# Patient Record
Sex: Female | Born: 1955 | Race: Black or African American | Hispanic: No | Smoking: Never smoker
Health system: Southern US, Community
[De-identification: ages and names within clinical notes are randomized; demographics above are authoritative.]

## PROBLEM LIST (undated history)

## (undated) DIAGNOSIS — G40909 Epilepsy, unspecified, not intractable, without status epilepticus: Secondary | ICD-10-CM

## (undated) DIAGNOSIS — G5600 Carpal tunnel syndrome, unspecified upper limb: Secondary | ICD-10-CM

## (undated) DIAGNOSIS — F329 Major depressive disorder, single episode, unspecified: Secondary | ICD-10-CM

## (undated) DIAGNOSIS — I69359 Hemiplegia and hemiparesis following cerebral infarction affecting unspecified side: Secondary | ICD-10-CM

## (undated) DIAGNOSIS — D259 Leiomyoma of uterus, unspecified: Secondary | ICD-10-CM

## (undated) DIAGNOSIS — R269 Unspecified abnormalities of gait and mobility: Secondary | ICD-10-CM

## (undated) DIAGNOSIS — R413 Other amnesia: Secondary | ICD-10-CM

## (undated) DIAGNOSIS — F32A Depression, unspecified: Secondary | ICD-10-CM

## (undated) DIAGNOSIS — K2970 Gastritis, unspecified, without bleeding: Secondary | ICD-10-CM

## (undated) DIAGNOSIS — R569 Unspecified convulsions: Secondary | ICD-10-CM

## (undated) DIAGNOSIS — D509 Iron deficiency anemia, unspecified: Secondary | ICD-10-CM

## (undated) DIAGNOSIS — D649 Anemia, unspecified: Secondary | ICD-10-CM

## (undated) DIAGNOSIS — E785 Hyperlipidemia, unspecified: Secondary | ICD-10-CM

## (undated) DIAGNOSIS — I10 Essential (primary) hypertension: Secondary | ICD-10-CM

## (undated) DIAGNOSIS — IMO0002 Reserved for concepts with insufficient information to code with codable children: Secondary | ICD-10-CM

## (undated) DIAGNOSIS — K449 Diaphragmatic hernia without obstruction or gangrene: Secondary | ICD-10-CM

## (undated) DIAGNOSIS — I639 Cerebral infarction, unspecified: Secondary | ICD-10-CM

## (undated) HISTORY — PX: TUBAL LIGATION: SHX77

## (undated) HISTORY — PX: TRACHEOSTOMY CLOSURE: SHX458

## (undated) HISTORY — DX: Iron deficiency anemia, unspecified: D50.9

## (undated) HISTORY — DX: Major depressive disorder, single episode, unspecified: F32.9

## (undated) HISTORY — DX: Essential (primary) hypertension: I10

## (undated) HISTORY — DX: Hemiplegia and hemiparesis following cerebral infarction affecting unspecified side: I69.359

## (undated) HISTORY — DX: Carpal tunnel syndrome, unspecified upper limb: G56.00

## (undated) HISTORY — DX: Unspecified abnormalities of gait and mobility: R26.9

## (undated) HISTORY — DX: Gastritis, unspecified, without bleeding: K29.70

## (undated) HISTORY — DX: Other amnesia: R41.3

## (undated) HISTORY — PX: TRACHEOSTOMY: SUR1362

## (undated) HISTORY — DX: Depression, unspecified: F32.A

## (undated) HISTORY — DX: Hyperlipidemia, unspecified: E78.5

## (undated) HISTORY — DX: Leiomyoma of uterus, unspecified: D25.9

## (undated) HISTORY — DX: Epilepsy, unspecified, not intractable, without status epilepticus: G40.909

## (undated) HISTORY — DX: Anemia, unspecified: D64.9

## (undated) HISTORY — DX: Cerebral infarction, unspecified: I63.9

## (undated) HISTORY — DX: Diaphragmatic hernia without obstruction or gangrene: K44.9

## (undated) HISTORY — DX: Reserved for concepts with insufficient information to code with codable children: IMO0002

---
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
## 1982-04-27 HISTORY — PX: TUBAL LIGATION: SHX77

## 1988-03-27 DIAGNOSIS — I639 Cerebral infarction, unspecified: Secondary | ICD-10-CM

## 1988-03-27 HISTORY — DX: Cerebral infarction, unspecified: I63.9

## 2005-02-19 ENCOUNTER — Inpatient Hospital Stay (HOSPITAL_COMMUNITY): Admission: EM | Admit: 2005-02-19 | Discharge: 2005-02-19 | Payer: Self-pay | Admitting: Emergency Medicine

## 2006-04-27 DIAGNOSIS — D509 Iron deficiency anemia, unspecified: Secondary | ICD-10-CM

## 2006-04-27 HISTORY — PX: COLONOSCOPY: SHX174

## 2006-04-27 HISTORY — DX: Iron deficiency anemia, unspecified: D50.9

## 2006-04-27 HISTORY — PX: UPPER GASTROINTESTINAL ENDOSCOPY: SHX188

## 2006-12-01 ENCOUNTER — Ambulatory Visit: Payer: Self-pay | Admitting: Internal Medicine

## 2007-03-11 ENCOUNTER — Ambulatory Visit: Payer: Self-pay | Admitting: Internal Medicine

## 2007-03-22 ENCOUNTER — Encounter: Payer: Self-pay | Admitting: Internal Medicine

## 2007-03-22 ENCOUNTER — Ambulatory Visit (HOSPITAL_COMMUNITY): Admission: RE | Admit: 2007-03-22 | Discharge: 2007-03-22 | Payer: Self-pay | Admitting: Internal Medicine

## 2007-03-22 DIAGNOSIS — K644 Residual hemorrhoidal skin tags: Secondary | ICD-10-CM | POA: Insufficient documentation

## 2007-03-22 DIAGNOSIS — K449 Diaphragmatic hernia without obstruction or gangrene: Secondary | ICD-10-CM | POA: Insufficient documentation

## 2007-03-28 ENCOUNTER — Ambulatory Visit: Payer: Self-pay | Admitting: Internal Medicine

## 2007-03-31 ENCOUNTER — Ambulatory Visit (HOSPITAL_COMMUNITY): Admission: RE | Admit: 2007-03-31 | Discharge: 2007-03-31 | Payer: Self-pay | Admitting: Obstetrics

## 2007-06-09 DIAGNOSIS — E785 Hyperlipidemia, unspecified: Secondary | ICD-10-CM

## 2007-06-09 DIAGNOSIS — K2990 Gastroduodenitis, unspecified, without bleeding: Secondary | ICD-10-CM

## 2007-06-09 DIAGNOSIS — I10 Essential (primary) hypertension: Secondary | ICD-10-CM

## 2007-06-09 DIAGNOSIS — D500 Iron deficiency anemia secondary to blood loss (chronic): Secondary | ICD-10-CM

## 2007-06-09 DIAGNOSIS — Z8673 Personal history of transient ischemic attack (TIA), and cerebral infarction without residual deficits: Secondary | ICD-10-CM

## 2007-06-09 DIAGNOSIS — K2970 Gastritis, unspecified, without bleeding: Secondary | ICD-10-CM | POA: Insufficient documentation

## 2008-04-02 ENCOUNTER — Ambulatory Visit (HOSPITAL_COMMUNITY): Admission: RE | Admit: 2008-04-02 | Discharge: 2008-04-02 | Payer: Self-pay | Admitting: Internal Medicine

## 2009-09-25 ENCOUNTER — Ambulatory Visit (HOSPITAL_COMMUNITY): Admission: RE | Admit: 2009-09-25 | Discharge: 2009-09-25 | Payer: Self-pay | Admitting: Geriatric Medicine

## 2009-10-15 ENCOUNTER — Encounter: Admission: RE | Admit: 2009-10-15 | Discharge: 2009-10-15 | Payer: Self-pay | Admitting: Geriatric Medicine

## 2010-03-22 ENCOUNTER — Encounter: Payer: Self-pay | Admitting: Emergency Medicine

## 2010-03-23 ENCOUNTER — Emergency Department (HOSPITAL_COMMUNITY)
Admission: EM | Admit: 2010-03-23 | Discharge: 2010-03-23 | Payer: Self-pay | Source: Home / Self Care | Admitting: Emergency Medicine

## 2010-03-23 ENCOUNTER — Observation Stay (HOSPITAL_COMMUNITY)
Admission: EM | Admit: 2010-03-23 | Discharge: 2010-03-25 | Payer: Self-pay | Source: Home / Self Care | Admitting: Emergency Medicine

## 2010-05-18 ENCOUNTER — Encounter: Payer: Self-pay | Admitting: Geriatric Medicine

## 2010-05-18 ENCOUNTER — Encounter: Payer: Self-pay | Admitting: Obstetrics

## 2010-07-08 LAB — CBC
HCT: 30.5 % — ABNORMAL LOW (ref 36.0–46.0)
HCT: 33 % — ABNORMAL LOW (ref 36.0–46.0)
HCT: 36.1 % (ref 36.0–46.0)
Hemoglobin: 10.2 g/dL — ABNORMAL LOW (ref 12.0–15.0)
Hemoglobin: 10.6 g/dL — ABNORMAL LOW (ref 12.0–15.0)
Hemoglobin: 11.3 g/dL — ABNORMAL LOW (ref 12.0–15.0)
Hemoglobin: 12.3 g/dL (ref 12.0–15.0)
MCH: 31.5 pg (ref 26.0–34.0)
MCH: 32.1 pg (ref 26.0–34.0)
MCH: 32.1 pg (ref 26.0–34.0)
MCH: 32.4 pg (ref 26.0–34.0)
MCHC: 33.3 g/dL (ref 30.0–36.0)
MCHC: 34 g/dL (ref 30.0–36.0)
MCHC: 34 g/dL (ref 30.0–36.0)
MCHC: 34.1 g/dL (ref 30.0–36.0)
MCV: 94.3 fL (ref 78.0–100.0)
MCV: 94.5 fL (ref 78.0–100.0)
MCV: 94.5 fL (ref 78.0–100.0)
Platelets: ADEQUATE 10*3/uL (ref 150–400)
Platelets: ADEQUATE 10*3/uL (ref 150–400)
RBC: 3.22 MIL/uL — ABNORMAL LOW (ref 3.87–5.11)
RBC: 3.51 MIL/uL — ABNORMAL LOW (ref 3.87–5.11)
RBC: 3.82 MIL/uL — ABNORMAL LOW (ref 3.87–5.11)
RDW: 12.7 % (ref 11.5–15.5)
RDW: 12.8 % (ref 11.5–15.5)
RDW: 12.8 % (ref 11.5–15.5)
RDW: 12.9 % (ref 11.5–15.5)
WBC: 13.6 10*3/uL — ABNORMAL HIGH (ref 4.0–10.5)
WBC: 14.8 10*3/uL — ABNORMAL HIGH (ref 4.0–10.5)
WBC: 19.1 10*3/uL — ABNORMAL HIGH (ref 4.0–10.5)

## 2010-07-08 LAB — CULTURE, BLOOD (ROUTINE X 2)
Culture  Setup Time: 201111271725
Culture  Setup Time: 201111271725
Culture: NO GROWTH
Culture: NO GROWTH

## 2010-07-08 LAB — BASIC METABOLIC PANEL
BUN: 11 mg/dL (ref 6–23)
BUN: 11 mg/dL (ref 6–23)
BUN: 13 mg/dL (ref 6–23)
BUN: 9 mg/dL (ref 6–23)
CO2: 26 mEq/L (ref 19–32)
CO2: 27 mEq/L (ref 19–32)
CO2: 28 mEq/L (ref 19–32)
CO2: 29 mEq/L (ref 19–32)
Calcium: 8.5 mg/dL (ref 8.4–10.5)
Calcium: 8.5 mg/dL (ref 8.4–10.5)
Calcium: 8.7 mg/dL (ref 8.4–10.5)
Calcium: 8.9 mg/dL (ref 8.4–10.5)
Chloride: 103 mEq/L (ref 96–112)
Chloride: 103 mEq/L (ref 96–112)
Chloride: 103 mEq/L (ref 96–112)
Creatinine, Ser: 0.73 mg/dL (ref 0.4–1.2)
Creatinine, Ser: 0.79 mg/dL (ref 0.4–1.2)
Creatinine, Ser: 0.79 mg/dL (ref 0.4–1.2)
Creatinine, Ser: 0.84 mg/dL (ref 0.4–1.2)
GFR calc Af Amer: >60 mL/min (ref >60)
GFR calc Af Amer: >60 mL/min (ref >60)
GFR calc Af Amer: >60 mL/min (ref >60)
GFR calc non Af Amer: >60 mL/min (ref >60)
GFR calc non Af Amer: >60 mL/min (ref >60)
GFR calc non Af Amer: >60 mL/min (ref >60)
Glucose, Bld: 110 mg/dL — ABNORMAL HIGH (ref 70–99)
Glucose, Bld: 124 mg/dL — ABNORMAL HIGH (ref 70–99)
Glucose, Bld: 124 mg/dL — ABNORMAL HIGH (ref 70–99)
Glucose, Bld: 134 mg/dL — ABNORMAL HIGH (ref 70–99)
Potassium: 3 mEq/L — ABNORMAL LOW (ref 3.5–5.1)
Potassium: 3 mEq/L — ABNORMAL LOW (ref 3.5–5.1)
Potassium: 3.4 mEq/L — ABNORMAL LOW (ref 3.5–5.1)
Sodium: 138 mEq/L (ref 135–145)
Sodium: 138 mEq/L (ref 135–145)
Sodium: 140 mEq/L (ref 135–145)

## 2010-07-08 LAB — LIPID PANEL
Cholesterol: 115 mg/dL (ref 0–200)
HDL: 43 mg/dL (ref >39)
LDL Cholesterol: 59 mg/dL (ref 0–99)
Total CHOL/HDL Ratio: 2.7 RATIO
Triglycerides: 65 mg/dL (ref <150)

## 2010-07-08 LAB — TYPE AND SCREEN
ABO/RH(D): A NEG
Antibody Screen: NEGATIVE

## 2010-07-08 LAB — URINALYSIS, ROUTINE W REFLEX MICROSCOPIC
Bilirubin Urine: NEGATIVE
Glucose, UA: NEGATIVE mg/dL
Ketones, ur: NEGATIVE mg/dL
Leukocytes, UA: NEGATIVE
Nitrite: NEGATIVE
Protein, ur: NEGATIVE mg/dL
Specific Gravity, Urine: >1.046 — ABNORMAL HIGH (ref 1.005–1.030)
Urobilinogen, UA: 0.2 mg/dL (ref 0.0–1.0)
pH: 6 (ref 5.0–8.0)

## 2010-07-08 LAB — URINE MICROSCOPIC-ADD ON

## 2010-07-08 LAB — ABO/RH: ABO/RH(D): A NEG

## 2010-07-08 LAB — MRSA PCR SCREENING: MRSA by PCR: NEGATIVE

## 2010-09-09 NOTE — Assessment & Plan Note (Signed)
[facility]                         GASTROENTEROLOGY OFFICE NOTE   NAME:HARRISLiliani, Bobo                          MRN:          [id number]  DATE:12/01/2006                            DOB:          [birth year]    CHIEF COMPLAINT:  Iron deficiency anemia.   ASSESSMENT:  Iron deficiency anemia without clear evidence of GI  bleeding at this time. Her hemoglobin is 10.1 with an MCV of 81.  Iron  is 25 with a percent saturation of 4%, TIBC 347. Etiologies could be GI  blood loss, she has menstrual periods, and that is probably most likely.  Note that her ferritin is 7. This patient reports that she had a  colonoscopy at [facility] two years ago. We have requested  records, but they say that they have evidence that she had a colonoscopy  or an endoscopy.   The reliability of the history is not entirely clear to me. 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 She has had a stroke. She seemed to be mentally intact  otherwise however.   RECOMMENDATIONS AND PLAN:  1. A colonoscopy could be indicated, but if she really had one two      years ago it probably is not.  2. Home hemoccults are ordered. We will await the results of them to      determine. If they are positive then I do think a workup is going      to be necessary.  3. We will ask Dr. Merilynn Finland if there are records about her      colonoscopy in his office files or at the nursing home.  4. Further plans is pending the above.   HISTORY:  As above. She denies any particular symptoms. This lady was  living in a nursing home in [place] and then moved here to be closer to  her family. She had a stroke about 10 years ago. Stools have been  somewhat dark on iron. She has been a little constipated with that. She  uses omeprazole to control heartburn.   PAST MEDICAL HISTORY:  1. Hypertension.  2. Dyslipidemia.  3. Previous stroke.  4. She had  a tracheostomy at one point.  5. Tubal ligation.  6. Some sort of benign breast surgery.  7. Otherwise, as above.   FAMILY HISTORY:  See medical history form.   SOCIAL HISTORY:  See medical history form.   REVIEW OF SYSTEMS:  See medical history form.   Of note, she is divorced. She is a disabled Runner, broadcasting/film/video. She is in a  wheelchair, though she can walk some.   MEDICATIONS:  Listed and reviewed on the chart.   PHYSICAL EXAMINATION:  GENERAL:  Middle aged black woman in no acute  distress.  VITAL SIGNS:  Height 5 foot 7.5 inches, blood pressure 120/70, pulse 84.  EYES:  Anicteric.  NECK:  Supple.  CHEST:  Clear.  HEART:  S1, S2. No murmurs or gallops.  ABDOMEN:  Soft and nontender. No organomegaly or mass.  LOWER EXTREMITIES:  Free of edema. There is a brace on the left lower  extremity.  NEUROLOGIC:  She appears alert and oriented x3.   DATA REVIEWED:  As described above.   I appreciate the opportunity to care for this patient.     Iva Boop, MD,FACG  Electronically Signed    CEG/MedQ  DD: 12/03/2006  DT: 12/04/2006  Job #: [id number]   cc:   Maxwell Caul, M.D.

## 2010-09-09 NOTE — Assessment & Plan Note (Signed)
[name] [name]                         GASTROENTEROLOGY OFFICE NOTE   NAME:[name], [name]                          MRN:          [id number]  DATE:03/11/2007                            DOB:          1955-10-30    CHIEF COMPLAINT:  Reflux.   When she eats spicy foods, despite Prilosec OTC 20 mg a day she is  having persistent problems and is using some Tums as well.  I had seen  her in the summer and we had requested records of a colonoscopy.  She  says she had in [place], but we received records information stating that  there was no record of that at [facility].  She has iron-deficiency  anemia and has been on iron.  Hemoccults were not returned.  There were  no significant bowel symptoms at this time.  And, she is not complaining  of any dysphagia.   PAST MEDICAL HISTORY:  1. Hypertension.  2. Dyslipidemia.  3. Prior stroke.  She is in a wheelchair much of the time, uses a      walker, has leg braces.  4. Prior tracheostomy.  5. Prior tubal ligation.  6. Benign breast surgery.  7. Iron-deficiency anemia.  8. Still menstruating.   MEDICATIONS:  1. Dilantin 400 mg daily.  2. Aspirin 325 mg daily.  3. Maxzide 25 mg daily.  4. Tolterodine daily.  5. Omeprazole 20 mg daily.  6. Norvasc 2.5 mg daily.  7. Colace 100 mg daily.  8. Os-Cal 500 mg b.i.d.  9. Ferrous sulfate b.i.d.  10.Lipitor 40 mg daily.  11.Lotrisone cream 1% b.i.d.  12.Tums 2 b.i.d.   DRUG ALLERGIES:  1. PENICILLIN.  2. AMOXICILLIN.  3. KEFLEX.   PHYSICAL EXAMINATION:  GENERAL:  Wheelchair bound.  VITAL SIGNS:  Estimated weight 212 pounds, pulse 86, blood pressure  158/98, height 5 feet 7.5 inches.   ASSESSMENT:  1. Reflux symptoms not controlled by Prilosec and Tums, could be from      diet.  She is obese which is a risk factor as well.  2. Iron-deficiency anemia.  I do not think I have an adequate      explanation though it could be menstrual in origin without the     records of her prior colonoscopy, I cannot be adequately certain      that there is not a significant lower gastrointestinal tract      lesion.   PLAN:  1. Schedule EGD and colonoscopy and investigate the reflux and the      iron-deficiency anemia.  Risks, benefits, and indications including      perforation of the gastrointestinal tract are described as well as      possibly needing surgery.  She understands the risks and agrees to      proceed.  2. We will increase her Prilosec to 40 mg in the morning.   Further plans pending this.     Iva Boop, MD,FACG  Electronically Signed    CEG/MedQ  DD: 03/11/2007  DT: 03/11/2007  Job #: [id number]   cc:   [name].  Leanord Hawking, M.D.  [place] [facility] [employer]

## 2010-09-12 NOTE — Op Note (Signed)
NAMEZAYANA, [name] [name] NO.:  [id number]   MEDICAL RECORD NO.:  [id number]          PATIENT TYPE:  INP   LOCATION:  2550                         FACILITY:  [facility]   PHYSICIAN:  Nadara Mustard, MD     DATE OF BIRTH:  1955/09/21   DATE OF PROCEDURE:  02/19/2005  DATE OF DISCHARGE:                                 OPERATIVE REPORT   PREOP DIAGNOSIS:  Dislocated left shoulder failed closed reduction in the  ER.   POSTOP DIAGNOSIS:  Dislocated left shoulder failed closed reduction in the  ER.   PROCEDURE:  Closed reduction left shoulder in the OR.   SURGEON:  Nadara Mustard, MD   ANESTHESIA:  General.   ESTIMATED BLOOD LOSS:  Minimal.   ANTIBIOTICS:  None.   DRAINS:  None.   COMPLICATIONS:  None.   DISPOSITION:  To PACU in stable condition.   INDICATIONS FOR PROCEDURE:  The patient is a 55 year old woman who underwent  conscious sedation in the emergency room and underwent closed reduction. The  patient had failure of closed reduction and presents to the OR, at this  time, for closed reduction under general anesthesia. The patient, and her  daughter were discussed with the risks and benefits of surgery including  recurrent dislocation, neurovascular injury. The patient and her daughter  state that they understand and wish to proceed at this time.   DESCRIPTION OF PROCEDURE:  The patient was brought to OR room #1 and  underwent a general anesthetic. After an adequate level of anesthesia  obtained, the patient's left shoulder was reduced with C-arm fluoroscopy  used to verify reduction. Axillary views showed a congruent joint; AP views  showed a congruent joint. The patient was placed in a sling. She was  extubated, taken to PACU in stable condition. The patient states that she  will return to [facility] and will follow up with orthopedics in [place].      Nadara Mustard, MD  Electronically Signed    MVD/MEDQ  D:  02/19/2005  T:  02/20/2005  Job:   [id number]

## 2010-09-12 NOTE — Consult Note (Signed)
NAME:  [name] [name], [name] [name] NO.:  [id number]   MEDICAL RECORD NO.:  [id number]          PATIENT TYPE:  INP   LOCATION:  2550                         FACILITY:  [facility]   PHYSICIAN:  Nadara Mustard, MD     DATE OF BIRTH:  12-Oct-1955   DATE OF CONSULTATION:  02/19/2005  DATE OF DISCHARGE:                                   CONSULTATION   HISTORY OF PRESENT ILLNESS:  The patient is a 55 year old woman with a  history of a stroke with involvement of both upper and both lower  extremities. She currently ambulates with a AFOs and a walker. AFOs are  bilaterally. The patient states she was getting out of the shower, was  falling and grabbed with her left arm twisted and dislocated her left  shoulder. The patient's past medical history is significant for history of  seizures, stroke, which she states has involved both upper and lower  extremities, hypertension, and high cholesterol. The patient states she is  currently on medication for the seizures, high cholesterol and hypertension.   ALLERGIES:  No known drug allergies.   OBJECTIVE EXAMINATION:  The patient has inability to move the left shoulder.  She has pain with attempted internal or external rotation. Both hands are  neurovascular intact. Examination of both lower extremities:  She has AFOs  on bilaterally and has essentially no plantar flexion or dorsiflexion of  either foot and this is unchanged from her normal exam.   Radiograph shows an anterior dislocation of the left shoulder. No evidence  of a Hill-Sacks lesion.   ASSESSMENT:  Anterior shoulder dislocation, left shoulder in a patient with  a history of stroke, seizures, hypertension, and high cholesterol.   PLAN:  After informed consent, the patient underwent conscious sedation. She  received 50 milligrams of fentanyl and 5 milligrams of Versed. She then  underwent a closed reduction without difficulty. The patient had regained  her range of motion. The patient  was then given 0.5 milligrams midazolam.  She responded well. Her O2 saturations were never below 99% and she states  the shoulder felt as good as it ever did. Postreduction radiographs were  obtained. She was given a prescription for Vicodin for pain. The patient  states that she lives in [place]. She was visiting her daughter and states  that she will follow-up with orthopedics in [place]. She was placed in a  sling and nonweightbearing on the left upper extremity and she will follow  up in [place] in two weeks.      Nadara Mustard, MD  Electronically Signed     MVD/MEDQ  D:  02/19/2005  T:  02/20/2005  Job:  [id number]

## 2010-09-12 NOTE — Letter (Signed)
December 03, 2006    Maxwell Caul, M.D.  [address]  [place], [place] [address]   RE:  CLAUDENE, GATLIFF  MRN:  [id number]  /  DOB:  05/21/1953   Dear Casimiro Needle:   I saw Ms. Mckiddy in the office the other day. She has iron deficiency  anemia. She tells me that she had a colonoscopy at [facility] 2 years ago but they had no records of that when we requested  it.   If she indeed had one 2 years ago and is Hemoccult negative, I am not  sure if she needs further GI evaluation, other than an upper endoscopy,  maybe.   I am checking Hemoccult's. If they are positive, we will proceed with an  endoscopic evaluation. However, if you have any records of a  colonoscopy, endoscopy, or can help [place] figure out where she had this  (she does not seem to be a reliable historian), that would be most  useful. Thanks    Sincerely,      Iva Boop, MD,FACG  Electronically Signed    CEG/MedQ  DD: 12/03/2006  DT: 12/04/2006  Job #: [id number]

## 2011-06-22 ENCOUNTER — Encounter: Payer: Self-pay | Admitting: Internal Medicine

## 2011-07-13 ENCOUNTER — Encounter: Payer: Self-pay | Admitting: *Deleted

## 2011-07-14 ENCOUNTER — Ambulatory Visit (INDEPENDENT_AMBULATORY_CARE_PROVIDER_SITE_OTHER): Payer: PRIVATE HEALTH INSURANCE | Admitting: Internal Medicine

## 2011-07-14 ENCOUNTER — Encounter: Payer: Self-pay | Admitting: Internal Medicine

## 2011-07-14 DIAGNOSIS — K5900 Constipation, unspecified: Secondary | ICD-10-CM

## 2011-07-14 DIAGNOSIS — Z1211 Encounter for screening for malignant neoplasm of colon: Secondary | ICD-10-CM

## 2011-07-14 MED ORDER — POLYETHYLENE GLYCOL 3350 17 GM/SCOOP PO POWD: 17.0000 g | Freq: Every day | ORAL | Status: AC

## 2011-07-14 NOTE — Patient Instructions (Signed)
Start taking Miralax 17grams one dose per day.

## 2011-07-14 NOTE — Progress Notes (Signed)
Subjective:    Patient ID: [name] [name], female    DOB: 12/29/1955, 56 y.o.   MRN: [id number]  HPI This is a pleasant 56 year old African American woman, she resides in [facility] in rehabilitation. 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 I had performed a colonoscopy in 2008, the prep was fair, a repeat colonoscopy was recommended for about 5 years because of the fair prep. She moves her bowels about every 3-4 days. She does not walk much at all. She is a wheelchair today. She has no specific GI complaints today denies seeing any bleeding in the stool and we have no known history of Hemoccult-positive stools. She takes iron chronically  She believes the iron causes some constipation. Allergies  Allergen Reactions  . Amoxicillin   . Keflex   . Penicillins    No outpatient prescriptions prior to visit.   Past Medical History  Diagnosis Date  . Uterine fibroid   . Gastritis   . Hiatal hernia   . Hemorrhoids   . Anemia   . Stroke 12 1989  . Dyslipidemia   . Hypertension   . Seizure disorder   . Carpal tunnel syndrome   . Depression   . Osteoporosis   . Urinary incontinence   . Iron deficiency anemia 2008    negative EGD and colonoscopy   Past Surgical History  Procedure Date  . Colonoscopy 2008  . Upper gastrointestinal endoscopy 2008  . Tracheostomy   . Breast surgery 1977  . Tubal ligation   . Cesarean section     x2   History   Social History  . Marital Status: Divorced    Spouse Name: N/A    Number of Children: 2  . Years of Education: N/A   Occupational History  . Disabled    Social History Main Topics  . Smoking status: Never Smoker   . Smokeless tobacco: Never Used  . Alcohol Use: No  . Drug Use: No  . Sexually Active: None   Other Topics Concern  . None   Social History Narrative   Size and skilled nursing facility secondary to multiple previous strokes and lack of family to care for her at home   Family History  Problem Relation Age of  Onset  . Heart disease Paternal Grandfather   . Prostate cancer Paternal Uncle   . Ovarian cancer Mother   . Diabetes Cousin   . Colon cancer Neg Hx         Review of Systems Positive for decreased hearing, muscle and joint pains, pedal edema.    Objective:   Physical Exam General:  NAD - obese, in wheelchair Eyes:   anicteric Lungs:  clear Heart:  S1S2 no rubs, murmurs or gallops Abdomen:  Soft, obese, nontender Ext:   Ankle splints bilaterally Psych:  Appropriate mood and affect    Data Reviewed:   November 2008 EGD showed a 5 cm hiatal hernia and mild gastritis. Colonoscopy then showed external hemorrhoids. Extensive flushing was required to achieve a fair prep.        Assessment & Plan:   1. Constipation   2. Special screening for malignant neoplasms, colon    She has chronic constipation issues, not surprising given her debilitated state and medications. I am going to have her start MiraLax daily in addition to the other agents she takes. If she were to have a repeat colonoscopy she should have a better bowel habit. I will have her come back in  2 months to review options again, we did discuss Hemoccult testing on an annual basis versus colonoscopy. We'll see if we can have her get a colonoscopy. It will have to be performed at the hospital due to her comorbidities and the debilitated state. We did review the risks benefits and indications today and she is willing to consider having one. We also talked about annual Hemoccult testing. I want to make sure she has a better prep if we do at this time. I had originally planned to do it on a routine basis if health status permitted around November 2013.  I will copy Dr. Murray Hodgkins, and [facility]

## 2011-10-14 ENCOUNTER — Emergency Department (HOSPITAL_COMMUNITY)
Admission: EM | Admit: 2011-10-14 | Discharge: 2011-10-14 | Disposition: A | Discharge status: Home / Self Care | Payer: Medicare PPO | Attending: Emergency Medicine | Admitting: Emergency Medicine

## 2011-10-14 ENCOUNTER — Encounter (HOSPITAL_COMMUNITY): Payer: Self-pay | Admitting: *Deleted

## 2011-10-14 DIAGNOSIS — Z8673 Personal history of transient ischemic attack (TIA), and cerebral infarction without residual deficits: Secondary | ICD-10-CM | POA: Insufficient documentation

## 2011-10-14 DIAGNOSIS — E785 Hyperlipidemia, unspecified: Secondary | ICD-10-CM | POA: Insufficient documentation

## 2011-10-14 DIAGNOSIS — G40909 Epilepsy, unspecified, not intractable, without status epilepticus: Secondary | ICD-10-CM | POA: Insufficient documentation

## 2011-10-14 DIAGNOSIS — I10 Essential (primary) hypertension: Secondary | ICD-10-CM | POA: Insufficient documentation

## 2011-10-14 DIAGNOSIS — Z79899 Other long term (current) drug therapy: Secondary | ICD-10-CM | POA: Insufficient documentation

## 2011-10-14 DIAGNOSIS — R569 Unspecified convulsions: Secondary | ICD-10-CM

## 2011-10-14 MED ORDER — LEVETIRACETAM 500 MG/5ML IV SOLN
500.0000 mg | Freq: Once | INTRAVENOUS | Status: DC
  Filled 2011-10-14: qty 5

## 2011-10-14 MED ORDER — ONDANSETRON 8 MG PO TBDP
8.0000 mg | ORAL_TABLET | Freq: Once | ORAL | Status: AC
  Administered 2011-10-14: 8 mg via ORAL

## 2011-10-14 MED ORDER — ONDANSETRON 8 MG PO TBDP
ORAL_TABLET | ORAL | Status: AC
  Administered 2011-10-14: 8 mg via ORAL
  Filled 2011-10-14: qty 1

## 2011-10-14 MED ORDER — LEVETIRACETAM 500 MG PO TABS: 500.0000 mg | ORAL_TABLET | Freq: Two times a day (BID) | ORAL | Status: DC

## 2011-10-14 MED ORDER — LEVETIRACETAM 500 MG PO TABS
500.0000 mg | ORAL_TABLET | Freq: Once | ORAL | Status: AC
  Administered 2011-10-14: 500 mg via ORAL
  Filled 2011-10-14: qty 1

## 2011-10-14 MED ORDER — FENTANYL CITRATE 0.05 MG/ML IJ SOLN
INTRAMUSCULAR | Status: AC
  Filled 2011-10-14: qty 2

## 2011-10-14 NOTE — ED Notes (Signed)
Per EMS:  Pt had a witness seizure lasting 30-45 minutes.  Pt is a pt in a rehab facility and they adjusted her meds, she was on Keppra and they took her off.  This is her 2nd seizure since June 1st, she has been off the Keppra since before June 1st.  Alert and oriented x4.  Seizure took place in bed, pt didn't fall, no other injuries or complaints.

## 2011-10-14 NOTE — ED Provider Notes (Signed)
History     CSN: [id number]  Arrival date & time 10/14/11  0144   First MD Initiated Contact with Patient 10/14/11 [phone]      Chief Complaint  Patient presents with  . Seizure     (Consider location/radiation/quality/duration/timing/severity/associated sxs/prior treatment) HPI This is a 56 year old black female with a history of stroke and seizure disorder. She was taken off her Keppra sometime before this month. Since then she has had 2 seizures including the seizure she had this morning. Specifically she had a generalized tonic-clonic seizure lasting about 30-45 minutes. This occurred just after midnight. She denies biting her tongue. She was not incontinent. She states she feels good but somewhat sleepy. She does not know why she was taken off Keppra. She did not have seizures prior to her stroke. 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  Past Medical History  Diagnosis Date  . Uterine fibroid   . Gastritis   . Hiatal hernia   . Hemorrhoids   . Anemia   . Stroke 12 1989  . Dyslipidemia   . Hypertension   . Seizure disorder   . Carpal tunnel syndrome   . Depression   . Osteoporosis   . Urinary incontinence   . Iron deficiency anemia 2008    negative EGD and colonoscopy    Past Surgical History  Procedure Date  . Colonoscopy 2008  . Upper gastrointestinal endoscopy 2008  . Tracheostomy   . Breast surgery 1977  . Tubal ligation   . Cesarean section     x2    Family History  Problem Relation Age of Onset  . Heart disease Paternal Grandfather   . Prostate cancer Paternal Uncle   . Ovarian cancer Mother   . Diabetes Cousin   . Colon cancer Neg Hx     History  Substance Use Topics  . Smoking status: Never Smoker   . Smokeless tobacco: Never Used  . Alcohol Use: No    OB History    Grav Para Term Preterm Abortions TAB SAB Ect Mult Living                  Review of Systems  All other systems reviewed and are negative.    Allergies  Amoxicillin;  Cephalexin; and Penicillins  Home Medications   Current Outpatient Rx  Name Route Sig Dispense Refill  . ASPIRIN EC 81 MG PO TBEC Oral Take 81 mg by mouth daily.    . ATENOLOL 25 MG PO TABS Oral Take 12.5 mg by mouth daily.    . ATORVASTATIN CALCIUM 40 MG PO TABS Oral Take 40 mg by mouth daily.    Marland Kitchen LISINOPRIL 10 MG PO TABS Oral Take 10 mg by mouth daily.    Marland Kitchen METFORMIN HCL 500 MG PO TABS Oral Take 500 mg by mouth daily.    Marland Kitchen OMEPRAZOLE 40 MG PO CPDR Oral Take 40 mg by mouth daily.    . TRIAMTERENE-HCTZ 37.5-25 MG PO TABS Oral Take 1.5 tablets by mouth daily.      BP 96/45  Pulse 88  Temp 98.3 F (36.8 C) (Oral)  Resp 18  SpO2 87%  Physical Exam General: Well-developed, well-nourished female in no acute distress; appearance consistent with age of record HENT: normocephalic, atraumatic; no bite marks of the Eyes: pupils equal round and reactive to light; extraocular muscles intact Neck: supple Heart: regular rate and rhythm Lungs: clear to auscultation bilaterally Abdomen: soft; nondistended Extremities: No deformity; limited range of motion of  lower extremity Neurologic: Awake, alert and oriented; paraparesis; no facial droop Skin: Warm and dry Psychiatric: Normal mood and affect    ED Course  Procedures (including critical care time)     MDM  We'll restart Keppra.         Carlisle Beers Elchanan Bob, MD 10/14/11 0600

## 2011-10-14 NOTE — ED Notes (Signed)
Lauren (pt's daughter) :  [phone]

## 2011-10-14 NOTE — ED Notes (Signed)
Pt lives with daughter, her primary care giver.  Pt was had residual deficits from a previous stroke about 20 years ago.  Pt was falling and sent to an assisted living/rehab center.  Apparently while there pt was taken off her Keppra, unbeknownst to pt and pt's daughter.  Ever since then pt has had 2 seizures.

## 2011-10-14 NOTE — ED Notes (Signed)
Attempted to obtain IV access, unsuccessful.  Will attempt again shortly.

## 2011-10-14 NOTE — ED Notes (Signed)
Pt's 02 dropped to 87 on RA, placed pt on 2L [place].

## 2011-10-14 NOTE — ED Notes (Signed)
[id number]<AV> Expected date:<BR> Expected time:<BR> Means of arrival:<BR> Comments:<BR> EMS-seizure

## 2011-11-12 ENCOUNTER — Encounter: Payer: Medicare PPO | Attending: Family Medicine | Admitting: *Deleted

## 2011-11-12 ENCOUNTER — Encounter: Payer: Self-pay | Admitting: *Deleted

## 2011-11-12 DIAGNOSIS — E119 Type 2 diabetes mellitus without complications: Secondary | ICD-10-CM | POA: Insufficient documentation

## 2011-11-12 DIAGNOSIS — Z713 Dietary counseling and surveillance: Secondary | ICD-10-CM | POA: Insufficient documentation

## 2011-11-12 NOTE — Patient Instructions (Signed)
Goals:  Follow Diabetes Meal Plan as instructed  Eat 3 meals and 2 snacks, every 3-5 hrs  Limit carbohydrate intake to 30-45 grams carbohydrate/meal  Limit carbohydrate intake to 0-15 grams carbohydrate/snack  Add lean protein foods to meals/snacks  Monitor glucose levels as instructed by your doctor  Aim for 15 mins of physical activity daily as tolerated; armchair exercises and leg stretches while in bed  Bring food record and glucose log to your next nutrition visit

## 2011-11-12 NOTE — Progress Notes (Signed)
  Medical Nutrition Therapy:  Appt start time: 1500 end time:  1600.  Assessment:  Primary concerns today: patient here for diabetes education. She states she was diagnosed about 2 years ago but has not yet received diabetes education. She states she is currently living with her daughter and 54 year old grandson after being discharged from Nursing Home in [place]. She eats the breakfast her daughter prepares and frozen meals for lunch and supper. She enjoys fruit and yogurt for snacks. She is wheel chair bound so no physical exercise is possible, she states she can do weights with her arms and leg lifts when in the bed. She tests her BG when she has strips once a day   MEDICATIONS: see list. Diabetes medication in Metformin   DIETARY INTAKE:  Usual eating pattern includes 3 meals and 0-2 snacks per day.  Everyday foods include frozen meals and simple breakfast foods.  Avoided foods include: none stated.    24-hr recall:  B ( AM): fresh fruit or yogurt  Snk ( AM): eggs, pancakes, coffee  L ( PM): frozen dinner Snk ( PM): fresh fruit or yogurt D ( PM): frozen dinner Snk ( PM): none Beverages: coffee, water, occasional fruit punch or tea with sweetener  Usual physical activity: arm chair exercises with weights  Estimated energy needs: 1400 calories 158 g carbohydrates 105 g protein 39 g fat  Progress Towards Goal(s):  In progress.   Nutritional Diagnosis:  NB-1.1 Food and nutrition-related knowledge deficit As related to diabetes.  As evidenced by no previous diabetes education.  Current A1c = 6.6% on 11/05/2011    Intervention:  Nutrition counseling and diabetes education provided. Discussed basic physiology of diabetes, SMBG and rationale of checking BG at alternate times of day, A1c, Carb Counting and reading food labels, and benefits of increased activity. Also provided information on ReliOn Meter at [facility] that has less expensive strips if she has trouble with getting them  through her insurance company.  Handouts given during visit include: Living Well with Diabetes Carb Counting and Food Label handouts Meal Plan Card ReliOn Meter and Strip handout  Monitoring/Evaluation:  Dietary intake, exercise, reading food labels, and body weight in 4 week(s). Patient to call and make appointment once she checks her daughter's schedule.

## 2011-12-15 ENCOUNTER — Other Ambulatory Visit: Payer: Self-pay | Admitting: Obstetrics

## 2011-12-15 DIAGNOSIS — N920 Excessive and frequent menstruation with regular cycle: Secondary | ICD-10-CM

## 2011-12-23 ENCOUNTER — Ambulatory Visit (HOSPITAL_COMMUNITY)
Admission: RE | Admit: 2011-12-23 | Discharge: 2011-12-23 | Disposition: A | Discharge status: Home / Self Care | Payer: PRIVATE HEALTH INSURANCE | Source: Ambulatory Visit | Attending: Obstetrics | Admitting: Obstetrics

## 2011-12-23 DIAGNOSIS — D259 Leiomyoma of uterus, unspecified: Secondary | ICD-10-CM | POA: Insufficient documentation

## 2011-12-23 DIAGNOSIS — N920 Excessive and frequent menstruation with regular cycle: Secondary | ICD-10-CM | POA: Insufficient documentation

## 2012-02-25 ENCOUNTER — Encounter: Payer: Self-pay | Admitting: Internal Medicine

## 2012-03-23 ENCOUNTER — Other Ambulatory Visit: Payer: Self-pay | Admitting: Family Medicine

## 2012-03-23 DIAGNOSIS — M25511 Pain in right shoulder: Secondary | ICD-10-CM

## 2012-04-01 ENCOUNTER — Other Ambulatory Visit: Payer: PRIVATE HEALTH INSURANCE

## 2012-04-22 ENCOUNTER — Ambulatory Visit
Admission: RE | Admit: 2012-04-22 | Discharge: 2012-04-22 | Disposition: A | Discharge status: Home / Self Care | Payer: Medicare PPO | Source: Ambulatory Visit | Attending: Family Medicine | Admitting: Family Medicine

## 2012-04-22 DIAGNOSIS — M25511 Pain in right shoulder: Secondary | ICD-10-CM

## 2012-09-05 ENCOUNTER — Encounter: Payer: Self-pay | Admitting: Internal Medicine

## 2013-06-10 ENCOUNTER — Emergency Department (HOSPITAL_COMMUNITY): Payer: Medicare HMO

## 2013-06-10 ENCOUNTER — Emergency Department (HOSPITAL_COMMUNITY)
Admission: EM | Admit: 2013-06-10 | Discharge: 2013-06-10 | Disposition: A | Discharge status: Home / Self Care | Payer: Medicare HMO | Attending: Emergency Medicine | Admitting: Emergency Medicine

## 2013-06-10 ENCOUNTER — Encounter (HOSPITAL_COMMUNITY): Payer: Self-pay | Admitting: *Deleted

## 2013-06-10 DIAGNOSIS — E785 Hyperlipidemia, unspecified: Secondary | ICD-10-CM | POA: Insufficient documentation

## 2013-06-10 DIAGNOSIS — Z862 Personal history of diseases of the blood and blood-forming organs and certain disorders involving the immune mechanism: Secondary | ICD-10-CM | POA: Insufficient documentation

## 2013-06-10 DIAGNOSIS — G40909 Epilepsy, unspecified, not intractable, without status epilepticus: Secondary | ICD-10-CM | POA: Insufficient documentation

## 2013-06-10 DIAGNOSIS — Z8739 Personal history of other diseases of the musculoskeletal system and connective tissue: Secondary | ICD-10-CM | POA: Insufficient documentation

## 2013-06-10 DIAGNOSIS — Z8719 Personal history of other diseases of the digestive system: Secondary | ICD-10-CM | POA: Insufficient documentation

## 2013-06-10 DIAGNOSIS — Z880 Allergy status to penicillin: Secondary | ICD-10-CM | POA: Insufficient documentation

## 2013-06-10 DIAGNOSIS — Z8673 Personal history of transient ischemic attack (TIA), and cerebral infarction without residual deficits: Secondary | ICD-10-CM | POA: Insufficient documentation

## 2013-06-10 DIAGNOSIS — I10 Essential (primary) hypertension: Secondary | ICD-10-CM | POA: Insufficient documentation

## 2013-06-10 DIAGNOSIS — Z7982 Long term (current) use of aspirin: Secondary | ICD-10-CM | POA: Insufficient documentation

## 2013-06-10 DIAGNOSIS — Z8742 Personal history of other diseases of the female genital tract: Secondary | ICD-10-CM | POA: Insufficient documentation

## 2013-06-10 DIAGNOSIS — Z79899 Other long term (current) drug therapy: Secondary | ICD-10-CM | POA: Insufficient documentation

## 2013-06-10 DIAGNOSIS — E119 Type 2 diabetes mellitus without complications: Secondary | ICD-10-CM | POA: Insufficient documentation

## 2013-06-10 DIAGNOSIS — R569 Unspecified convulsions: Secondary | ICD-10-CM

## 2013-06-10 DIAGNOSIS — Z8659 Personal history of other mental and behavioral disorders: Secondary | ICD-10-CM | POA: Insufficient documentation

## 2013-06-10 LAB — COMPREHENSIVE METABOLIC PANEL
ALBUMIN: 3.2 g/dL — AB (ref 3.5–5.2)
ALT: 16 U/L (ref 0–35)
AST: 20 U/L (ref 0–37)
Alkaline Phosphatase: 74 U/L (ref 39–117)
BILIRUBIN TOTAL: 0.3 mg/dL (ref 0.3–1.2)
BUN: 12 mg/dL (ref 6–23)
CHLORIDE: 101 meq/L (ref 96–112)
CO2: 24 mEq/L (ref 19–32)
CREATININE: 0.51 mg/dL (ref 0.50–1.10)
Calcium: 8.9 mg/dL (ref 8.4–10.5)
GFR calc Af Amer: >90 mL/min (ref >90)
GFR calc non Af Amer: >90 mL/min (ref >90)
Glucose, Bld: 98 mg/dL (ref 70–99)
Potassium: 3.3 mEq/L — ABNORMAL LOW (ref 3.7–5.3)
Sodium: 138 mEq/L (ref 137–147)
TOTAL PROTEIN: 7.5 g/dL (ref 6.0–8.3)

## 2013-06-10 LAB — CBC
HCT: 30.1 % — ABNORMAL LOW (ref 36.0–46.0)
Hemoglobin: 9.6 g/dL — ABNORMAL LOW (ref 12.0–15.0)
MCH: 24.4 pg — AB (ref 26.0–34.0)
MCHC: 31.9 g/dL (ref 30.0–36.0)
MCV: 76.4 fL — ABNORMAL LOW (ref 78.0–100.0)
Platelets: DECREASED 10*3/uL (ref 150–400)
RBC: 3.94 MIL/uL (ref 3.87–5.11)
RDW: 19.5 % — AB (ref 11.5–15.5)
WBC: 6 10*3/uL (ref 4.0–10.5)

## 2013-06-10 LAB — TROPONIN I

## 2013-06-10 LAB — POCT I-STAT, CHEM 8
BUN: 10 mg/dL (ref 6–23)
CHLORIDE: 105 meq/L (ref 96–112)
CREATININE: 0.6 mg/dL (ref 0.50–1.10)
Calcium, Ion: 1.16 mmol/L (ref 1.12–1.23)
Glucose, Bld: 99 mg/dL (ref 70–99)
HCT: 33 % — ABNORMAL LOW (ref 36.0–46.0)
Hemoglobin: 11.2 g/dL — ABNORMAL LOW (ref 12.0–15.0)
Potassium: 3.2 mEq/L — ABNORMAL LOW (ref 3.7–5.3)
Sodium: 140 mEq/L (ref 137–147)
TCO2: 25 mmol/L (ref 0–100)

## 2013-06-10 LAB — DIFFERENTIAL
BASOS ABS: 0 10*3/uL (ref 0.0–0.1)
BASOS PCT: 0 % (ref 0–1)
EOS ABS: 0.1 10*3/uL (ref 0.0–0.7)
Eosinophils Relative: 2 % (ref 0–5)
Lymphocytes Relative: 31 % (ref 12–46)
Lymphs Abs: 1.9 10*3/uL (ref 0.7–4.0)
Monocytes Absolute: 0.2 10*3/uL (ref 0.1–1.0)
Monocytes Relative: 3 % (ref 3–12)
NEUTROS PCT: 63 % (ref 43–77)
Neutro Abs: 3.8 10*3/uL (ref 1.7–7.7)

## 2013-06-10 LAB — APTT: APTT: 34 s (ref 24–37)

## 2013-06-10 LAB — PROTIME-INR
INR: 1.02 (ref 0.00–1.49)
PROTHROMBIN TIME: 13.2 s (ref 11.6–15.2)

## 2013-06-10 LAB — GLUCOSE, CAPILLARY: GLUCOSE-CAPILLARY: 89 mg/dL (ref 70–99)

## 2013-06-10 LAB — POCT I-STAT TROPONIN I: TROPONIN I, POC: 0 ng/mL (ref 0.00–0.08)

## 2013-06-10 MED ORDER — SODIUM CHLORIDE 0.9 % IV SOLN
1000.0000 mg | Freq: Once | INTRAVENOUS | Status: AC
  Administered 2013-06-10: 1000 mg via INTRAVENOUS
  Filled 2013-06-10: qty 10

## 2013-06-10 MED ORDER — LEVETIRACETAM 500 MG PO TABS: 500.0000 mg | ORAL_TABLET | Freq: Two times a day (BID) | ORAL | Status: DC

## 2013-06-10 NOTE — ED Notes (Signed)
Case Management at bedside.

## 2013-06-10 NOTE — ED Notes (Addendum)
Dr. Doy Mince cancelled code stroke due to seizure, RR RN did not complete NIH stroke scale.

## 2013-06-10 NOTE — ED Notes (Signed)
Code Stroke called by EMS. Pt. Daughter witness a sudden change in pt at 12:30. States pt had expressive aphasia and dysphagia. HX of prev stroke, no deficits, lack of coordination on right side, epilepsy.  Vitals BP 190/120           Hr 88            cbg 100  During the CT at 13:30 pt states that she had a seizure prior to this episode.

## 2013-06-10 NOTE — Discharge Instructions (Signed)
Follow up with your md in 1-2 weeks. °

## 2013-06-10 NOTE — Progress Notes (Signed)
Case Manager met patient /daughter for discharge assistance.Patient reports she resides with her daughter / grandson and that her daughter helps with her activities of daily living. Patient reports she used to have [facility] for a nurse aid and this was helpful.CHOICE list explained .Patient elects to use [facility].This CM - Provided information on [facility].PT and RN home health services will be ordered.Patient reports medication non compliance secondary to not getting to see Her PCP.This CM will provide a list of local Primary care providers.Patients daughter updated re plan of care.CM spoke with Altha [facility] intake re new patient referral This CM will fax home health orders to [facility].

## 2013-06-10 NOTE — ED Notes (Signed)
MD at bedside.Neurologist

## 2013-06-10 NOTE — Progress Notes (Signed)
Patient demographics verified.Home health orders faxed to [facility].This CM called [facility] and spoke with Fritz Pickerel who verifies patient orders have been received.

## 2013-06-10 NOTE — Consult Note (Signed)
Referring Physician: Roderic Palau    Chief Complaint: Seizure  HPI: [name] [name] is an 58 y.o. female with a history of stroke and seizures who reports that today she had the sensation that she was about to have a seizure.  She does have an aura prior to her seizures.  No one was around to warn at the time.  She then had a seizure.  Had resultant weakness afterward.  This sudden change was noted by the daughter and patient was also unable to speak.  EMS was called and the patient was brought in as a code stroke.  The patient reports that she has a seizure about every two months.  Her last seizure was about 2 weeks ago.  She reports that she has been out of medication for months.    At baseline the patient reports that she is nonambulatory due to her strokes in the past.  Date last known well: Date: 06/10/2013 Time last known well: Time: 12:30 tPA Given: No: Not felt to be a stroke  Past Medical History  Diagnosis Date  . Uterine fibroid   . Gastritis   . Hiatal hernia   . Hemorrhoids   . Anemia   . Stroke 12 1989  . Dyslipidemia   . Hypertension   . Seizure disorder   . Carpal tunnel syndrome   . Depression   . Osteoporosis   . Urinary incontinence   . Iron deficiency anemia 2008    negative EGD and colonoscopy  . Diabetes mellitus     Past Surgical History  Procedure Laterality Date  . Colonoscopy  2008  . Upper gastrointestinal endoscopy  2008  . Tracheostomy    . Breast surgery  1977  . Tubal ligation    . Cesarean section      x2    Family History  Problem Relation Age of Onset  . Heart disease Paternal Grandfather   . Prostate cancer Paternal Uncle   . Ovarian cancer Mother   . Diabetes Cousin   . Colon cancer Neg Hx    Social History:  reports that she has never smoked. She has never used smokeless tobacco. She reports that she does not drink alcohol or use illicit drugs.  Allergies:  Allergies  Allergen Reactions  . Amoxicillin   . Cephalexin   . Penicillins      Medications: I have reviewed the patient's current medications. Prior to Admission:   Current outpatient prescriptions:aspirin 325 MG tablet, Take 325 mg by mouth daily., Disp: , Rfl: ;  aspirin EC 81 MG tablet, Take 81 mg by mouth daily., Disp: , Rfl: ;  atenolol (TENORMIN) 25 MG tablet, Take 12.5 mg by mouth daily., Disp: , Rfl: ;  atorvastatin (LIPITOR) 40 MG tablet, Take 40 mg by mouth daily., Disp: , Rfl:  levETIRAcetam (KEPPRA) 500 MG tablet, Take 1 tablet (500 mg total) by mouth every 12 (twelve) hours., Disp: 30 tablet, Rfl: 0;  lisinopril (PRINIVIL,ZESTRIL) 10 MG tablet, Take 10 mg by mouth daily., Disp: , Rfl: ;  metFORMIN (GLUCOPHAGE) 500 MG tablet, Take 500 mg by mouth daily., Disp: , Rfl: ;  omeprazole (PRILOSEC) 40 MG capsule, Take 40 mg by mouth daily., Disp: , Rfl:  triamterene-hydrochlorothiazide (MAXZIDE-25) 37.5-25 MG per tablet, Take 1.5 tablets by mouth daily., Disp: , Rfl:   ROS: History obtained from the patient  General ROS: negative for - chills, fatigue, fever, night sweats, weight gain or weight loss Psychological ROS: negative for - behavioral disorder, hallucinations, memory  difficulties, mood swings or suicidal ideation Ophthalmic ROS: negative for - blurry vision, double vision, eye pain or loss of vision ENT ROS: negative for - epistaxis, nasal discharge, oral lesions, sore throat, tinnitus or vertigo Allergy and Immunology ROS: negative for - hives or itchy/watery eyes Hematological and Lymphatic ROS: negative for - bleeding problems, bruising or swollen lymph nodes Endocrine ROS: negative for - galactorrhea, hair pattern changes, polydipsia/polyuria or temperature intolerance Respiratory ROS: negative for - cough, hemoptysis, shortness of breath or wheezing Cardiovascular ROS: negative for - chest pain, dyspnea on exertion, edema or irregular heartbeat Gastrointestinal ROS: negative for - abdominal pain, diarrhea, hematemesis, nausea/vomiting or stool  incontinence Genito-Urinary ROS: negative for - dysuria, hematuria, incontinence or urinary frequency/urgency Musculoskeletal ROS: negative for - joint swelling or muscular weakness Neurological ROS: as noted in HPI Dermatological ROS: negative for rash and skin lesion changes  Physical Examination: Last menstrual period 06/10/2013.  Neurologic Examination: Mental Status: Alert, oriented, thought content appropriate.  Speech fluent without evidence of aphasia.  Able to follow 3 step commands without difficulty. Cranial Nerves: II: Discs flat bilaterally; Visual fields grossly normal, pupils equal, round, reactive to light and accommodation III,IV, VI: ptosis not present, extra-ocular motions intact bilaterally V,VII: smile symmetric, facial light touch sensation normal bilaterally VIII: hearing normal bilaterally IX,X: gag reflex present XI: bilateral shoulder shrug XII: midline tongue extension Motor: 5/5 in the bilateral upper extremity.  Able to lift the left leg off the bed.  Only able to lift the right leg about an inch off the bed.  Increased tone in both lower extremities.    Sensory: Pinprick and light touch intact throughout, bilaterally Deep Tendon Reflexes: 1+ in the upper extremities and absent in the lower extremities Plantars: Right: upgoing   Left: upgoing Cerebellar: normal finger-to-nose.  Unable to perform heel to shin. Gait: Unable to ambulate CV: pulses palpable throughout     Laboratory Studies:  Basic Metabolic Panel:  Recent Labs Lab 06/10/13 1335  NA 140  K 3.2*  CL 105  GLUCOSE 99  BUN 10  CREATININE 0.60    Liver Function Tests: No results found for this basename: AST, ALT, ALKPHOS, BILITOT, PROT, ALBUMIN,  in the last 168 hours No results found for this basename: LIPASE, AMYLASE,  in the last 168 hours No results found for this basename: AMMONIA,  in the last 168 hours  CBC:  Recent Labs Lab 06/10/13 1329 06/10/13 1335  WBC PENDING   --   NEUTROABS 3.8  --   HGB 9.6* 11.2*  HCT 30.1* 33.0*  MCV 76.4*  --   PLT PENDING  --     Cardiac Enzymes: No results found for this basename: CKTOTAL, CKMB, CKMBINDEX, TROPONINI,  in the last 168 hours  BNP: No components found with this basename: POCBNP,   CBG:  Recent Labs Lab 06/10/13 1340  GLUCAP 89    Microbiology: Results for orders placed during the hospital encounter of 03/23/10  CULTURE, BLOOD (ROUTINE X 2)     Status: None   Collection Time    03/23/10 11:15 AM      Result Value Ref Range Status   Specimen Description BLOOD RIGHT ARM   Final   Special Requests     Final   Value: BOTTLES DRAWN AEROBIC AND ANAEROBIC 3CC IN BOTH BOTTLES   Culture  Setup Time [id number]   Final   Culture NO GROWTH 5 DAYS   Final   Report Status 03/29/2010 FINAL   Final  CULTURE, BLOOD (ROUTINE X 2)     Status: None   Collection Time    03/23/10 11:30 AM      Result Value Ref Range Status   Specimen Description BLOOD LEFT ARM   Final   Special Requests     Final   Value: BOTTLES DRAWN AEROBIC AND ANAEROBIC 5CC IN BOTH BOTTLES   Culture  Setup Time [id number]   Final   Culture NO GROWTH 5 DAYS   Final   Report Status 03/29/2010 FINAL   Final  MRSA PCR SCREENING     Status: None   Collection Time    03/23/10  3:09 PM      Result Value Ref Range Status   MRSA by PCR    NEGATIVE Final   Value: NEGATIVE            The GeneXpert MRSA Assay (FDA     approved for NASAL specimens     only), is one component of a     comprehensive MRSA colonization     surveillance program. It is not     intended to diagnose MRSA     infection nor to guide or     monitor treatment for     MRSA infections.    Coagulation Studies: No results found for this basename: LABPROT, INR,  in the last 72 hours  Urinalysis: No results found for this basename: COLORURINE, APPERANCEUR, LABSPEC, PHURINE, GLUCOSEU, HGBUR, BILIRUBINUR, KETONESUR, PROTEINUR, UROBILINOGEN, NITRITE, LEUKOCYTESUR,  in  the last 168 hours  Lipid Panel:    Component Value Date/Time   CHOL  Value: 115        ATP III CLASSIFICATION:  <200     mg/dL   Desirable  200-239  mg/dL   Borderline High  >=240    mg/dL   High        03/24/2010 0519   TRIG 65 03/24/2010 0519   HDL 43 03/24/2010 0519   CHOLHDL 2.7 03/24/2010 0519   VLDL 13 03/24/2010 0519   LDLCALC  Value: 59        Total Cholesterol/HDL:CHD Risk Coronary Heart Disease Risk Table                     Men   Women  1/2 Average Risk   3.4   3.3  Average Risk       5.0   4.4  2 X Average Risk   9.6   7.1  3 X Average Risk  23.4   11.0        Use the calculated Patient Ratio above and the CHD Risk Table to determine the patient's CHD Risk.        ATP III CLASSIFICATION (LDL):  <100     mg/dL   Optimal  100-129  mg/dL   Near or Above                    Optimal  130-159  mg/dL   Borderline  160-189  mg/dL   High  >190     mg/dL   Very High 03/24/2010 0519    HgbA1C:  No results found for this basename: HGBA1C    Urine Drug Screen:   No results found for this basename: labopia,  cocainscrnur,  labbenz,  amphetmu,  thcu,  labbarb    Alcohol Level: No results found for this basename: ETH,  in the last 168 hours   Imaging: Ct Head (brain) Wo Contrast  06/10/2013   CLINICAL DATA:  Code stroke.  Right-sided weakness.  Seizure.  EXAM: CT HEAD WITHOUT CONTRAST  TECHNIQUE: Contiguous axial images were obtained from the base of the skull through the vertex without contrast.  COMPARISON:  12/18/2008.  FINDINGS: Marked bifrontal encephalomalacia likely related to old trauma or possible vasospasm with bilateral anterior cerebral artery infarcts. No acute stroke or hemorrhage. No mass lesion or extra-axial fluid. 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 Bilateral cerebellar atrophy. Calvarium intact. No sinus or mastoid disease. Similar appearance to priors.  IMPRESSION: Chronic changes as described.  No acute intracranial findings.  Critical Value/emergent  results were called by telephone at the time of interpretation on 06/10/2013 at 1:48 PM to Dr. Roderic Palau , who verbally acknowledged these results.   Electronically Signed   By: Rolla Flatten M.D.   On: 06/10/2013 13:49    Assessment: 58 y.o. female with a history of stroke and seizures.  Reports having a seizure today and is quickly returning to baseline.  Reports noncompliance.  Head CT reviewed and shows no acute changes.    Stroke Risk Factors - diabetes mellitus, hyperlipidemia and hypertension  Plan: 1. Continue ASA 2.  Keppra 1000mg  IV now 3.  To continue maintenance of 500mg  BID.  Patient at baseline.  This may be given po to continue on an outpatient basis.    Case discussed with Dr. Pennelope Bracken, MD [facility] [phone] 06/10/2013, 1:58 PM

## 2013-06-10 NOTE — ED Notes (Addendum)
Pt. States she has not taken meds (Keppra) for months.

## 2013-06-10 NOTE — ED Notes (Signed)
MD at bedside. 
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
## 2013-06-11 NOTE — ED Provider Notes (Signed)
CSN: [id number]     Arrival date & time 06/10/13  1322 History   First MD Initiated Contact with Patient 06/10/13 1326     Chief Complaint  Patient presents with  . Code Stroke  . Seizures     (Consider location/radiation/quality/duration/timing/severity/associated sxs/prior Treatment) Patient is a 58 y.o. female presenting with seizures. The history is provided by a relative (the pt had a sz and then had slurred speech.  she has not been taking her medicine.  pt is back to normal now).  Seizures Seizure activity on arrival: yes   Seizure type:  Myoclonic Preceding symptoms: no sensation of an aura present   Initial focality:  None Episode characteristics: no abnormal movements   Postictal symptoms: no confusion   Return to baseline: yes   Severity:  Moderate   Past Medical History  Diagnosis Date  . Uterine fibroid   . Gastritis   . Hiatal hernia   . Hemorrhoids   . Anemia   . Stroke 12 1989  . Dyslipidemia   . Hypertension   . Seizure disorder   . Carpal tunnel syndrome   . Depression   . Osteoporosis   . Urinary incontinence   . Iron deficiency anemia 2008    negative EGD and colonoscopy  . Diabetes mellitus    Past Surgical History  Procedure Laterality Date  . Colonoscopy  2008  . Upper gastrointestinal endoscopy  2008  . Tracheostomy    . Breast surgery  1977  . Tubal ligation    . Cesarean section      x2   Family History  Problem Relation Age of Onset  . Heart disease Paternal Grandfather   . Prostate cancer Paternal Uncle   . Ovarian cancer Mother   . Diabetes Cousin   . Colon cancer Neg Hx    History  Substance Use Topics  . Smoking status: Never Smoker   . Smokeless tobacco: Never Used  . Alcohol Use: No   OB History   Grav Para Term Preterm Abortions TAB SAB Ect Mult Living                 Review of Systems  Constitutional: Negative for appetite change and fatigue.  HENT: Negative for congestion, ear discharge and sinus pressure.    Eyes: Negative for discharge.  Respiratory: Negative for cough.   Cardiovascular: Negative for chest pain.  Gastrointestinal: Negative for abdominal pain and diarrhea.  Genitourinary: Negative for frequency and hematuria.  Musculoskeletal: Negative for back pain.  Skin: Negative for rash.  Neurological: Positive for seizures. Negative for headaches.  Psychiatric/Behavioral: Negative for hallucinations.      Allergies  Amoxicillin; Cephalexin; and Penicillins  Home Medications   Current Outpatient Rx  Name  Route  Sig  Dispense  Refill  . aspirin 325 MG tablet   Oral   Take 325 mg by mouth daily.         Marland Kitchen atenolol (TENORMIN) 25 MG tablet   Oral   Take 12.5 mg by mouth daily.         Marland Kitchen atorvastatin (LIPITOR) 40 MG tablet   Oral   Take 40 mg by mouth daily.         Marland Kitchen lisinopril (PRINIVIL,ZESTRIL) 10 MG tablet   Oral   Take 10 mg by mouth daily.         . metFORMIN (GLUCOPHAGE) 500 MG tablet   Oral   Take 500 mg by mouth daily.         Marland Kitchen  EXPIRED: levETIRAcetam (KEPPRA) 500 MG tablet   Oral   Take 1 tablet (500 mg total) by mouth every 12 (twelve) hours.   30 tablet   0   . levETIRAcetam (KEPPRA) 500 MG tablet   Oral   Take 1 tablet (500 mg total) by mouth 2 (two) times daily.   60 tablet   0    BP 141/71  Pulse 77  Temp(Src) 97.9 F (36.6 C) (Oral)  Resp 14  SpO2 100%  LMP 06/10/2013 Physical Exam  Constitutional: She is oriented to person, place, and time. She appears well-developed.  HENT:  Head: Normocephalic.  Eyes: Conjunctivae and EOM are normal. No scleral icterus.  Neck: Neck supple. No thyromegaly present.  Cardiovascular: Normal rate and regular rhythm.  Exam reveals no gallop and no friction rub.   No murmur heard. Pulmonary/Chest: No stridor. She has no wheezes. She has no rales. She exhibits no tenderness.  Abdominal: She exhibits no distension. There is no tenderness. There is no rebound.  Musculoskeletal: Normal range of  motion. She exhibits no edema.  Lymphadenopathy:    She has no cervical adenopathy.  Neurological: She is oriented to person, place, and time. She exhibits normal muscle tone. Coordination normal.  Skin: No rash noted. No erythema.  Psychiatric: She has a normal mood and affect. Her behavior is normal.    ED Course  Procedures (including critical care time) Labs Review Labs Reviewed  CBC - Abnormal; Notable for the following:    Hemoglobin 9.6 (*)    HCT 30.1 (*)    MCV 76.4 (*)    MCH 24.4 (*)    RDW 19.5 (*)    All other components within normal limits  COMPREHENSIVE METABOLIC PANEL - Abnormal; Notable for the following:    Potassium 3.3 (*)    Albumin 3.2 (*)    All other components within normal limits  POCT I-STAT, CHEM 8 - Abnormal; Notable for the following:    Potassium 3.2 (*)    Hemoglobin 11.2 (*)    HCT 33.0 (*)    All other components within normal limits  PROTIME-INR  APTT  DIFFERENTIAL  TROPONIN I  GLUCOSE, CAPILLARY  POCT I-STAT TROPONIN I   Imaging Review Ct Head (brain) Wo Contrast  06/10/2013   CLINICAL DATA:  Code stroke.  Right-sided weakness.  Seizure.  EXAM: CT HEAD WITHOUT CONTRAST  TECHNIQUE: Contiguous axial images were obtained from the base of the skull through the vertex without contrast.  COMPARISON:  12/18/2008.  FINDINGS: Marked bifrontal encephalomalacia likely related to old trauma or possible vasospasm with bilateral anterior cerebral artery infarcts. No acute stroke or hemorrhage. No mass lesion or extra-axial fluid. 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 Bilateral cerebellar atrophy. Calvarium intact. No sinus or mastoid disease. Similar appearance to priors.  IMPRESSION: Chronic changes as described.  No acute intracranial findings.  Critical Value/emergent results were called by telephone at the time of interpretation on 06/10/2013 at 1:48 PM to Dr. Roderic Palau , who verbally acknowledged these results.   Electronically Signed    By: Rolla Flatten M.D.   On: 06/10/2013 13:49    EKG Interpretation   None     pt seen by neuro and it was decided the pt had a sz and the pt should be put back on keppra  MDM   Final diagnoses:  Seizure        Maudry Diego, MD 06/11/13 [phone]

## 2013-07-24 ENCOUNTER — Encounter (HOSPITAL_COMMUNITY): Payer: Self-pay | Admitting: Emergency Medicine

## 2013-07-24 ENCOUNTER — Observation Stay (HOSPITAL_COMMUNITY)
Admission: EM | Admit: 2013-07-24 | Discharge: 2013-07-25 | Disposition: A | Discharge status: Home / Self Care | Payer: Medicare HMO | Attending: Internal Medicine | Admitting: Internal Medicine

## 2013-07-24 DIAGNOSIS — Z8742 Personal history of other diseases of the female genital tract: Secondary | ICD-10-CM | POA: Insufficient documentation

## 2013-07-24 DIAGNOSIS — K644 Residual hemorrhoidal skin tags: Secondary | ICD-10-CM

## 2013-07-24 DIAGNOSIS — D649 Anemia, unspecified: Secondary | ICD-10-CM | POA: Diagnosis present

## 2013-07-24 DIAGNOSIS — D509 Iron deficiency anemia, unspecified: Principal | ICD-10-CM | POA: Insufficient documentation

## 2013-07-24 DIAGNOSIS — E119 Type 2 diabetes mellitus without complications: Secondary | ICD-10-CM | POA: Diagnosis present

## 2013-07-24 DIAGNOSIS — Z8669 Personal history of other diseases of the nervous system and sense organs: Secondary | ICD-10-CM | POA: Insufficient documentation

## 2013-07-24 DIAGNOSIS — E785 Hyperlipidemia, unspecified: Secondary | ICD-10-CM | POA: Insufficient documentation

## 2013-07-24 DIAGNOSIS — I6350 Cerebral infarction due to unspecified occlusion or stenosis of unspecified cerebral artery: Secondary | ICD-10-CM

## 2013-07-24 DIAGNOSIS — K2970 Gastritis, unspecified, without bleeding: Secondary | ICD-10-CM

## 2013-07-24 DIAGNOSIS — R5381 Other malaise: Secondary | ICD-10-CM | POA: Insufficient documentation

## 2013-07-24 DIAGNOSIS — R5383 Other fatigue: Secondary | ICD-10-CM

## 2013-07-24 DIAGNOSIS — I10 Essential (primary) hypertension: Secondary | ICD-10-CM | POA: Diagnosis present

## 2013-07-24 DIAGNOSIS — Z79899 Other long term (current) drug therapy: Secondary | ICD-10-CM | POA: Insufficient documentation

## 2013-07-24 DIAGNOSIS — M810 Age-related osteoporosis without current pathological fracture: Secondary | ICD-10-CM | POA: Insufficient documentation

## 2013-07-24 DIAGNOSIS — E876 Hypokalemia: Secondary | ICD-10-CM | POA: Insufficient documentation

## 2013-07-24 DIAGNOSIS — Z1231 Encounter for screening mammogram for malignant neoplasm of breast: Secondary | ICD-10-CM

## 2013-07-24 DIAGNOSIS — Z7982 Long term (current) use of aspirin: Secondary | ICD-10-CM | POA: Insufficient documentation

## 2013-07-24 DIAGNOSIS — K2990 Gastroduodenitis, unspecified, without bleeding: Secondary | ICD-10-CM

## 2013-07-24 DIAGNOSIS — K449 Diaphragmatic hernia without obstruction or gangrene: Secondary | ICD-10-CM

## 2013-07-24 DIAGNOSIS — Z880 Allergy status to penicillin: Secondary | ICD-10-CM | POA: Insufficient documentation

## 2013-07-24 DIAGNOSIS — Z9889 Other specified postprocedural states: Secondary | ICD-10-CM | POA: Insufficient documentation

## 2013-07-24 DIAGNOSIS — R569 Unspecified convulsions: Secondary | ICD-10-CM | POA: Diagnosis present

## 2013-07-24 DIAGNOSIS — F3289 Other specified depressive episodes: Secondary | ICD-10-CM | POA: Insufficient documentation

## 2013-07-24 DIAGNOSIS — F329 Major depressive disorder, single episode, unspecified: Secondary | ICD-10-CM | POA: Insufficient documentation

## 2013-07-24 DIAGNOSIS — D259 Leiomyoma of uterus, unspecified: Secondary | ICD-10-CM | POA: Diagnosis present

## 2013-07-24 DIAGNOSIS — Z881 Allergy status to other antibiotic agents status: Secondary | ICD-10-CM | POA: Insufficient documentation

## 2013-07-24 DIAGNOSIS — G40909 Epilepsy, unspecified, not intractable, without status epilepticus: Secondary | ICD-10-CM | POA: Insufficient documentation

## 2013-07-24 DIAGNOSIS — Z8673 Personal history of transient ischemic attack (TIA), and cerebral infarction without residual deficits: Secondary | ICD-10-CM | POA: Diagnosis present

## 2013-07-24 LAB — CBC WITH DIFFERENTIAL/PLATELET
Basophils Absolute: 0 10*3/uL (ref 0.0–0.1)
Basophils Relative: 0 % (ref 0–1)
EOS ABS: 0.2 10*3/uL (ref 0.0–0.7)
Eosinophils Relative: 3 % (ref 0–5)
HCT: 20.8 % — ABNORMAL LOW (ref 36.0–46.0)
HEMOGLOBIN: 6.7 g/dL — AB (ref 12.0–15.0)
LYMPHS ABS: 1.7 10*3/uL (ref 0.7–4.0)
Lymphocytes Relative: 24 % (ref 12–46)
MCH: 25.4 pg — AB (ref 26.0–34.0)
MCHC: 32.2 g/dL (ref 30.0–36.0)
MCV: 78.8 fL (ref 78.0–100.0)
Monocytes Absolute: 0.4 10*3/uL (ref 0.1–1.0)
Monocytes Relative: 5 % (ref 3–12)
NEUTROS PCT: 68 % (ref 43–77)
Neutro Abs: 4.9 10*3/uL (ref 1.7–7.7)
Platelets: 138 10*3/uL — ABNORMAL LOW (ref 150–400)
RBC: 2.64 MIL/uL — AB (ref 3.87–5.11)
RDW: 19.8 % — ABNORMAL HIGH (ref 11.5–15.5)
WBC: 7.1 10*3/uL (ref 4.0–10.5)

## 2013-07-24 LAB — COMPREHENSIVE METABOLIC PANEL
ALT: 18 U/L (ref 0–35)
AST: 24 U/L (ref 0–37)
Albumin: 3 g/dL — ABNORMAL LOW (ref 3.5–5.2)
Alkaline Phosphatase: 59 U/L (ref 39–117)
BUN: 28 mg/dL — ABNORMAL HIGH (ref 6–23)
CO2: 26 mEq/L (ref 19–32)
Calcium: 9.1 mg/dL (ref 8.4–10.5)
Chloride: 105 mEq/L (ref 96–112)
Creatinine, Ser: 0.61 mg/dL (ref 0.50–1.10)
GFR calc non Af Amer: >90 mL/min (ref >90)
GLUCOSE: 98 mg/dL (ref 70–99)
POTASSIUM: 2.8 meq/L — AB (ref 3.7–5.3)
Sodium: 144 mEq/L (ref 137–147)
TOTAL PROTEIN: 6.7 g/dL (ref 6.0–8.3)
Total Bilirubin: <0.2 mg/dL — ABNORMAL LOW (ref 0.3–1.2)

## 2013-07-24 LAB — POC OCCULT BLOOD, ED: Fecal Occult Bld: NEGATIVE

## 2013-07-24 LAB — PREPARE RBC (CROSSMATCH)

## 2013-07-24 LAB — ABO/RH: ABO/RH(D): A NEG

## 2013-07-24 LAB — I-STAT TROPONIN, ED: TROPONIN I, POC: 0 ng/mL (ref 0.00–0.08)

## 2013-07-24 MED ORDER — METFORMIN HCL 500 MG PO TABS: 500.0000 mg | ORAL_TABLET | Freq: Every day | ORAL | Status: DC

## 2013-07-24 MED ORDER — ATORVASTATIN CALCIUM 40 MG PO TABS
40.0000 mg | ORAL_TABLET | Freq: Every day | ORAL | Status: DC
  Administered 2013-07-25: 40 mg via ORAL
  Filled 2013-07-24: qty 1

## 2013-07-24 MED ORDER — LEVETIRACETAM 500 MG PO TABS
500.0000 mg | ORAL_TABLET | Freq: Two times a day (BID) | ORAL | Status: DC
  Administered 2013-07-25 (×2): 500 mg via ORAL
  Filled 2013-07-24 (×3): qty 1

## 2013-07-24 MED ORDER — METFORMIN HCL 500 MG PO TABS
500.0000 mg | ORAL_TABLET | Freq: Every day | ORAL | Status: DC
  Filled 2013-07-24: qty 1

## 2013-07-24 MED ORDER — ASPIRIN 325 MG PO TABS
325.0000 mg | ORAL_TABLET | Freq: Every day | ORAL | Status: DC
  Administered 2013-07-25: 325 mg via ORAL
  Filled 2013-07-24: qty 1

## 2013-07-24 MED ORDER — HEPARIN SODIUM (PORCINE) 5000 UNIT/ML IJ SOLN
5000.0000 [IU] | Freq: Three times a day (TID) | INTRAMUSCULAR | Status: DC
  Filled 2013-07-24 (×6): qty 1

## 2013-07-24 MED ORDER — POTASSIUM CHLORIDE 10 MEQ/100ML IV SOLN
10.0000 meq | Freq: Once | INTRAVENOUS | Status: AC
  Administered 2013-07-24: 10 meq via INTRAVENOUS
  Filled 2013-07-24: qty 100

## 2013-07-24 MED ORDER — ATENOLOL 12.5 MG HALF TABLET
12.5000 mg | ORAL_TABLET | Freq: Every day | ORAL | Status: DC
  Administered 2013-07-25: 12.5 mg via ORAL
  Filled 2013-07-24: qty 1

## 2013-07-24 MED ORDER — POTASSIUM CHLORIDE CRYS ER 20 MEQ PO TBCR
40.0000 meq | EXTENDED_RELEASE_TABLET | Freq: Once | ORAL | Status: AC
  Administered 2013-07-24: 40 meq via ORAL
  Filled 2013-07-24: qty 2

## 2013-07-24 MED ORDER — LISINOPRIL 5 MG PO TABS
5.0000 mg | ORAL_TABLET | Freq: Every day | ORAL | Status: DC
  Administered 2013-07-25: 5 mg via ORAL
  Filled 2013-07-24: qty 1

## 2013-07-24 NOTE — H&P (Signed)
[facility] History and Physical  [name] [name] FUX:[id number] DOB: 04/09/1956 DOA: 07/24/2013  Referring physician: EDP PCP: Estill Dooms, MD   Chief Complaint: Fatigue   HPI: [name] [name] is a 58 y.o. female with PMH of iron deficiency anemia, who is sent in by her PCP to the ED for low HGB.  Patient has been feeling very fatigued over the recent weeks and so went to her PCP for evaluation this morning.  Labs were drawn which demonstrated HGB of 6.1 (6.7 in ED today) and so patient was sent in to the ED.  She denies h/o GI bleed, no melena, no BRBPR, no N/V, only complaint is fatigue.  No SOB, no chest pain.  Work up in 2008 for Iron deficiency anemia includes negative colonoscopy and negative EGD.  Stool guiac negative today in ED.  Review of Systems: Systems reviewed.  As above, otherwise negative  Past Medical History  Diagnosis Date  . Uterine fibroid   . Gastritis   . Hiatal hernia   . Hemorrhoids   . Anemia   . Stroke 12 1989  . Dyslipidemia   . Hypertension   . Seizure disorder   . Carpal tunnel syndrome   . Depression   . Osteoporosis   . Urinary incontinence   . Iron deficiency anemia 2008    negative EGD and colonoscopy  . Diabetes mellitus    Past Surgical History  Procedure Laterality Date  . Colonoscopy  2008  . Upper gastrointestinal endoscopy  2008  . Tracheostomy    . Breast surgery  1977  . Tubal ligation    . Cesarean section      x2   Social History:  reports that she has never smoked. She has never used smokeless tobacco. She reports that she does not drink alcohol or use illicit drugs.  Allergies  Allergen Reactions  . Amoxicillin Itching  . Cephalexin Itching  . Penicillins Itching    Family History  Problem Relation Age of Onset  . Heart disease Paternal Grandfather   . Prostate cancer Paternal Uncle   . Ovarian cancer Mother   . Diabetes Cousin   . Colon cancer Neg Hx      Prior to Admission medications   Medication Sig  Start Date End Date Taking? Authorizing Provider  aspirin 325 MG tablet Take 325 mg by mouth daily.   Yes Historical Provider, MD  atenolol (TENORMIN) 25 MG tablet Take 12.5 mg by mouth daily.   Yes Historical Provider, MD  atorvastatin (LIPITOR) 40 MG tablet Take 40 mg by mouth daily at 12 noon.    Yes Historical Provider, MD  levETIRAcetam (KEPPRA) 500 MG tablet Take 1 tablet (500 mg total) by mouth 2 (two) times daily. 06/10/13  Yes Maudry Diego, MD  lisinopril (PRINIVIL,ZESTRIL) 10 MG tablet Take 5 mg by mouth daily.    Yes Historical Provider, MD  metFORMIN (GLUCOPHAGE) 500 MG tablet Take 500 mg by mouth daily.   Yes Historical Provider, MD   Physical Exam: Filed Vitals:   07/24/13 2015  BP: 118/62  Pulse: 66  Temp:   Resp: 15    BP 118/62  Pulse 66  Temp(Src) 97.8 F (36.6 C) (Oral)  Resp 15  SpO2 100%  LMP 06/24/2013  General Appearance:    Alert, oriented, no distress, appears stated age  Head:    Normocephalic, atraumatic  Eyes:    PERRL, EOMI, sclera non-icteric        Nose:   Nares  without drainage or epistaxis. Mucosa, turbinates normal  Throat:   Moist mucous membranes. Oropharynx without erythema or exudate.  Neck:   Supple. No carotid bruits.  No thyromegaly.  No lymphadenopathy.   Back:     No CVA tenderness, no spinal tenderness  Lungs:     Clear to auscultation bilaterally, without wheezes, rhonchi or rales  Chest wall:    No tenderness to palpitation  Heart:    Regular rate and rhythm without murmurs, gallops, rubs  Abdomen:     Soft, non-tender, nondistended, normal bowel sounds, no organomegaly  Genitalia:    deferred  Rectal:    deferred  Extremities:   No clubbing, cyanosis or edema.  Pulses:   2+ and symmetric all extremities  Skin:   Skin color, texture, turgor normal, no rashes or lesions  Lymph nodes:   Cervical, supraclavicular, and axillary nodes normal  Neurologic:   CNII-XII intact. Normal strength, sensation and reflexes      throughout     Labs on Admission:  Basic Metabolic Panel:  Recent Labs Lab 07/24/13 1935  NA 144  K 2.8*  CL 105  CO2 26  GLUCOSE 98  BUN 28*  CREATININE 0.61  CALCIUM 9.1   Liver Function Tests:  Recent Labs Lab 07/24/13 1935  AST 24  ALT 18  ALKPHOS 59  BILITOT <0.2*  PROT 6.7  ALBUMIN 3.0*   No results found for this basename: LIPASE, AMYLASE,  in the last 168 hours No results found for this basename: AMMONIA,  in the last 168 hours CBC:  Recent Labs Lab 07/24/13 1935  WBC 7.1  NEUTROABS 4.9  HGB 6.7*  HCT 20.8*  MCV 78.8  PLT 138*   Cardiac Enzymes: No results found for this basename: CKTOTAL, CKMB, CKMBINDEX, TROPONINI,  in the last 168 hours  BNP (last 3 results) No results found for this basename: PROBNP,  in the last 8760 hours CBG: No results found for this basename: GLUCAP,  in the last 168 hours  Radiological Exams on Admission: No results found.  EKG: Independently reviewed.  Assessment/Plan Principal Problem:   Symptomatic anemia Active Problems:   Low hemoglobin   1. Symptomatic anemia - repeat Iron studies pending, transfusion ordered for 2 units PRBC.  Repeat CBC in AM.  No obvious source of blood loss, stool guiac negative today in ED.  Has not been on iron supplementation recently, work up in 2008 for Fe deficiency anemia included a negative EGD and negative colonoscopy.  Code Status: Full Code  Family Communication: No family in room Disposition Plan: Admit to obs   Time spent: 50 min  GARDNER, JARED M. [facility] Pager [phone]  If 7AM-7PM, please contact the day team taking care of the patient [email or web address] Password [email or web address] 07/24/2013, 9:07 PM

## 2013-07-24 NOTE — ED Notes (Signed)
Per EMS pt had labs drawn at home and resulted back with Hemoglobin of 6.1. Pt recommended to ED by PCP. Pt's only complaint is feeling tired; denies pain. A&Ox4.

## 2013-07-24 NOTE — ED Notes (Signed)
Attempt to call report, number left with Network engineer.

## 2013-07-24 NOTE — ED Provider Notes (Signed)
CSN: [id number]     Arrival date & time 07/24/13  1925 History   First MD Initiated Contact with Patient 07/24/13 1927     Chief Complaint  Patient presents with  . Abnormal Lab     (Consider location/radiation/quality/duration/timing/severity/associated sxs/prior Treatment) The history is provided by the patient and medical records.   This is a 58 year old female with past medical history significant for prior bilateral cerebral strokes, iron deficiency anemia, hypertension, dyslipidemia, seizure disorder, presenting to the ED for abnormal labs.  Patient states she was seen by her primary care physician earlier this morning, had labs drawn and was notified this afternoon that her hemoglobin was low and was encouraged him to the ED for further evaluation. Patient denies any recent melanotic stools. No prior history of GI bleed. She is currently on daily aspirin, and other anticoagulants. Patient has had recent colonoscopy in 2013, which was negative.  Her only complaint is that she's been feeling increasingly fatigued for the past several days. She denies any chest pain, shortness of breath, palpitations, dizziness, lightheadedness, or syncopal episodes.  Vital signs stable on arrival.  Past Medical History  Diagnosis Date  . Uterine fibroid   . Gastritis   . Hiatal hernia   . Hemorrhoids   . Anemia   . Stroke 12 1989  . Dyslipidemia   . Hypertension   . Seizure disorder   . Carpal tunnel syndrome   . Depression   . Osteoporosis   . Urinary incontinence   . Iron deficiency anemia 2008    negative EGD and colonoscopy  . Diabetes mellitus    Past Surgical History  Procedure Laterality Date  . Colonoscopy  2008  . Upper gastrointestinal endoscopy  2008  . Tracheostomy    . Breast surgery  1977  . Tubal ligation    . Cesarean section      x2   Family History  Problem Relation Age of Onset  . Heart disease Paternal Grandfather   . Prostate cancer Paternal Uncle   . Ovarian  cancer Mother   . Diabetes Cousin   . Colon cancer Neg Hx    History  Substance Use Topics  . Smoking status: Never Smoker   . Smokeless tobacco: Never Used  . Alcohol Use: No   OB History   Grav Para Term Preterm Abortions TAB SAB Ect Mult Living                 Review of Systems  Constitutional: Positive for fatigue.       Abnormal labs  All other systems reviewed and are negative.      Allergies  Amoxicillin; Cephalexin; and Penicillins  Home Medications   Current Outpatient Rx  Name  Route  Sig  Dispense  Refill  . aspirin 325 MG tablet   Oral   Take 325 mg by mouth daily.         Marland Kitchen atenolol (TENORMIN) 25 MG tablet   Oral   Take 12.5 mg by mouth daily.         Marland Kitchen atorvastatin (LIPITOR) 40 MG tablet   Oral   Take 40 mg by mouth daily.         Marland Kitchen EXPIRED: levETIRAcetam (KEPPRA) 500 MG tablet   Oral   Take 1 tablet (500 mg total) by mouth every 12 (twelve) hours.   30 tablet   0   . levETIRAcetam (KEPPRA) 500 MG tablet   Oral   Take 1 tablet (500 mg  total) by mouth 2 (two) times daily.   60 tablet   0   . lisinopril (PRINIVIL,ZESTRIL) 10 MG tablet   Oral   Take 10 mg by mouth daily.         . metFORMIN (GLUCOPHAGE) 500 MG tablet   Oral   Take 500 mg by mouth daily.          BP 123/56  Pulse 59  Temp(Src) 97.8 F (36.6 C) (Oral)  Resp 18  SpO2 100%  LMP 06/24/2013  Physical Exam  Nursing note and vitals reviewed. Constitutional: She is oriented to person, place, and time. She appears well-developed and well-nourished. No distress.  HENT:  Head: Normocephalic and atraumatic.  Mouth/Throat: Oropharynx is clear and moist.  Eyes: Conjunctivae and EOM are normal. Pupils are equal, round, and reactive to light.  Neck: Normal range of motion.  Cardiovascular: Normal rate, regular rhythm and normal heart sounds.   Pulmonary/Chest: Effort normal and breath sounds normal. No respiratory distress. She has no wheezes.  Abdominal: Soft.  Bowel sounds are normal. There is no tenderness. There is no guarding.  Genitourinary: Rectum normal. Rectal exam shows no external hemorrhoid, no internal hemorrhoid, no fissure, no mass, no tenderness and anal tone normal. Guaiac negative stool.  Rectal exam without external hemorrhoids or fissures; no palpable masses; brown stool on glove, no gross blood; guaiac negative  Musculoskeletal: Normal range of motion.  Neurological: She is alert and oriented to person, place, and time. She displays no tremor. She displays no seizure activity.  Skin: Skin is warm and dry. She is not diaphoretic.  Psychiatric: She has a normal mood and affect.    ED Course  Procedures (including critical care time)  CRITICAL CARE Performed by: Larene Pickett   Total critical care time: 30  Critical care time was exclusive of separately billable procedures and treating other patients.  Critical care was necessary to treat or prevent imminent or life-threatening deterioration.  Critical care was time spent personally by me on the following activities: development of treatment plan with patient and/or surrogate as well as nursing, discussions with consultants, evaluation of patient's response to treatment, examination of patient, obtaining history from patient or surrogate, ordering and performing treatments and interventions, ordering and review of laboratory studies, ordering and review of radiographic studies, pulse oximetry and re-evaluation of patient's condition.  Medications  potassium chloride 10 mEq in 100 mL IVPB (10 mEq Intravenous Transfusing/Transfer 07/24/13 2145)  aspirin tablet 325 mg (not administered)  atenolol (TENORMIN) tablet 12.5 mg (not administered)  atorvastatin (LIPITOR) tablet 40 mg (not administered)  levETIRAcetam (KEPPRA) tablet 500 mg (not administered)  lisinopril (PRINIVIL,ZESTRIL) tablet 5 mg (not administered)  heparin injection 5,000 Units (not administered)  metFORMIN  (GLUCOPHAGE) tablet 500 mg (not administered)  potassium chloride SA (K-DUR,KLOR-CON) CR tablet 40 mEq (40 mEq Oral Given 07/24/13 2108)    Labs Review Labs Reviewed  CBC WITH DIFFERENTIAL - Abnormal; Notable for the following:    RBC 2.64 (*)    Hemoglobin 6.7 (*)    HCT 20.8 (*)    MCH 25.4 (*)    RDW 19.8 (*)    Platelets 138 (*)    All other components within normal limits  COMPREHENSIVE METABOLIC PANEL - Abnormal; Notable for the following:    Potassium 2.8 (*)    BUN 28 (*)    Albumin 3.0 (*)    Total Bilirubin <0.2 (*)    All other components within normal limits  CBC  BASIC METABOLIC PANEL  FERRITIN  IRON AND TIBC  I-STAT TROPOININ, ED  POC OCCULT BLOOD, ED  TYPE AND SCREEN  PREPARE RBC (CROSSMATCH)  ABO/RH   Imaging Review No results found.   EKG Interpretation None      MDM   Final diagnoses:  Low hemoglobin  Iron deficiency anemia  Hypokalemia   H/H low at 6.7 and 20.8 respectively, guaiac negative.  K+ low at 2.8.  Pt will be transfused 2 units, KCl replaced in ED. Discussed with hospitalist, Dr. Alcario Drought, pt will be admitted for observation.  VS stable.   Larene Pickett, PA-C 07/24/13 2158

## 2013-07-24 NOTE — ED Notes (Signed)
MD aware of patient irregular labs. hgb and K

## 2013-07-25 ENCOUNTER — Observation Stay (HOSPITAL_COMMUNITY): Payer: Medicare HMO

## 2013-07-25 DIAGNOSIS — D259 Leiomyoma of uterus, unspecified: Secondary | ICD-10-CM | POA: Diagnosis present

## 2013-07-25 DIAGNOSIS — R569 Unspecified convulsions: Secondary | ICD-10-CM

## 2013-07-25 DIAGNOSIS — E119 Type 2 diabetes mellitus without complications: Secondary | ICD-10-CM | POA: Diagnosis present

## 2013-07-25 LAB — RETICULOCYTES
RBC.: 3.41 MIL/uL — ABNORMAL LOW (ref 3.87–5.11)
RETIC COUNT ABSOLUTE: 23.9 10*3/uL (ref 19.0–186.0)
Retic Ct Pct: 0.7 % (ref 0.4–3.1)

## 2013-07-25 LAB — CBC
HCT: 26.4 % — ABNORMAL LOW (ref 36.0–46.0)
Hemoglobin: 9 g/dL — ABNORMAL LOW (ref 12.0–15.0)
MCH: 26.6 pg (ref 26.0–34.0)
MCHC: 34.1 g/dL (ref 30.0–36.0)
MCV: 78.1 fL (ref 78.0–100.0)
Platelets: UNDETERMINED 10*3/uL (ref 150–400)
RBC: 3.38 MIL/uL — ABNORMAL LOW (ref 3.87–5.11)
RDW: 17.3 % — AB (ref 11.5–15.5)
WBC: 11.4 10*3/uL — AB (ref 4.0–10.5)

## 2013-07-25 LAB — IRON AND TIBC
Iron: 299 ug/dL — ABNORMAL HIGH (ref 42–135)
UIBC: <15 ug/dL — ABNORMAL LOW (ref 125–400)

## 2013-07-25 LAB — BASIC METABOLIC PANEL
BUN: 25 mg/dL — ABNORMAL HIGH (ref 6–23)
CHLORIDE: 106 meq/L (ref 96–112)
CO2: 25 mEq/L (ref 19–32)
CREATININE: 0.52 mg/dL (ref 0.50–1.10)
Calcium: 8.5 mg/dL (ref 8.4–10.5)
GFR calc Af Amer: >90 mL/min (ref >90)
Glucose, Bld: 83 mg/dL (ref 70–99)
Potassium: 3.4 mEq/L — ABNORMAL LOW (ref 3.7–5.3)
Sodium: 142 mEq/L (ref 137–147)

## 2013-07-25 LAB — GLUCOSE, CAPILLARY
GLUCOSE-CAPILLARY: 80 mg/dL (ref 70–99)
Glucose-Capillary: 75 mg/dL (ref 70–99)
Glucose-Capillary: 89 mg/dL (ref 70–99)

## 2013-07-25 LAB — LACTATE DEHYDROGENASE: LDH: 244 U/L (ref 94–250)

## 2013-07-25 LAB — FERRITIN: FERRITIN: 23 ng/mL (ref 10–291)

## 2013-07-25 LAB — HAPTOGLOBIN: HAPTOGLOBIN: 76 mg/dL (ref 45–215)

## 2013-07-25 LAB — DIRECT ANTIGLOBULIN TEST (NOT AT ARMC)
DAT, COMPLEMENT: NEGATIVE
DAT, IGG: NEGATIVE

## 2013-07-25 LAB — TSH: TSH: 3.66 u[IU]/mL (ref 0.350–4.500)

## 2013-07-25 MED ORDER — POLYSACCHARIDE IRON COMPLEX 150 MG PO CAPS
150.0000 mg | ORAL_CAPSULE | Freq: Every day | ORAL | Status: DC
  Administered 2013-07-25: 150 mg via ORAL
  Filled 2013-07-25 (×2): qty 1

## 2013-07-25 MED ORDER — POLYSACCHARIDE IRON COMPLEX 150 MG PO CAPS: 150.0000 mg | ORAL_CAPSULE | Freq: Two times a day (BID) | ORAL | Status: DC

## 2013-07-25 MED ORDER — SENNOSIDES-DOCUSATE SODIUM 8.6-50 MG PO TABS
1.0000 | ORAL_TABLET | Freq: Every evening | ORAL | Status: DC | PRN
  Filled 2013-07-25: qty 1

## 2013-07-25 MED ORDER — SENNOSIDES-DOCUSATE SODIUM 8.6-50 MG PO TABS: 1.0000 | ORAL_TABLET | Freq: Every evening | ORAL | Status: DC | PRN

## 2013-07-25 MED ORDER — POTASSIUM CHLORIDE CRYS ER 20 MEQ PO TBCR
40.0000 meq | EXTENDED_RELEASE_TABLET | Freq: Once | ORAL | Status: AC
  Administered 2013-07-25: 40 meq via ORAL
  Filled 2013-07-25: qty 2

## 2013-07-25 NOTE — Progress Notes (Signed)
CSw set up transportation home for patient. Clinical Social Worker will sign off for now as social work intervention is no longer needed. Please consult [place] again if new need arises.    Rhea Pink, MSW, [place]

## 2013-07-25 NOTE — ED Provider Notes (Signed)
Medical screening examination/treatment/procedure(s) were conducted as a shared visit with non-physician practitioner(s) and myself.  I personally evaluated the patient during the encounter.   EKG Interpretation None       Patient here from home with anemia. PCP drew labs - anemic at 6.1. Repeat here with Hgb <7. Hemoccult negative. Admitted to medicine.  Osvaldo Shipper, MD 07/25/13 Shelah Lewandowsky

## 2013-07-25 NOTE — Discharge Summary (Signed)
Physician Discharge Summary  Patient ID: [name] [name] MRN: [id number] DOB/AGE: [birth year] 58 y.o.  Admit date: 07/24/2013 Discharge date: 07/25/2013  Primary Care Physician:  Helane Rima, MD  Discharge Diagnoses:    . Symptomatic anemia . multiple Uterine fibroids . Seizure . STROKE . HYPERTENSION . Diabetes  Consults: None   Recommendations for Outpatient Follow-up:  Pelvic ultrasound showed multiple uterine fibroids, patient is interested in hysterectomy and will discuss with her primary care physician about it.  Allergies:   Allergies  Allergen Reactions  . Amoxicillin Itching  . Cephalexin Itching  . Penicillins Itching     Discharge Medications:   Medication List         aspirin 325 MG tablet  Take 325 mg by mouth daily.     atenolol 25 MG tablet  Commonly known as:  TENORMIN  Take 12.5 mg by mouth daily.     atorvastatin 40 MG tablet  Commonly known as:  LIPITOR  Take 40 mg by mouth daily at 12 noon.     iron polysaccharides 150 MG capsule  Commonly known as:  NIFEREX  Take 1 capsule (150 mg total) by mouth 2 (two) times daily.     levETIRAcetam 500 MG tablet  Commonly known as:  KEPPRA  Take 1 tablet (500 mg total) by mouth 2 (two) times daily.     lisinopril 10 MG tablet  Commonly known as:  PRINIVIL,ZESTRIL  Take 5 mg by mouth daily.     metFORMIN 500 MG tablet  Commonly known as:  GLUCOPHAGE  Take 500 mg by mouth daily.     senna-docusate 8.6-50 MG per tablet  Commonly known as:  Senokot-S  Take 1 tablet by mouth at bedtime as needed for mild constipation or moderate constipation.         Brief H and P: For complete details please refer to admission H and P, but in brief [name] [name] is a 58 y.o. female with PMH of iron deficiency anemia, who is sent in by her PCP to the ED for low HGB. Patient has been feeling very fatigued over the recent weeks and so went to her PCP for evaluation this morning. Labs were drawn which demonstrated  HGB of 6.1 (6.7 in ED today) and so patient was sent in to the ED.  She denies h/o GI bleed, no melena, no BRBPR, no N/V, only complaint is fatigue. No SOB, no chest pain. Work up in 2008 for Iron deficiency anemia includes negative colonoscopy and negative EGD. Stool guiac negative today in ED   Hospital Course:  Principal Problem:   Symptomatic anemia: Patient was admitted for blood transfusion, hemoglobin was 6.7, after 2 units of packed RBC 9.0. Patient had significant iron deficiency with ferritin of 23. Hemolytic labs were essentially negative. Patient reported that she's been having menorrhagia and she has known history of uterine fibroids. Pelvic and transvaginal ultrasound was done which showed multiple enlarged uterine fibroids. Patient was started on iron replacement. She is interested in having hysterectomy and wants to discuss with her primary care physician about it. Fecal occult blood test was negative. She had no symptoms off any melanotic stools or hematemesis. Patient is back to her baseline and cleared to be discharged.    Day of Discharge BP 133/78  Pulse 60  Temp(Src) 97.8 F (36.6 C) (Oral)  Resp 20  Ht 5\' 7"  (1.702 m)  Wt 55.838 kg (123 lb 1.6 oz)  BMI 19.28 kg/m2  SpO2 100%  LMP 06/24/2013  Physical Exam: General: Alert and awake oriented x3 not in any acute distress. HEENT: anicteric sclera, pupils reactive to light and accommodation CVS: S1-S2 clear no murmur rubs or gallops Chest: clear to auscultation bilaterally, no wheezing rales or rhonchi Abdomen: soft nontender, nondistended, normal bowel sounds Extremities: no cyanosis, clubbing or edema noted bilaterally Neuro: Cranial nerves II-XII intact, no focal neurological deficits   The results of significant diagnostics from this hospitalization (including imaging, microbiology, ancillary and laboratory) are listed below for reference.    LAB RESULTS: Basic Metabolic Panel:  Recent Labs Lab  07/24/13 1935 07/25/13 0830  NA 144 142  K 2.8* 3.4*  CL 105 106  CO2 26 25  GLUCOSE 98 83  BUN 28* 25*  CREATININE 0.61 0.52  CALCIUM 9.1 8.5   Liver Function Tests:  Recent Labs Lab 07/24/13 1935  AST 24  ALT 18  ALKPHOS 59  BILITOT <0.2*  PROT 6.7  ALBUMIN 3.0*   No results found for this basename: LIPASE, AMYLASE,  in the last 168 hours No results found for this basename: AMMONIA,  in the last 168 hours CBC:  Recent Labs Lab 07/24/13 1935 07/25/13 0830  WBC 7.1 11.4*  NEUTROABS 4.9  --   HGB 6.7* 9.0*  HCT 20.8* 26.4*  MCV 78.8 78.1  PLT 138* PLATELET CLUMPS NOTED ON SMEAR, UNABLE TO ESTIMATE   Cardiac Enzymes: No results found for this basename: CKTOTAL, CKMB, CKMBINDEX, TROPONINI,  in the last 168 hours BNP: No components found with this basename: POCBNP,  CBG:  Recent Labs Lab 07/25/13 0809 07/25/13 1209  GLUCAP 75 89    Significant Diagnostic Studies:  No results found.  2D ECHO:   Disposition and Follow-up: Discharge Orders   Future Appointments Provider Department Dept Phone   08/01/2013 4:40 PM [facility]  IMAGING [phone]   Please wear two piece clothing and wear no powder or deodorant. Please arrive 15 minutes early prior to your appointment time.   Future Orders Complete By Expires   Diet Carb Modified  As directed    Increase activity slowly  As directed        DISPOSITION: Home  DIET: Carb modified    DISCHARGE FOLLOW-UP Follow-up Information   Follow up with Helane Rima, MD. Schedule an appointment as soon as possible for a visit in 2 weeks. (for hospital follow-up)    Specialty:  Family Medicine   Contact information:   [address] [place] [address] [phone]       Time spent on Discharge: 40 mins  Signed:   Elyssia Strausser M.D. [facility] 07/25/2013, 4:30 PM Pager: [phone]

## 2013-07-25 NOTE — Progress Notes (Signed)
UR completed 

## 2013-07-26 LAB — TYPE AND SCREEN
ABO/RH(D): A NEG
Antibody Screen: NEGATIVE
Unit division: 0
Unit division: 0

## 2013-07-26 LAB — FOLATE: Folate: 6.8 ng/mL

## 2013-07-26 LAB — VITAMIN B12: VITAMIN B 12: 690 pg/mL (ref 211–911)

## 2013-08-01 ENCOUNTER — Ambulatory Visit: Payer: Medicare PPO

## 2013-08-01 ENCOUNTER — Other Ambulatory Visit: Payer: Self-pay

## 2013-08-01 ENCOUNTER — Ambulatory Visit: Payer: Medicare HMO

## 2013-08-01 DIAGNOSIS — Z1231 Encounter for screening mammogram for malignant neoplasm of breast: Secondary | ICD-10-CM

## 2013-08-08 ENCOUNTER — Encounter: Payer: Self-pay | Admitting: Obstetrics & Gynecology

## 2013-08-17 ENCOUNTER — Ambulatory Visit: Payer: Self-pay | Admitting: Obstetrics & Gynecology

## 2013-10-05 ENCOUNTER — Telehealth: Payer: Self-pay | Admitting: Neurology

## 2013-10-05 ENCOUNTER — Ambulatory Visit: Payer: Medicare HMO | Admitting: Neurology

## 2013-10-05 NOTE — Telephone Encounter (Signed)
This patient did not show for a new patient appointment today. 

## 2013-10-16 ENCOUNTER — Telehealth: Payer: Self-pay | Admitting: Neurology

## 2013-10-16 ENCOUNTER — Ambulatory Visit: Payer: Medicare HMO | Admitting: Neurology

## 2013-10-16 NOTE — Telephone Encounter (Signed)
This is the second new patient no showed for this patient. No further appointments to be scheduled.

## 2013-10-16 NOTE — Telephone Encounter (Signed)
Per Dr. Jannifer Franklin this patient has no-showed the past 2 new patient appointment. He does not want this patient seen by this office. No further appointments!

## 2013-10-18 ENCOUNTER — Encounter: Payer: Self-pay | Admitting: Neurology

## 2013-10-25 ENCOUNTER — Ambulatory Visit
Admission: RE | Admit: 2013-10-25 | Discharge: 2013-10-25 | Disposition: A | Discharge status: Home / Self Care | Payer: Medicare HMO | Source: Ambulatory Visit

## 2013-10-25 DIAGNOSIS — Z1231 Encounter for screening mammogram for malignant neoplasm of breast: Secondary | ICD-10-CM

## 2013-10-31 ENCOUNTER — Other Ambulatory Visit: Payer: Self-pay | Admitting: *Deleted

## 2013-11-02 ENCOUNTER — Encounter: Payer: Self-pay | Admitting: Neurology

## 2013-11-02 ENCOUNTER — Ambulatory Visit (INDEPENDENT_AMBULATORY_CARE_PROVIDER_SITE_OTHER): Payer: Medicare HMO | Admitting: Neurology

## 2013-11-02 VITALS — BP 126/78 | HR 62 | Ht 67.0 in | Wt 125.0 lb

## 2013-11-02 DIAGNOSIS — R569 Unspecified convulsions: Secondary | ICD-10-CM

## 2013-11-02 MED ORDER — BACLOFEN 10 MG PO TABS: ORAL_TABLET | ORAL | Status: DC

## 2013-11-02 NOTE — Progress Notes (Signed)
Reason for visit: Seizures  [name] [name] is a 58 y.o. female  History of present illness:  [name] [name] is a 58 year old right-handed black female with a history of seizures following a stroke event that occurred in 1989. The patient sustained an anterior cerebral artery infarct bilaterally resulting in a significant frontal deficit. The patient has had weakness of both legs, and she lost her ability to ambulate at that time. She has bowel and bladder incontinence as well. The seizures have more recently been treated with Keppra. The last seizure was on 06/11/2013. The patient went to the emergency room for this event. The patient had an event of altered mental status, staring off, lasting about one half hour. The patient does not know that she has a seizure unless someone tells her. The patient reports some weakness in arms, right greater than left. She denies any headaches. She does have a history of diabetes. She indicates that at home, she is able to stand for transfers. She has a lot of stiffness and spasticity in the legs, not able to flex at the knees. She indicates that she needs minimal assistance for bathing and dressing. She is not functionally ambulatory. The patient had the seizure in February 2015, not on seizure medications. Currently, she is on Keppra, taking 1000 mg twice daily, tolerating the medication well. She has had a CT scan of the head showing chronic severe bilateral frontal encephalomalacia.  Past Medical History  Diagnosis Date  . Uterine fibroid   . Gastritis   . Hiatal hernia   . Hemorrhoids   . Anemia   . Stroke 12 1989  . Dyslipidemia   . Hypertension   . Seizure disorder   . Carpal tunnel syndrome   . Depression   . Osteoporosis   . Urinary incontinence   . Iron deficiency anemia 2008    negative EGD and colonoscopy  . Diabetes mellitus     Past Surgical History  Procedure Laterality Date  . Colonoscopy  2008  . Upper gastrointestinal endoscopy  2008   . Tracheostomy    . Breast surgery  1977  . Tubal ligation    . Cesarean section      x2    Family History  Problem Relation Age of Onset  . Heart disease Paternal Grandfather   . Hypertension Paternal Grandfather   . Prostate cancer Paternal Uncle   . Ovarian cancer Mother   . Diabetes Cousin   . Colon cancer Neg Hx   . Hypertension Maternal Grandmother   . Stroke Maternal Grandmother   . Diabetes Brother   . Heart attack Brother     Social history:  reports that she has never smoked. She has never used smokeless tobacco. She reports that she does not drink alcohol or use illicit drugs.  Medications:  Current Outpatient Prescriptions on File Prior to Visit  Medication Sig Dispense Refill  . aspirin 325 MG tablet Take 325 mg by mouth daily.      Marland Kitchen atenolol (TENORMIN) 25 MG tablet Take 25 mg by mouth daily.       Marland Kitchen atorvastatin (LIPITOR) 40 MG tablet Take 40 mg by mouth daily at 12 noon.       . iron polysaccharides (NIFEREX) 150 MG capsule Take 1 capsule (150 mg total) by mouth 2 (two) times daily.  60 capsule  3  . lisinopril (PRINIVIL,ZESTRIL) 10 MG tablet Take 5 mg by mouth daily.       . Misc. Devices (  WHEELCHAIR) MISC 1 Device by Does not apply route daily.      Marland Kitchen senna-docusate (SENOKOT-S) 8.6-50 MG per tablet Take 1 tablet by mouth at bedtime as needed for mild constipation or moderate constipation.  30 tablet  3  . traMADol (ULTRAM) 50 MG tablet Take 50 mg by mouth every 8 (eight) hours as needed.       No current facility-administered medications on file prior to visit.      Allergies  Allergen Reactions  . Amoxicillin Itching  . Cephalexin Itching  . Penicillins Itching    ROS:  Out of a complete 14 system review of symptoms, the patient complains only of the following symptoms, and all other reviewed systems are negative.  Weight loss, fatigue Swelling in the legs Moles Blurred vision Incontinence, and diarrhea Urinary incontinence Anemia, easy  bruising Allergies Memory loss, confusion, seizures Not enough sleep, decreased energy, change in appetite  Blood pressure 126/78, pulse 62, height 5\' 7"  (1.702 m), weight 125 lb (56.7 kg).  Physical Exam  General: The patient is alert and cooperative at the time of the examination.  Eyes: Pupils are equal, round, and reactive to light. Discs are flat bilaterally.  Neck: The neck is supple, no carotid bruits are noted.  Respiratory: The respiratory examination is clear.  Cardiovascular: The cardiovascular examination reveals a regular rate and rhythm, no obvious murmurs or rubs are noted.  Skin: Extremities are without significant edema.  Neurologic Exam  Mental status: The patient is alert and oriented x 3 at the time of the examination. The patient has apparent normal recent and remote memory, with an apparently normal attention span and concentration ability.  Cranial nerves: Facial symmetry is present. There is good sensation of the face to pinprick and soft touch bilaterally. The strength of the facial muscles and the muscles to head turning and shoulder shrug are normal bilaterally. Speech is well enunciated, no aphasia or dysarthria is noted. Extraocular movements are full. Visual fields are full. The tongue is midline, and the patient has symmetric elevation of the soft palate. No obvious hearing deficits are noted.  Motor: The motor testing reveals 5 over 5 strength of the upper extremities. With the lower extremities, the patient has increased tone throughout, inability to flex and extend at the knees or ankles. Bilateral foot drops are noted.  Sensory: Sensory testing is intact to pinprick, soft touch, vibration sensation, and position sense on all 4 extremities, with exception that position sense is decreased on the right foot. No evidence of extinction is noted.  Coordination: Cerebellar testing reveals good finger-nose-finger bilaterally. The patient is unable to perform  the shin with the lower extremities.  Gait and station: The patient is nonambulatory, wheelchair-bound.  Reflexes: Deep tendon reflexes are symmetric bilaterally, ankle jerk reflexes are depressed bilaterally. Toes are upgoing bilaterally.    CT brain 06/10/2013:  FINDINGS:  Marked bifrontal encephalomalacia likely related to old trauma or  possible vasospasm with bilateral anterior cerebral artery infarcts.  No acute stroke or hemorrhage. No mass lesion or extra-axial fluid.  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 Bilateral  cerebellar atrophy. Calvarium intact. No sinus or mastoid disease.  Similar appearance to priors.  IMPRESSION:  Chronic changes as described. No acute intracranial findings.    Assessment/Plan:  1. History of stroke, bifrontal encephalomalacia  2. Seizures secondary to #1  3. Gait disturbance, bilateral lower extremity spasticity  The patient will be placed on low-dose baclofen for the spasticity in the legs.  She will continue the Keppra at 1000 mg twice daily. The patient will followup through this office in about 4 months. The patient will contact our office if she continues to have further issues with the seizures.  Jill Alexanders MD 11/02/2013 7:47 PM  [facility] [address] [place],  [address]  Phone [phone] Fax [phone]

## 2013-11-02 NOTE — Patient Instructions (Signed)

## 2014-02-21 ENCOUNTER — Encounter (HOSPITAL_COMMUNITY): Payer: Self-pay | Admitting: Emergency Medicine

## 2014-02-21 ENCOUNTER — Inpatient Hospital Stay (HOSPITAL_COMMUNITY)
Admission: EM | Admit: 2014-02-21 | Discharge: 2014-02-23 | DRG: 100 | Disposition: A | Discharge status: Home / Self Care | Payer: Medicare HMO | Attending: Internal Medicine | Admitting: Internal Medicine

## 2014-02-21 DIAGNOSIS — R4182 Altered mental status, unspecified: Secondary | ICD-10-CM

## 2014-02-21 DIAGNOSIS — E119 Type 2 diabetes mellitus without complications: Secondary | ICD-10-CM

## 2014-02-21 DIAGNOSIS — Z880 Allergy status to penicillin: Secondary | ICD-10-CM

## 2014-02-21 DIAGNOSIS — Z8673 Personal history of transient ischemic attack (TIA), and cerebral infarction without residual deficits: Secondary | ICD-10-CM

## 2014-02-21 DIAGNOSIS — E43 Unspecified severe protein-calorie malnutrition: Secondary | ICD-10-CM | POA: Insufficient documentation

## 2014-02-21 DIAGNOSIS — Z681 Body mass index (BMI) 19 or less, adult: Secondary | ICD-10-CM

## 2014-02-21 DIAGNOSIS — G9389 Other specified disorders of brain: Secondary | ICD-10-CM | POA: Diagnosis present

## 2014-02-21 DIAGNOSIS — G40309 Generalized idiopathic epilepsy and epileptic syndromes, not intractable, without status epilepticus: Principal | ICD-10-CM | POA: Diagnosis present

## 2014-02-21 DIAGNOSIS — E785 Hyperlipidemia, unspecified: Secondary | ICD-10-CM | POA: Diagnosis present

## 2014-02-21 DIAGNOSIS — R404 Transient alteration of awareness: Secondary | ICD-10-CM

## 2014-02-21 DIAGNOSIS — D649 Anemia, unspecified: Secondary | ICD-10-CM | POA: Diagnosis present

## 2014-02-21 DIAGNOSIS — Z7982 Long term (current) use of aspirin: Secondary | ICD-10-CM

## 2014-02-21 DIAGNOSIS — F329 Major depressive disorder, single episode, unspecified: Secondary | ICD-10-CM | POA: Diagnosis present

## 2014-02-21 DIAGNOSIS — G9340 Encephalopathy, unspecified: Secondary | ICD-10-CM | POA: Diagnosis present

## 2014-02-21 DIAGNOSIS — I10 Essential (primary) hypertension: Secondary | ICD-10-CM | POA: Diagnosis present

## 2014-02-21 DIAGNOSIS — I69354 Hemiplegia and hemiparesis following cerebral infarction affecting left non-dominant side: Secondary | ICD-10-CM

## 2014-02-21 DIAGNOSIS — Z79899 Other long term (current) drug therapy: Secondary | ICD-10-CM

## 2014-02-21 DIAGNOSIS — M810 Age-related osteoporosis without current pathological fracture: Secondary | ICD-10-CM | POA: Diagnosis present

## 2014-02-21 DIAGNOSIS — R569 Unspecified convulsions: Secondary | ICD-10-CM | POA: Diagnosis not present

## 2014-02-21 DIAGNOSIS — Z881 Allergy status to other antibiotic agents status: Secondary | ICD-10-CM

## 2014-02-21 HISTORY — DX: Unspecified convulsions: R56.9

## 2014-02-21 LAB — COMPREHENSIVE METABOLIC PANEL
ALK PHOS: 107 U/L (ref 39–117)
ALT: 31 U/L (ref 0–35)
AST: 33 U/L (ref 0–37)
Albumin: 3.4 g/dL — ABNORMAL LOW (ref 3.5–5.2)
Anion gap: 16 — ABNORMAL HIGH (ref 5–15)
BILIRUBIN TOTAL: 0.2 mg/dL — AB (ref 0.3–1.2)
BUN: 26 mg/dL — ABNORMAL HIGH (ref 6–23)
CHLORIDE: 101 meq/L (ref 96–112)
CO2: 25 mEq/L (ref 19–32)
CREATININE: 0.72 mg/dL (ref 0.50–1.10)
Calcium: 9.3 mg/dL (ref 8.4–10.5)
GFR calc Af Amer: >90 mL/min (ref >90)
Glucose, Bld: 115 mg/dL — ABNORMAL HIGH (ref 70–99)
POTASSIUM: 3.6 meq/L — AB (ref 3.7–5.3)
Sodium: 142 mEq/L (ref 137–147)
Total Protein: 7.5 g/dL (ref 6.0–8.3)

## 2014-02-21 LAB — CBC WITH DIFFERENTIAL/PLATELET
BASOS ABS: 0 10*3/uL (ref 0.0–0.1)
Basophils Relative: 0 % (ref 0–1)
Eosinophils Absolute: 0.1 10*3/uL (ref 0.0–0.7)
Eosinophils Relative: 1 % (ref 0–5)
HEMATOCRIT: 35.3 % — AB (ref 36.0–46.0)
Hemoglobin: 11.8 g/dL — ABNORMAL LOW (ref 12.0–15.0)
LYMPHS PCT: 28 % (ref 12–46)
Lymphs Abs: 3.1 10*3/uL (ref 0.7–4.0)
MCH: 31.6 pg (ref 26.0–34.0)
MCHC: 33.4 g/dL (ref 30.0–36.0)
MCV: 94.4 fL (ref 78.0–100.0)
MONO ABS: 0.5 10*3/uL (ref 0.1–1.0)
Monocytes Relative: 5 % (ref 3–12)
Neutro Abs: 7.4 10*3/uL (ref 1.7–7.7)
Neutrophils Relative %: 66 % (ref 43–77)
Platelets: DECREASED 10*3/uL (ref 150–400)
RBC: 3.74 MIL/uL — ABNORMAL LOW (ref 3.87–5.11)
RDW: 13.6 % (ref 11.5–15.5)
WBC: 11.2 10*3/uL — ABNORMAL HIGH (ref 4.0–10.5)

## 2014-02-21 LAB — CBG MONITORING, ED: Glucose-Capillary: 115 mg/dL — ABNORMAL HIGH (ref 70–99)

## 2014-02-21 LAB — VALPROIC ACID LEVEL

## 2014-02-21 MED ORDER — LORAZEPAM 2 MG/ML IJ SOLN
1.0000 mg | Freq: Once | INTRAMUSCULAR | Status: AC
  Administered 2014-02-21: 1 mg via INTRAVENOUS
  Filled 2014-02-21: qty 1

## 2014-02-21 NOTE — ED Notes (Signed)
2 witnessed seizures by EMS; post ictal in between. No meds given per EMS. 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 Hx epilepsy.

## 2014-02-21 NOTE — ED Notes (Signed)
Family reports after stroke pt has had R sided deficits.

## 2014-02-21 NOTE — ED Provider Notes (Signed)
CSN: [id number]     Arrival date & time 02/21/14  2222 History   First MD Initiated Contact with Patient 02/21/14 2226     Chief Complaint  Patient presents with  . Seizures   Patient is a 58 y.o. female presenting with seizures. The history is provided by the EMS personnel. The history is limited by the condition of the patient.  Seizures Seizure activity on arrival: yes   Seizure type:  Grand mal Preceding symptoms comment:  Unknown Initial focality:  Facial (head turned to left; eyes deviated to left) Episode characteristics: eye deviation, partial responsiveness and stiffening   Postictal symptoms: somnolence   Return to baseline: no   Severity:  Severe Timing:  Clustered Number of seizures this episode:  2 Progression:  Worsening Context comment:  History of seizures, CVA Recent head injury: unknown. PTA treatment:  None History of seizures: yes     Past Medical History  Diagnosis Date  . Uterine fibroid   . Gastritis   . Hiatal hernia   . Hemorrhoids   . Anemia   . Stroke 12 1989  . Dyslipidemia   . Hypertension   . Seizure disorder   . Carpal tunnel syndrome   . Depression   . Osteoporosis   . Urinary incontinence   . Iron deficiency anemia 2008    negative EGD and colonoscopy  . Diabetes mellitus    Past Surgical History  Procedure Laterality Date  . Colonoscopy  2008  . Upper gastrointestinal endoscopy  2008  . Tracheostomy    . Breast surgery  1977  . Tubal ligation    . Cesarean section      x2   Family History  Problem Relation Age of Onset  . Heart disease Paternal Grandfather   . Hypertension Paternal Grandfather   . Prostate cancer Paternal Uncle   . Ovarian cancer Mother   . Diabetes Cousin   . Colon cancer Neg Hx   . Hypertension Maternal Grandmother   . Stroke Maternal Grandmother   . Diabetes Brother   . Heart attack Brother    History  Substance Use Topics  . Smoking status: Never Smoker   . Smokeless tobacco: Never Used  .  Alcohol Use: No   OB History   Grav Para Term Preterm Abortions TAB SAB Ect Mult Living                 Review of Systems  Unable to perform ROS: Patient unresponsive  Neurological: Positive for seizures.    Allergies  Amoxicillin; Cephalexin; and Penicillins  Home Medications   Prior to Admission medications   Medication Sig Start Date End Date Taking? Authorizing Provider  aspirin 325 MG tablet Take 325 mg by mouth daily.    Historical Provider, MD  atenolol (TENORMIN) 25 MG tablet Take 25 mg by mouth daily.     Historical Provider, MD  atorvastatin (LIPITOR) 40 MG tablet Take 40 mg by mouth daily at 12 noon.     Historical Provider, MD  baclofen (LIORESAL) 10 MG tablet One half tablet twice a day for 2 weeks, then take 1/2 tablet three times a day 11/02/13   Kathrynn Ducking, MD  iron polysaccharides (NIFEREX) 150 MG capsule Take 1 capsule (150 mg total) by mouth 2 (two) times daily. 07/25/13   Ripudeep Krystal Eaton, MD  levETIRAcetam (KEPPRA) 1000 MG tablet Take 1,000 mg by mouth 2 (two) times daily.    Historical Provider, MD  lisinopril (PRINIVIL,ZESTRIL) 10  MG tablet Take 5 mg by mouth daily.     Historical Provider, MD  Misc. Devices [facility]) MISC 1 Device by Does not apply route daily. 10/13/13   Historical Provider, MD  senna-docusate (SENOKOT-S) 8.6-50 MG per tablet Take 1 tablet by mouth at bedtime as needed for mild constipation or moderate constipation. 07/25/13   Ripudeep Krystal Eaton, MD  traMADol (ULTRAM) 50 MG tablet Take 50 mg by mouth every 8 (eight) hours as needed. 08/21/13   Historical Provider, MD   BP 150/96  Pulse 110  Resp 16  SpO2 95%  LMP 06/24/2013  Physical Exam  Vitals reviewed. Constitutional: She appears well-developed and well-nourished.  58 y.o. Female, head strained to left, drooling  HENT:  Head: Normocephalic and atraumatic.  Right Ear: External ear normal.  Left Ear: External ear normal.  Mouth/Throat: Oropharynx is clear and moist.  Eyes: Pupils  are equal, round, and reactive to light.  Neck: Normal range of motion.  Cardiovascular: Regular rhythm.  Tachycardia present.   Pulses:      Radial pulses are 2+ on the right side, and 2+ on the left side.  Pulmonary/Chest: Effort normal and breath sounds normal. No respiratory distress. She has no decreased breath sounds. She has no wheezes.  Abdominal: Soft. She exhibits no distension. There is no tenderness. There is no rebound and no guarding.  Neurological: She is unresponsive. GCS eye subscore is 4. GCS verbal subscore is 4. GCS motor subscore is 6.  Seizure activity on arrival; moving right upper extremity spontaneously and to command; not noted to move bilateral lower extremities or left upper extremity  Skin: Skin is warm and dry. No rash noted. She is not diaphoretic.    ED Course  Procedures  Labs Review  Results for orders placed during the hospital encounter of 02/21/14  CBC WITH DIFFERENTIAL      Result Value Ref Range   WBC 11.2 (*) 4.0 - 10.5 K/uL   RBC 3.74 (*) 3.87 - 5.11 MIL/uL   Hemoglobin 11.8 (*) 12.0 - 15.0 g/dL   HCT 35.3 (*) 36.0 - 46.0 %   MCV 94.4  78.0 - 100.0 fL   MCH 31.6  26.0 - 34.0 pg   MCHC 33.4  30.0 - 36.0 g/dL   RDW 13.6  11.5 - 15.5 %   Platelets    150 - 400 K/uL   Value: PLATELET CLUMPS NOTED ON SMEAR, COUNT APPEARS DECREASED   Neutrophils Relative % 66  43 - 77 %   Neutro Abs 7.4  1.7 - 7.7 K/uL   Lymphocytes Relative 28  12 - 46 %   Lymphs Abs 3.1  0.7 - 4.0 K/uL   Monocytes Relative 5  3 - 12 %   Monocytes Absolute 0.5  0.1 - 1.0 K/uL   Eosinophils Relative 1  0 - 5 %   Eosinophils Absolute 0.1  0.0 - 0.7 K/uL   Basophils Relative 0  0 - 1 %   Basophils Absolute 0.0  0.0 - 0.1 K/uL  COMPREHENSIVE METABOLIC PANEL      Result Value Ref Range   Sodium 142  137 - 147 mEq/L   Potassium 3.6 (*) 3.7 - 5.3 mEq/L   Chloride 101  96 - 112 mEq/L   CO2 25  19 - 32 mEq/L   Glucose, Bld 115 (*) 70 - 99 mg/dL   BUN 26 (*) 6 - 23 mg/dL    Creatinine, Ser 0.72  0.50 - 1.10 mg/dL  Calcium 9.3  8.4 - 10.5 mg/dL   Total Protein 7.5  6.0 - 8.3 g/dL   Albumin 3.4 (*) 3.5 - 5.2 g/dL   AST 33  0 - 37 U/L   ALT 31  0 - 35 U/L   Alkaline Phosphatase 107  39 - 117 U/L   Total Bilirubin 0.2 (*) 0.3 - 1.2 mg/dL   GFR calc non Af Amer >90  >90 mL/min   GFR calc Af Amer >90  >90 mL/min   Anion gap 16 (*) 5 - 15  VALPROIC ACID LEVEL      Result Value Ref Range   Valproic Acid Lvl <10.0 (*) 50.0 - 100.0 ug/mL  CBG MONITORING, ED      Result Value Ref Range   Glucose-Capillary 115 (*) 70 - 99 mg/dL  CBG MONITORING, ED      Result Value Ref Range   Glucose-Capillary 118 (*) 70 - 99 mg/dL     Imaging Review Ct Head Wo Contrast  02/22/2014   CLINICAL DATA:  Seizure, baseline left-sided paralysis from prior CVA. History of epilepsy.  EXAM: CT HEAD WITHOUT CONTRAST  TECHNIQUE: Contiguous axial images were obtained from the base of the skull through the vertex without intravenous contrast.  COMPARISON:  06/10/2013  FINDINGS: Similar appearance of the bifrontal and high left parietal encephalomalacia. No CT evidence of an acute infarction. No intraparenchymal hemorrhage, mass, mass effect, or abnormal extra-axial fluid collection. No hydrocephalus. The visualized paranasal sinuses and mastoid air cells are predominantly clear.  IMPRESSION: Bifrontal and left parietal encephalomalacia, similar appearance to prior. No CT evidence of an acute intracranial abnormality.   Electronically Signed   By: [name] M.D.   On: 02/22/2014 01:14     MDM   Final diagnoses:  Seizure   58 y.o. female with a history of CVA (with residual left sided weakness per EMS), seizures, incontinence, DM. Presents this evening due to breakthrough seizure activity.   When daughter to bedside, stated the patient was "out of it" preceding her seizure which the daughter states is very typical of the patient's seizure history. She then had a seizure. Was  post-ictal on EMS arrival. Began seizing again upon arrival to the ED. See exam above. Given 1mg  Ativan for seizure activity. Will reassess.   Labs obtained. CBC showed mile leukocytosis and mild anemia. CMP was essentially unremarkable. Valproic acid level undetectable. CT head showed similar changes to prior with no acute intracranial abnormality.   On re-evaluation the patient is somnolent, not reacting to pain. Maintaining normal oxygen saturation on room air, heart rate 58-60. Concern for status epilepticus, CVA - neurology consulted.   Medicine consulted for admission given that she is not returning to baseline and has received only 1mg  Ativan while here.   2:45 AM Patient had some effort with grip strength of LUE; stated her name and age when asked  Will be admitted to medicine with neurology consulting.   This case managed in conjunction with my attending, Dr. Venora Maples.       Berenice Primas, MD 02/22/14 [phone]

## 2014-02-21 NOTE — ED Notes (Signed)
Pt is talking and communicative.

## 2014-02-22 ENCOUNTER — Encounter (HOSPITAL_COMMUNITY): Payer: Self-pay | Admitting: General Practice

## 2014-02-22 ENCOUNTER — Emergency Department (HOSPITAL_COMMUNITY): Payer: Medicare HMO

## 2014-02-22 ENCOUNTER — Inpatient Hospital Stay (HOSPITAL_COMMUNITY): Payer: Medicare HMO

## 2014-02-22 DIAGNOSIS — E119 Type 2 diabetes mellitus without complications: Secondary | ICD-10-CM

## 2014-02-22 DIAGNOSIS — Z7982 Long term (current) use of aspirin: Secondary | ICD-10-CM | POA: Diagnosis not present

## 2014-02-22 DIAGNOSIS — M810 Age-related osteoporosis without current pathological fracture: Secondary | ICD-10-CM | POA: Diagnosis present

## 2014-02-22 DIAGNOSIS — Z681 Body mass index (BMI) 19 or less, adult: Secondary | ICD-10-CM | POA: Diagnosis not present

## 2014-02-22 DIAGNOSIS — I10 Essential (primary) hypertension: Secondary | ICD-10-CM | POA: Diagnosis present

## 2014-02-22 DIAGNOSIS — E43 Unspecified severe protein-calorie malnutrition: Secondary | ICD-10-CM | POA: Diagnosis present

## 2014-02-22 DIAGNOSIS — G9389 Other specified disorders of brain: Secondary | ICD-10-CM | POA: Diagnosis present

## 2014-02-22 DIAGNOSIS — E785 Hyperlipidemia, unspecified: Secondary | ICD-10-CM | POA: Diagnosis present

## 2014-02-22 DIAGNOSIS — Z880 Allergy status to penicillin: Secondary | ICD-10-CM | POA: Diagnosis not present

## 2014-02-22 DIAGNOSIS — I69354 Hemiplegia and hemiparesis following cerebral infarction affecting left non-dominant side: Secondary | ICD-10-CM | POA: Diagnosis not present

## 2014-02-22 DIAGNOSIS — Z79899 Other long term (current) drug therapy: Secondary | ICD-10-CM | POA: Diagnosis not present

## 2014-02-22 DIAGNOSIS — R569 Unspecified convulsions: Secondary | ICD-10-CM

## 2014-02-22 DIAGNOSIS — Z881 Allergy status to other antibiotic agents status: Secondary | ICD-10-CM | POA: Diagnosis not present

## 2014-02-22 DIAGNOSIS — G9340 Encephalopathy, unspecified: Secondary | ICD-10-CM | POA: Diagnosis present

## 2014-02-22 DIAGNOSIS — Z8673 Personal history of transient ischemic attack (TIA), and cerebral infarction without residual deficits: Secondary | ICD-10-CM

## 2014-02-22 DIAGNOSIS — F329 Major depressive disorder, single episode, unspecified: Secondary | ICD-10-CM | POA: Diagnosis present

## 2014-02-22 DIAGNOSIS — D649 Anemia, unspecified: Secondary | ICD-10-CM | POA: Diagnosis present

## 2014-02-22 DIAGNOSIS — G40309 Generalized idiopathic epilepsy and epileptic syndromes, not intractable, without status epilepticus: Secondary | ICD-10-CM | POA: Diagnosis present

## 2014-02-22 LAB — URINALYSIS, ROUTINE W REFLEX MICROSCOPIC
BILIRUBIN URINE: NEGATIVE
GLUCOSE, UA: NEGATIVE mg/dL
HGB URINE DIPSTICK: NEGATIVE
Ketones, ur: NEGATIVE mg/dL
Nitrite: POSITIVE — AB
PH: 6.5 (ref 5.0–8.0)
Protein, ur: NEGATIVE mg/dL
SPECIFIC GRAVITY, URINE: 1.02 (ref 1.005–1.030)
UROBILINOGEN UA: 0.2 mg/dL (ref 0.0–1.0)

## 2014-02-22 LAB — URINE MICROSCOPIC-ADD ON

## 2014-02-22 LAB — TSH: TSH: 3.86 u[IU]/mL (ref 0.350–4.500)

## 2014-02-22 LAB — AMMONIA: Ammonia: 65 umol/L — ABNORMAL HIGH (ref 11–60)

## 2014-02-22 LAB — APTT: APTT: 40 s — AB (ref 24–37)

## 2014-02-22 LAB — RAPID URINE DRUG SCREEN, HOSP PERFORMED
AMPHETAMINES: NOT DETECTED
BARBITURATES: NOT DETECTED
BENZODIAZEPINES: NOT DETECTED
Cocaine: NOT DETECTED
OPIATES: NOT DETECTED
TETRAHYDROCANNABINOL: NOT DETECTED

## 2014-02-22 LAB — GLUCOSE, CAPILLARY
GLUCOSE-CAPILLARY: 75 mg/dL (ref 70–99)
Glucose-Capillary: 80 mg/dL (ref 70–99)
Glucose-Capillary: 82 mg/dL (ref 70–99)
Glucose-Capillary: 170 mg/dL — ABNORMAL HIGH (ref 70–99)

## 2014-02-22 LAB — CBG MONITORING, ED
GLUCOSE-CAPILLARY: 120 mg/dL — AB (ref 70–99)
Glucose-Capillary: 118 mg/dL — ABNORMAL HIGH (ref 70–99)

## 2014-02-22 MED ORDER — LEVETIRACETAM IN NACL 500 MG/100ML IV SOLN
500.0000 mg | Freq: Once | INTRAVENOUS | Status: AC
  Administered 2014-02-22: 500 mg via INTRAVENOUS
  Filled 2014-02-22: qty 100

## 2014-02-22 MED ORDER — LEVETIRACETAM IN NACL 1000 MG/100ML IV SOLN
1000.0000 mg | Freq: Once | INTRAVENOUS | Status: AC
  Administered 2014-02-22: 1000 mg via INTRAVENOUS
  Filled 2014-02-22: qty 100

## 2014-02-22 MED ORDER — ATENOLOL 25 MG PO TABS
25.0000 mg | ORAL_TABLET | Freq: Every day | ORAL | Status: DC
  Administered 2014-02-23: 25 mg via ORAL
  Filled 2014-02-22 (×2): qty 1

## 2014-02-22 MED ORDER — LISINOPRIL 5 MG PO TABS
5.0000 mg | ORAL_TABLET | Freq: Every day | ORAL | Status: DC
  Administered 2014-02-23: 5 mg via ORAL
  Filled 2014-02-22 (×2): qty 1

## 2014-02-22 MED ORDER — POLYSACCHARIDE IRON COMPLEX 150 MG PO CAPS
150.0000 mg | ORAL_CAPSULE | Freq: Two times a day (BID) | ORAL | Status: DC
  Administered 2014-02-22 – 2014-02-23 (×2): 150 mg via ORAL
  Filled 2014-02-22 (×4): qty 1

## 2014-02-22 MED ORDER — ENSURE COMPLETE PO LIQD
237.0000 mL | Freq: Two times a day (BID) | ORAL | Status: DC
  Administered 2014-02-22 – 2014-02-23 (×3): 237 mL via ORAL

## 2014-02-22 MED ORDER — LEVETIRACETAM 750 MG PO TABS
1500.0000 mg | ORAL_TABLET | Freq: Two times a day (BID) | ORAL | Status: DC
  Administered 2014-02-22 – 2014-02-23 (×2): 1500 mg via ORAL
  Filled 2014-02-22 (×3): qty 2

## 2014-02-22 MED ORDER — ASPIRIN 325 MG PO TABS
325.0000 mg | ORAL_TABLET | Freq: Every day | ORAL | Status: DC
  Administered 2014-02-23: 325 mg via ORAL
  Filled 2014-02-22 (×2): qty 1

## 2014-02-22 MED ORDER — HEPARIN SODIUM (PORCINE) 5000 UNIT/ML IJ SOLN
5000.0000 [IU] | Freq: Three times a day (TID) | INTRAMUSCULAR | Status: DC
  Administered 2014-02-22: 5000 [IU] via SUBCUTANEOUS
  Filled 2014-02-22: qty 1

## 2014-02-22 MED ORDER — ATORVASTATIN CALCIUM 40 MG PO TABS
40.0000 mg | ORAL_TABLET | Freq: Every day | ORAL | Status: DC
  Administered 2014-02-23: 40 mg via ORAL
  Filled 2014-02-22 (×2): qty 1

## 2014-02-22 MED ORDER — GADOBENATE DIMEGLUMINE 529 MG/ML IV SOLN: 10.0000 mL | Freq: Once | INTRAVENOUS | Status: DC

## 2014-02-22 MED ORDER — DEXTROSE 5 % IV SOLN
1.0000 g | INTRAVENOUS | Status: DC
  Administered 2014-02-22: 1 g via INTRAVENOUS
  Filled 2014-02-22 (×2): qty 10

## 2014-02-22 NOTE — Progress Notes (Signed)
Attempted to get CBG this am. Two RNs with 5 sticks with no results. Applied heat pack to hand and will have NT try a little later.

## 2014-02-22 NOTE — Progress Notes (Signed)
INITIAL NUTRITION ASSESSMENT  DOCUMENTATION CODES Per approved criteria  -Underweight   INTERVENTION: Ensure Complete po BID, each supplement provides 350 kcal and 13 grams of protein RD to follow for nutrition care plan  NUTRITION DIAGNOSIS: Inadequate oral intake related to lethargy as evidenced by PO intake 0%  Goal: Pt to meet >/= 90% of their estimated nutrition needs   Monitor:  PO intake, weight, labs, I/O's  Reason for Assessment: Malnutrition Screening Tool Report  58 y.o. female  Admitting Dx: Seizure  ASSESSMENT: 58 y.o. Female h/o strokes and seizures. Patient has not had seizure since Feb of this year. She is on Keppra chronically. Seizures normally consist of starring off into space. Tonight her seizure started out as the usual "being spaced out" but then progressed into jerking of her extremities. After 2 of these episodes at home, EMS was called. In transport patient had another seizure, was post-ictal on presentation here.  RD unable to obtain nutrition hx.  Pt sleeping.  Identified on the Malnutrition Screening Tool Report.  Per office visit notes, pt has hx of weight loss and change in appetite.  PO intake 0% at this time.  Pt meets criteria for underweight status.  Per wt readings, pt has had weight loss off 17% since July 2015 (severe for time frame).  Will add oral nutrition supplements.  RD unable to complete Nutrition Focused Physical Exam at this time.  Height: Ht Readings from Last 1 Encounters:  11/02/13 5\' 7"  (1.702 m)    Weight: Wt Readings from Last 1 Encounters:  02/22/14 104 lb 6.4 oz (47.356 kg)    Ideal Body Weight: 135 lb  % Ideal Body Weight: 77%  Wt Readings from Last 10 Encounters:  02/22/14 104 lb 6.4 oz (47.356 kg)  11/02/13 125 lb (56.7 kg)  07/24/13 123 lb 1.6 oz (55.838 kg)  07/14/11 260 lb (117.935 kg)    Usual Body Weight: 125 lb -- July 2015  % Usual Body Weight: 83%  BMI:  Body mass index is 16.35  kg/(m^2).  Estimated Nutritional Needs: Kcal: 1500-1700 Protein: 70-80 gm Fluid: 1.5-1.7 L  Skin: Intact  Diet Order: Heart Healthy/Carbohydrate Modified  EDUCATION NEEDS: -No education needs identified at this time   Intake/Output Summary (Last 24 hours) at 02/22/14 1201 Last data filed at 02/22/14 0900  Gross per 24 hour  Intake    100 ml  Output      0 ml  Net    100 ml    Labs:   Recent Labs Lab 02/21/14 2224  NA 142  K 3.6*  CL 101  CO2 25  BUN 26*  CREATININE 0.72  CALCIUM 9.3  GLUCOSE 115*    CBG (last 3)   Recent Labs  02/22/14 0153 02/22/14 0230 02/22/14 1016  GLUCAP 118* 120* 80    Scheduled Meds: . aspirin  325 mg Oral Daily  . atenolol  25 mg Oral Daily  . atorvastatin  40 mg Oral Q1200  . gadobenate dimeglumine  10 mL Intravenous Once  . iron polysaccharides  150 mg Oral BID  . levETIRAcetam  1,500 mg Oral BID  . lisinopril  5 mg Oral Daily    Continuous Infusions:   Past Medical History  Diagnosis Date  . Uterine fibroid   . Gastritis   . Hiatal hernia   . Hemorrhoids   . Anemia   . Stroke 12 1989  . Dyslipidemia   . Hypertension   . Seizure disorder   . Carpal  tunnel syndrome   . Depression   . Osteoporosis   . Urinary incontinence   . Iron deficiency anemia 2008    negative EGD and colonoscopy  . Diabetes mellitus     Past Surgical History  Procedure Laterality Date  . Colonoscopy  2008  . Upper gastrointestinal endoscopy  2008  . Tracheostomy    . Breast surgery  1977  . Tubal ligation    . Cesarean section      x2    Arthur Holms, RD, LDN Pager #: [phone] After-Hours Pager #: [phone]

## 2014-02-22 NOTE — H&P (Addendum)
[facility] History and Physical  Rupal Childress ZOX:[id number] DOB: October 15, 1955 DOA: 02/21/2014  Referring physician: EDP PCP: Helane Rima, MD   Chief Complaint: Seizure   HPI: [name] [name] is a 58 y.o. female h/o strokes and seizures.  Patient has not had seizure since feb of this year.  She is on Keppra chronically.  Seizures normally consist of starring off in to space.  Tonight her seizure started out as the usual "being spaced out" but then progressed into jerking of her extremities.  After 2 of these episodes at home, EMS was called.  In transport patient had another seizure, was post-ictal on presentation here.  And was noted by EMS and in ED not to be moving her left side (normally moves left side with chronic weakness from prior stroke).  At baseline the patient is reported to be nonambulatory and incontinent, but is able to take care of other ADL's.  Review of Systems: Systems reviewed.  As above, otherwise negative  Past Medical History  Diagnosis Date  . Uterine fibroid   . Gastritis   . Hiatal hernia   . Hemorrhoids   . Anemia   . Stroke 12 1989  . Dyslipidemia   . Hypertension   . Seizure disorder   . Carpal tunnel syndrome   . Depression   . Osteoporosis   . Urinary incontinence   . Iron deficiency anemia 2008    negative EGD and colonoscopy  . Diabetes mellitus    Past Surgical History  Procedure Laterality Date  . Colonoscopy  2008  . Upper gastrointestinal endoscopy  2008  . Tracheostomy    . Breast surgery  1977  . Tubal ligation    . Cesarean section      x2   Social History:  reports that she has never smoked. She has never used smokeless tobacco. She reports that she does not drink alcohol or use illicit drugs.  Allergies  Allergen Reactions  . Amoxicillin Itching  . Cephalexin Itching  . Penicillins Itching    Family History  Problem Relation Age of Onset  . Heart disease Paternal Grandfather   . Hypertension Paternal Grandfather    . Prostate cancer Paternal Uncle   . Ovarian cancer Mother   . Diabetes Cousin   . Colon cancer Neg Hx   . Hypertension Maternal Grandmother   . Stroke Maternal Grandmother   . Diabetes Brother   . Heart attack Brother      Prior to Admission medications   Medication Sig Start Date End Date Taking? Authorizing Provider  aspirin 325 MG tablet Take 325 mg by mouth daily.   Yes Historical Provider, MD  atenolol (TENORMIN) 25 MG tablet Take 25 mg by mouth daily.    Yes Historical Provider, MD  atorvastatin (LIPITOR) 40 MG tablet Take 40 mg by mouth daily at 12 noon.    Yes Historical Provider, MD  iron polysaccharides (NIFEREX) 150 MG capsule Take 1 capsule (150 mg total) by mouth 2 (two) times daily. 07/25/13  Yes Ripudeep Krystal Eaton, MD  levETIRAcetam (KEPPRA) 1000 MG tablet Take 1,000 mg by mouth 2 (two) times daily.   Yes Historical Provider, MD  lisinopril (PRINIVIL,ZESTRIL) 10 MG tablet Take 5 mg by mouth daily.    Yes Historical Provider, MD   Physical Exam: Filed Vitals:   02/22/14 0145  BP: 151/76  Pulse: 65  Temp:   Resp: 13    BP 151/76  Pulse 65  Temp(Src) 97.4 F (36.3 C) (Oral)  Resp 13  SpO2 100%  LMP 06/24/2013  General Appearance:    Patient's allertness and orientation has improved at least somewhat by the time of my examination, she is now answering questions, is able to state her name and age and attempts to follow commands, no distress, appears stated age  Head:    Normocephalic, atraumatic  Eyes:    PERRL, EOMI, sclera non-icteric        Nose:   Nares without drainage or epistaxis. Mucosa, turbinates normal  Throat:   Moist mucous membranes. Oropharynx without erythema or exudate.  Neck:   Supple. No carotid bruits.  No thyromegaly.  No lymphadenopathy.   Back:     No CVA tenderness, no spinal tenderness  Lungs:     Clear to auscultation bilaterally, without wheezes, rhonchi or rales  Chest wall:    No tenderness to palpitation  Heart:    Regular rate and  rhythm without murmurs, gallops, rubs  Abdomen:     Soft, non-tender, nondistended, normal bowel sounds, no organomegaly  Genitalia:    deferred  Rectal:    deferred  Extremities:   No clubbing, cyanosis or edema.  Pulses:   2+ and symmetric all extremities  Skin:   Skin color, texture, turgor normal, no rashes or lesions  Lymph nodes:   Cervical, supraclavicular, and axillary nodes normal  Neurologic:   Improved since initial neurology exam, she now Follows commands with R side, attempts to do so with left side but is very weak.  Does coherently state her name and her age correctly.  Minimal movements BLE with increased tone.    Labs on Admission:  Basic Metabolic Panel:  Recent Labs Lab 02/21/14 2224  NA 142  K 3.6*  CL 101  CO2 25  GLUCOSE 115*  BUN 26*  CREATININE 0.72  CALCIUM 9.3   Liver Function Tests:  Recent Labs Lab 02/21/14 2224  AST 33  ALT 31  ALKPHOS 107  BILITOT 0.2*  PROT 7.5  ALBUMIN 3.4*   No results found for this basename: LIPASE, AMYLASE,  in the last 168 hours No results found for this basename: AMMONIA,  in the last 168 hours CBC:  Recent Labs Lab 02/21/14 2224  WBC 11.2*  NEUTROABS 7.4  HGB 11.8*  HCT 35.3*  MCV 94.4  PLT PLATELET CLUMPS NOTED ON SMEAR, COUNT APPEARS DECREASED   Cardiac Enzymes: No results found for this basename: CKTOTAL, CKMB, CKMBINDEX, TROPONINI,  in the last 168 hours  BNP (last 3 results) No results found for this basename: PROBNP,  in the last 8760 hours CBG:  Recent Labs Lab 02/21/14 2232 02/22/14 0153 02/22/14 0230  GLUCAP 115* 118* 120*    Radiological Exams on Admission: Ct Head Wo Contrast  02/22/2014   CLINICAL DATA:  Seizure, baseline left-sided paralysis from prior CVA. History of epilepsy.  EXAM: CT HEAD WITHOUT CONTRAST  TECHNIQUE: Contiguous axial images were obtained from the base of the skull through the vertex without intravenous contrast.  COMPARISON:  06/10/2013  FINDINGS: Similar  appearance of the bifrontal and high left parietal encephalomalacia. No CT evidence of an acute infarction. No intraparenchymal hemorrhage, mass, mass effect, or abnormal extra-axial fluid collection. No hydrocephalus. The visualized paranasal sinuses and mastoid air cells are predominantly clear.  IMPRESSION: Bifrontal and left parietal encephalomalacia, similar appearance to prior. No CT evidence of an acute intracranial abnormality.   Electronically Signed   By: [name] M.D.   On: 02/22/2014 01:14   Dg Chest  Portable 1 View  02/22/2014   CLINICAL DATA:  Altered mental status.  EXAM: PORTABLE CHEST - 1 VIEW  COMPARISON:  None currently available  FINDINGS: Normal heart size and mediastinal contours. No acute infiltrate or edema. No effusion or pneumothorax. Indeterminate ossification/mineralization along the proximal left humerus. No acute osseous findings.  IMPRESSION: No active disease.   Electronically Signed   By: Jorje Guild M.D.   On: 02/22/2014 02:50    EKG: Independently reviewed.  Assessment/Plan Principal Problem:   Seizure Active Problems:   HTN (hypertension)   History of stroke   Diabetes   HLD (hyperlipidemia)   1. Seizure - grand mal seizure, almost certainly related to extensive chronic underlying encephalomalacia (CT scan is very impressive for this).  In setting of known seizure disorder with prior seizures.  Patient now post-ictal but improving on my exam, answering questions, etc. 1. EEG in AM 2. MRI brain with and without contrast 3. Increase keppra to 1500mg  BID, got 1gm load in ED and getting 500mg  load now IV.  Will order tonights dose at 8pm PO (change to IV if patient not taking POs by that point but given her improvement thus far, suspect she will be taking POs at that time). 4. Seizure precautions 5. Tele monitor 6. Neuro consult 2. DM - listed in chart but do not see that patient on any meds for this, putting on carb mod diet and cbg checks AC/HS  for now. 3. HTN - continue home meds 4. HLD - continue statin    Code Status: Full Code  Family Communication: No family in room Disposition Plan: Admit to inpatient   Time spent: 70 min  Luceal Hollibaugh M. [facility] Pager [phone]  If 7AM-7PM, please contact the day team taking care of the patient [email or web address] Password [email or web address] 02/22/2014, 3:01 AM

## 2014-02-22 NOTE — Significant Event (Signed)
MR report called, there is what appears to be a very tiny, cant even see it on the CT scan, SDH.  Probably chronic according to radiologist.  Stopping heparin and using SCD's for DVT ppx, will leave her ASA alone at this point after discussion with neurology.

## 2014-02-22 NOTE — Progress Notes (Signed)
58 year old lady with h/o stroke and seizures comes in for seizure activity. Her EEG is abnormal. Her keppra was increased to 1500 BID.  She was post ictal this am. But this afternoon she is awake and conversational. Discussed with daughter at bedside.  Continue to monitor. patietn was seen and examined.   Hosie Poisson MD.  [phone]

## 2014-02-22 NOTE — ED Provider Notes (Signed)
I saw and evaluated the patient, reviewed the resident's note and I agree with the findings and plan.   EKG Interpretation   Date/Time:  Thursday February 22 2014 02:25:40 EDT Ventricular Rate:  55 PR Interval:  166 QRS Duration: 90 QT Interval:  456 QTC Calculation: 436 R Axis:   46 Text Interpretation:  Sinus rhythm Abnormal R-wave progression, early  transition Confirmed by HORTON  MD, COURTNEY ([id number]) on 02/22/2014 2:33:38  AM      CRITICAL CARE Performed by: Hoy Morn Total critical care time: 33 Critical care time was exclusive of separately billable procedures and treating other patients. Critical care was necessary to treat or prevent imminent or life-threatening deterioration. Critical care was time spent personally by me on the following activities: development of treatment plan with patient and/or surrogate as well as nursing, discussions with consultants, evaluation of patient's response to treatment, examination of patient, obtaining history from patient or surrogate, ordering and performing treatments and interventions, ordering and review of laboratory studies, ordering and review of radiographic studies, pulse oximetry and re-evaluation of patient's condition.  Seizure treated with Ativan on arrival.  The patient was monitored throughout her ER stay.  She never returned back to baseline mental status concern for status epilepticus with subclinical seizures.  Keppra loaded.  Neurology consultation.  Consideration for continuous EEG made.  Blood sugar rechecked as patient was still somnolent which was 118.  Head CT is negative.  Patient will be admitted to the hospital for ongoing workup of her altered mental status.  She does appear to be protecting her airway at this time.  Hoy Morn, MD 02/22/14 [phone]

## 2014-02-22 NOTE — Care Management Note (Signed)
    Page 1 of 1   02/23/2014     3:58:19 PM CARE MANAGEMENT NOTE 02/23/2014  Patient:  [name] [name]   Account Number:  [id number]  Date Initiated:  02/22/2014  Documentation initiated by:  Kenda Kloehn  Subjective/Objective Assessment:   PT adm on 10/28 with seizure.  PTA, pt resides at home with daughter.  Per report, pt is nonambulatory and incontinent.     Action/Plan:   Will follow for dc needs as pt progresses.  Would recommend PT/OT consults to [place] home needs.   Anticipated DC Date:  02/24/2014   Anticipated DC Plan:  [place] referral  Clinical Social Worker      DC Planning Services  CM consult      Choice offered to / List presented to:             Status of service:  Completed, signed off Medicare Important Message given?  NA - LOS <3 / Initial given by admissions (If response is "NO", the following Medicare IM given date fields will be blank) Date Medicare IM given:   Medicare IM given by:   Date Additional Medicare IM given:   Additional Medicare IM given by:    Discharge Disposition:  HOME/SELF CARE  Per UR Regulation:  Reviewed for med. necessity/level of care/duration of stay  If discussed at [place] of Stay Meetings, dates discussed:    Comments:  02/23/14 [name] Lambert, RN, BSN [phone] Met with pt and daughter to discuss dc plans.  Daughter and her BF take care of pt at home; states has hx of [facility] care, but none currently.  Pt has hoyer, Hosp bed, BSC and WC at home.  Denies any HH or DME needs for home.  Pt/daughter requesting transport home, as pt is bedbound; daughter has to go to work at noon.  Boyfriend at home to receive pt. CSW consulted to arrange transport with [facility] to home.

## 2014-02-22 NOTE — ED Notes (Signed)
Patient transported to MRI 

## 2014-02-22 NOTE — ED Notes (Signed)
Pt transported to CT ?

## 2014-02-22 NOTE — Progress Notes (Signed)
Patient not waking up for me this morning.  HR 40. BP 111/64. O@ 100% 2L. Paged Dr. Karleen Hampshire to notify. Patient was Alert last night according to night RN. She is snoring and Melanie, RN stated that she has OSA. Did not give patient any morning medications. Will continue to monitor closely. Glade Nurse, RN

## 2014-02-22 NOTE — Progress Notes (Signed)
Bedside EEG completed, results pending. 

## 2014-02-22 NOTE — Consult Note (Signed)
Reason for Consult:Seizure, altered mental status Referring Physician: Venora Maples  CC: Seizure  HPI: [name] [name] is an 58 y.o. female with a history of stroke and seizure.  Patient has not had a seizure since February of this year.  Is on Keppra.  Seizures usually consist of starring into space.  Tonight her seizure started as usual with her being "spaced out".  Instead of just starring off though, the patient had jerking of her extremities.  She had two of these seizures at home and EMS was called.  In transport the pateint had another seizure.  Was post-ictal on presentation here.  Was noted by EMS and here in ED to not be moving her left side.  This is not baseline for the patient.   At baseline the patient is nonambulatory and incontinent but able to take care of her other ADL's    Past Medical History  Diagnosis Date  . Uterine fibroid   . Gastritis   . Hiatal hernia   . Hemorrhoids   . Anemia   . Stroke 12 1989  . Dyslipidemia   . Hypertension   . Seizure disorder   . Carpal tunnel syndrome   . Depression   . Osteoporosis   . Urinary incontinence   . Iron deficiency anemia 2008    negative EGD and colonoscopy  . Diabetes mellitus     Past Surgical History  Procedure Laterality Date  . Colonoscopy  2008  . Upper gastrointestinal endoscopy  2008  . Tracheostomy    . Breast surgery  1977  . Tubal ligation    . Cesarean section      x2    Family History  Problem Relation Age of Onset  . Heart disease Paternal Grandfather   . Hypertension Paternal Grandfather   . Prostate cancer Paternal Uncle   . Ovarian cancer Mother   . Diabetes Cousin   . Colon cancer Neg Hx   . Hypertension Maternal Grandmother   . Stroke Maternal Grandmother   . Diabetes Brother   . Heart attack Brother     Social History:  reports that she has never smoked. She has never used smokeless tobacco. She reports that she does not drink alcohol or use illicit drugs.  Allergies  Allergen Reactions   . Amoxicillin Itching  . Cephalexin Itching  . Penicillins Itching    Medications: I have reviewed the patient's current medications. Prior to Admission:  Current outpatient prescriptions: aspirin 325 MG tablet, Take 325 mg by mouth daily., Disp: , Rfl: ;   atenolol (TENORMIN) 25 MG tablet, Take 25 mg by mouth daily. , Disp: , Rfl: ;   atorvastatin (LIPITOR) 40 MG tablet, Take 40 mg by mouth daily at 12 noon. , Disp: , Rfl: ;   iron polysaccharides (NIFEREX) 150 MG capsule, Take 1 capsule (150 mg total) by mouth 2 (two) times daily., Disp: 60 capsule, Rfl: 3 levETIRAcetam (KEPPRA) 1000 MG tablet, Take 1,000 mg by mouth 2 (two) times daily., Disp: , Rfl: ;   lisinopril (PRINIVIL,ZESTRIL) 10 MG tablet, Take 5 mg by mouth daily. , Disp: , Rfl:   ROS: Patient unable to provide secondary to mental status  Physical Examination: Blood pressure 151/76, pulse 65, temperature 97.4 F (36.3 C), temperature source Oral, resp. rate 13, last menstrual period 06/24/2013, SpO2 100.00%.  Neurologic Examination Mental Status: Patient does not respond to verbal stimuli.  Grimaces with deep sternal rub and attempts to localize to pain with RUE.  Does not follow  commands.  No verbalizations noted.  Cranial Nerves: II: patient does not respond confrontation bilaterally, pupils right 3 mm, left 3 mm,and reactive bilaterally III,IV,VI: doll's response absent bilaterally.  V,VII: corneal reflex present bilaterally  VIII: patient does not respond to verbal stimuli IX,X: gag reflex reduced, XI: trapezius strength unable to test bilaterally XII: tongue strength unable to test Motor: Spontaneous movement noted of RUE.  Only minimal movement noted on LUE.  Minimal movement in BLE's with increased tone.   Sensory: Does not respond to noxious stimuli in any extremity. Deep Tendon Reflexes:  2+ throughout. Plantars: upgoing on the right.  Mute on the left Cerebellar: Unable to perform    Laboratory  Studies:   Basic Metabolic Panel:  Recent Labs Lab 02/21/14 2224  NA 142  K 3.6*  CL 101  CO2 25  GLUCOSE 115*  BUN 26*  CREATININE 0.72  CALCIUM 9.3    Liver Function Tests:  Recent Labs Lab 02/21/14 2224  AST 33  ALT 31  ALKPHOS 107  BILITOT 0.2*  PROT 7.5  ALBUMIN 3.4*   No results found for this basename: LIPASE, AMYLASE,  in the last 168 hours No results found for this basename: AMMONIA,  in the last 168 hours  CBC:  Recent Labs Lab 02/21/14 2224  WBC 11.2*  NEUTROABS 7.4  HGB 11.8*  HCT 35.3*  MCV 94.4  PLT PLATELET CLUMPS NOTED ON SMEAR, COUNT APPEARS DECREASED    Cardiac Enzymes: No results found for this basename: CKTOTAL, CKMB, CKMBINDEX, TROPONINI,  in the last 168 hours  BNP: No components found with this basename: POCBNP,   CBG:  Recent Labs Lab 02/21/14 2232 02/22/14 0153  GLUCAP 115* 118*    Microbiology: Results for orders placed during the hospital encounter of 03/23/10  CULTURE, BLOOD (ROUTINE X 2)     Status: None   Collection Time    03/23/10 11:15 AM      Result Value Ref Range Status   Specimen Description BLOOD RIGHT ARM   Final   Special Requests     Final   Value: BOTTLES DRAWN AEROBIC AND ANAEROBIC 3CC IN BOTH BOTTLES   Culture  Setup Time [id number]   Final   Culture NO GROWTH 5 DAYS   Final   Report Status 03/29/2010 FINAL   Final  CULTURE, BLOOD (ROUTINE X 2)     Status: None   Collection Time    03/23/10 11:30 AM      Result Value Ref Range Status   Specimen Description BLOOD LEFT ARM   Final   Special Requests     Final   Value: BOTTLES DRAWN AEROBIC AND ANAEROBIC 5CC IN BOTH BOTTLES   Culture  Setup Time [id number]   Final   Culture NO GROWTH 5 DAYS   Final   Report Status 03/29/2010 FINAL   Final  MRSA PCR SCREENING     Status: None   Collection Time    03/23/10  3:09 PM      Result Value Ref Range Status   MRSA by PCR    NEGATIVE Final   Value: NEGATIVE            The GeneXpert MRSA Assay (FDA      approved for NASAL specimens     only), is one component of a     comprehensive MRSA colonization     surveillance program. It is not     intended to diagnose MRSA     infection nor  to guide or     monitor treatment for     MRSA infections.    Coagulation Studies: No results found for this basename: LABPROT, INR,  in the last 72 hours  Urinalysis: No results found for this basename: COLORURINE, APPERANCEUR, LABSPEC, PHURINE, GLUCOSEU, HGBUR, BILIRUBINUR, KETONESUR, PROTEINUR, UROBILINOGEN, NITRITE, LEUKOCYTESUR,  in the last 168 hours  Lipid Panel:     Component Value Date/Time   CHOL  Value: 115        ATP III CLASSIFICATION:  <200     mg/dL   Desirable  200-239  mg/dL   Borderline High  >=240    mg/dL   High        03/24/2010 0519   TRIG 65 03/24/2010 0519   HDL 43 03/24/2010 0519   CHOLHDL 2.7 03/24/2010 0519   VLDL 13 03/24/2010 0519   LDLCALC  Value: 59        Total Cholesterol/HDL:CHD Risk Coronary Heart Disease Risk Table                     Men   Women  1/2 Average Risk   3.4   3.3  Average Risk       5.0   4.4  2 X Average Risk   9.6   7.1  3 X Average Risk  23.4   11.0        Use the calculated Patient Ratio above and the CHD Risk Table to determine the patient's CHD Risk.        ATP III CLASSIFICATION (LDL):  <100     mg/dL   Optimal  100-129  mg/dL   Near or Above                    Optimal  130-159  mg/dL   Borderline  160-189  mg/dL   High  >190     mg/dL   Very High 03/24/2010 0519    HgbA1C:  No results found for this basename: HGBA1C    Urine Drug Screen:   No results found for this basename: labopia, cocainscrnur, labbenz, amphetmu, thcu, labbarb    Alcohol Level: No results found for this basename: ETH,  in the last 168 hours  Other results: EKG: sinus rhythm at 55 bpm.  Imaging: Ct Head Wo Contrast  02/22/2014   CLINICAL DATA:  Seizure, baseline left-sided paralysis from prior CVA. History of epilepsy.  EXAM: CT HEAD WITHOUT CONTRAST  TECHNIQUE:  Contiguous axial images were obtained from the base of the skull through the vertex without intravenous contrast.  COMPARISON:  06/10/2013  FINDINGS: Similar appearance of the bifrontal and high left parietal encephalomalacia. No CT evidence of an acute infarction. No intraparenchymal hemorrhage, mass, mass effect, or abnormal extra-axial fluid collection. No hydrocephalus. The visualized paranasal sinuses and mastoid air cells are predominantly clear.  IMPRESSION: Bifrontal and left parietal encephalomalacia, similar appearance to prior. No CT evidence of an acute intracranial abnormality.   Electronically Signed   By: [name] M.D.   On: 02/22/2014 01:14     Assessment/Plan: 58 year old female with a history of stroke and seizure.  Patient presents tonight with breakthrough seizures of atypical presentation and some associated left sided weakness.  Patient received 1mg  of Ativan and despite hours patient remains more unresponsive than on initial presentation.  Unclear if patient may be in subclinical status epilepticus. Head CT reviewed and shows evidence of her chronic infarcts but no acute events  were noted.  Further work up recommended.  Recommendations: 1.  EEG in AM 2.  Continue Keppra and increase dose to 1500mg  BID 3.  MRI of the brain with and without contrast  Alexis Goodell, MD [facility] [phone] 02/22/2014, 2:17 AM

## 2014-02-22 NOTE — Progress Notes (Addendum)
Subjective: No overnight events. Remains lethargic, difficult to arouse. No further seizure activity. MRI shows signs of small SDH, likely chronic, not visualized on head CT.  History: [name] [name] is an 58 y.o. female with a history of stroke and seizure. Patient has not had a seizure since February of this year. Is on Keppra. Seizures usually consist of starring into space. Tonight her seizure started as usual with her being "spaced out". Instead of just starring off though, the patient had jerking of her extremities. She had two of these seizures at home and EMS was called. In transport the pateint had another seizure. Was post-ictal on presentation here. Was noted by EMS and here in ED to not be moving her left side. This is not baseline for the patient.  At baseline the patient is nonambulatory and incontinent but able to take care of her other ADL's   Objective: Current vital signs: BP 136/76  Pulse 47  Temp(Src) 97.4 F (36.3 C) (Oral)  Resp 16  Wt 47.356 kg (104 lb 6.4 oz)  SpO2 100%  LMP 06/24/2013 Vital signs in last 24 hours: Temp:  [97.4 F (36.3 C)] 97.4 F (36.3 C) (10/29 0529) Pulse Rate:  [47-110] 47 (10/29 0529) Resp:  [11-19] 16 (10/29 0529) BP: (109-166)/(64-96) 136/76 mmHg (10/29 0529) SpO2:  [95 %-100 %] 100 % (10/29 0529) Weight:  [47.356 kg (104 lb 6.4 oz)] 47.356 kg (104 lb 6.4 oz) (10/29 0529)  Intake/Output from previous day: 10/28 0701 - 10/29 0700 In: 100 [IV Piggyback:100] Out: -  Intake/Output this shift:   Nutritional status:    Neurologic Exam: Mental status Patient does not respond to verbal stimuli. Grimaces with deep sternal rub and attempts to localize to pain with RUE. Does not follow commands. Moans to noxious stimuli but no other verbal output Cranial Nerves:  II: patient does not respond confrontation bilaterally, pupils right 3 mm, left 3 mm,and reactive bilaterally  V,VII: corneal reflex present bilaterally  VIII: patient does not  respond to verbal stimuli  Motor:  Spontaneous movement noted of RUE. Only minimal movement noted on LUE. Minimal movement in BLE's with increased tone.  Sensory:  Does not respond to noxious stimuli in any extremity.  Deep Tendon Reflexes:  2+ throughout.  Plantars:  upgoing on the right. Mute on the left  Cerebellar:  Unable to perform   Lab Results: Basic Metabolic Panel:  Recent Labs Lab 02/21/14 2224  NA 142  K 3.6*  CL 101  CO2 25  GLUCOSE 115*  BUN 26*  CREATININE 0.72  CALCIUM 9.3    Liver Function Tests:  Recent Labs Lab 02/21/14 2224  AST 33  ALT 31  ALKPHOS 107  BILITOT 0.2*  PROT 7.5  ALBUMIN 3.4*   No results found for this basename: LIPASE, AMYLASE,  in the last 168 hours No results found for this basename: AMMONIA,  in the last 168 hours  CBC:  Recent Labs Lab 02/21/14 2224  WBC 11.2*  NEUTROABS 7.4  HGB 11.8*  HCT 35.3*  MCV 94.4  PLT PLATELET CLUMPS NOTED ON SMEAR, COUNT APPEARS DECREASED    Cardiac Enzymes: No results found for this basename: CKTOTAL, CKMB, CKMBINDEX, TROPONINI,  in the last 168 hours  Lipid Panel: No results found for this basename: CHOL, TRIG, HDL, CHOLHDL, VLDL, LDLCALC,  in the last 168 hours  CBG:  Recent Labs Lab 02/21/14 2232 02/22/14 0153 02/22/14 0230  GLUCAP 115* 118* 120*    Microbiology: Results for orders placed  during the hospital encounter of 03/23/10  CULTURE, BLOOD (ROUTINE X 2)     Status: None   Collection Time    03/23/10 11:15 AM      Result Value Ref Range Status   Specimen Description BLOOD RIGHT ARM   Final   Special Requests     Final   Value: BOTTLES DRAWN AEROBIC AND ANAEROBIC 3CC IN BOTH BOTTLES   Culture  Setup Time [id number]   Final   Culture NO GROWTH 5 DAYS   Final   Report Status 03/29/2010 FINAL   Final  CULTURE, BLOOD (ROUTINE X 2)     Status: None   Collection Time    03/23/10 11:30 AM      Result Value Ref Range Status   Specimen Description BLOOD LEFT  ARM   Final   Special Requests     Final   Value: BOTTLES DRAWN AEROBIC AND ANAEROBIC 5CC IN BOTH BOTTLES   Culture  Setup Time [id number]   Final   Culture NO GROWTH 5 DAYS   Final   Report Status 03/29/2010 FINAL   Final  MRSA PCR SCREENING     Status: None   Collection Time    03/23/10  3:09 PM      Result Value Ref Range Status   MRSA by PCR    NEGATIVE Final   Value: NEGATIVE            The GeneXpert MRSA Assay (FDA     approved for NASAL specimens     only), is one component of a     comprehensive MRSA colonization     surveillance program. It is not     intended to diagnose MRSA     infection nor to guide or     monitor treatment for     MRSA infections.    Coagulation Studies: No results found for this basename: LABPROT, INR,  in the last 72 hours  Imaging: Ct Head Wo Contrast  02/22/2014   CLINICAL DATA:  Seizure, baseline left-sided paralysis from prior CVA. History of epilepsy.  EXAM: CT HEAD WITHOUT CONTRAST  TECHNIQUE: Contiguous axial images were obtained from the base of the skull through the vertex without intravenous contrast.  COMPARISON:  06/10/2013  FINDINGS: Similar appearance of the bifrontal and high left parietal encephalomalacia. No CT evidence of an acute infarction. No intraparenchymal hemorrhage, mass, mass effect, or abnormal extra-axial fluid collection. No hydrocephalus. The visualized paranasal sinuses and mastoid air cells are predominantly clear.  IMPRESSION: Bifrontal and left parietal encephalomalacia, similar appearance to prior. No CT evidence of an acute intracranial abnormality.   Electronically Signed   By: [name] M.D.   On: 02/22/2014 01:14   Mr [name] KK Contrast  02/22/2014   CLINICAL DATA:  Initial evaluation for acute seizure. History of stroke.  EXAM: MRI HEAD WITHOUT AND WITH CONTRAST  TECHNIQUE: Multiplanar, multiecho pulse sequences of the brain and surrounding structures were obtained without and with intravenous  contrast.  CONTRAST:  10 cc of MultiHance.  COMPARISON:  Prior CT from earlier the same day.  FINDINGS: Study is degraded by motion artifact.  Extensive bifrontal encephalomalacia again seen, stable from prior. Finding is likely related to remote infarcts. There is mild gliosis peripherally. Small amount of probable chronic blood products seen within the left parietal lobe on gradient echo sequence.  No abnormal foci of restricted diffusion to suggest acute intracranial infarct identified. Gray-white matter differentiation is otherwise maintained. No intracranial hemorrhage.  Normal intravascular flow voids are seen.  No mass lesion or midline shift. Ventricles are within normal limits without evidence of hydrocephalus. No abnormal enhancement within the brain parenchyma.  There is a small irregular extra-axial fluid collection overlying the right frontal and temporal lobes. This measures up to 2 mm at the level of the right frontal lobe and 5 mm at the level of the low right temporal lobe. This collection demonstrate associated restricted diffusion without significant enhancement. No reactive changes seen within the subjacent brain parenchyma. 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  Craniocervical junction grossly normal. Pituitary gland not well evaluated on this exam. No acute abnormality seen about the orbits.  Paranasal sinuses are clear.  No mastoid effusion.  Signal intensity within the visualized bone marrow is grossly normal. Scalp soft tissues unremarkable.  IMPRESSION: 1. No acute intracranial infarct identified. 2. 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 No significant mass effect. 3. 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 Critical Value/emergent results were called by telephone at the time of interpretation on 02/22/2014 at approximately 5:45 a.m. to Dr. Jennette Kettle , who verbally  acknowledged these results.   Electronically Signed   By: Jeannine Boga M.D.   On: 02/22/2014 05:59   Dg Chest Portable 1 View  02/22/2014   CLINICAL DATA:  Altered mental status.  EXAM: PORTABLE CHEST - 1 VIEW  COMPARISON:  None currently available  FINDINGS: Normal heart size and mediastinal contours. No acute infiltrate or edema. No effusion or pneumothorax. Indeterminate ossification/mineralization along the proximal left humerus. No acute osseous findings.  IMPRESSION: No active disease.   Electronically Signed   By: Jorje Guild M.D.   On: 02/22/2014 02:50    Medications:  Scheduled: . aspirin  325 mg Oral Daily  . atenolol  25 mg Oral Daily  . atorvastatin  40 mg Oral Q1200  . gadobenate dimeglumine  10 mL Intravenous Once  . iron polysaccharides  150 mg Oral BID  . levETIRAcetam  1,500 mg Oral BID  . lisinopril  5 mg Oral Daily    Assessment/Plan:  58 year old female with a history of stroke and seizure. Patient presents tonight with breakthrough seizures of atypical presentation and some associated left sided weakness. Patient received 1mg  of Ativan last night and still remains unresponsive this morning. Have concern for subclinical status epilepticus. MRI shows very small, likely chronic SDH but otherwise unremarkable. Recommendations:   1. EEG in AM  2. Continue Keppra and increase dose to 1500mg  BID  3. Based on small size of SDH can continue daily ASA 4. Unclear etiology of profound encephalopathy. Will check ammonia, TSH, B12. Will consider LP if EEG unremarkable and mental status not improving.    LOS: 1 day   Jim Like, DO [facility] [phone]  If 7pm- 7am, please page neurology on call as listed in [place]. 02/22/2014  8:07 AM

## 2014-02-22 NOTE — Progress Notes (Addendum)
ANTIBIOTIC CONSULT NOTE - INITIAL  Pharmacy Consult for ceftriaxone Indication: UTI  Allergies  Allergen Reactions  . Amoxicillin Itching  . Cephalexin Itching  . Penicillins Itching    Patient Measurements: Height: 5\' 7"  (170.2 cm) Weight: 104 lb 6.4 oz (47.356 kg) IBW/kg (Calculated) : 61.6   Vital Signs: BP: 111/64 mmHg (10/29 1025) Pulse Rate: 42 (10/29 1025) Intake/Output from previous day: 10/28 0701 - 10/29 0700 In: 100 [IV Piggyback:100] Out: -  Intake/Output from this shift:    Labs:  Recent Labs  02/21/14 2224  WBC 11.2*  HGB 11.8*  PLT PLATELET CLUMPS NOTED ON SMEAR, COUNT APPEARS DECREASED  CREATININE 0.72   Estimated Creatinine Clearance: 58.8 ml/min (by C-G formula based on Cr of 0.72). No results found for this basename: VANCOTROUGH, VANCOPEAK, VANCORANDOM, GENTTROUGH, GENTPEAK, GENTRANDOM, TOBRATROUGH, TOBRAPEAK, TOBRARND, AMIKACINPEAK, AMIKACINTROU, AMIKACIN,  in the last 72 hours   Microbiology: No results found for this or any previous visit (from the past 720 hour(s)).  Medical History: Past Medical History  Diagnosis Date  . Uterine fibroid   . Gastritis   . Hiatal hernia   . Hemorrhoids   . Anemia   . Stroke 12 1989  . Dyslipidemia   . Hypertension   . Seizure disorder   . Carpal tunnel syndrome   . Depression   . Osteoporosis   . Urinary incontinence   . Iron deficiency anemia 2008    negative EGD and colonoscopy  . Diabetes mellitus   . Seizures     Assessment: 64 YOF admitted with seizure activity. UA cloudy with positive nitrites. To start ceftriaxone for UTI. WBC 11.2, afebrile SCr 0.72 with est CrCl ~66mL/min  Goal of Therapy:  eradication of infection  Plan:  1. Ceftriaxone 1g IV q24h 2. Recommend switching to PO therapy when able and likely a 7 day course will be adequate unless a more complicated infection is suspected 3. Pharmacy to sign off as no further dose adjustment needed 4. Noted patient has had  itching in the past with other penicillins and cephalosporins- can trial diphenhydramine or switch patient to ciprofloxacin.  Ceylon Arenson D. Emmalyne Giacomo, PharmD, BCPS Clinical Pharmacist Pager: [phone] 02/22/2014 6:13 PM

## 2014-02-22 NOTE — Procedures (Signed)
History: 58 yo F with seizure  Sedation: None, ativan given prior in ED  Technique: This is a 17 channel routine scalp EEG performed at the bedside with bipolar and monopolar montages arranged in accordance to the international 10/20 system of electrode placement. One channel was dedicated to EKG recording.    Background: The majority of this recording is during sleep. There are sleep structures including k-complexes and spindles which are seen better on the left than right. During periods of wakefulness, there is a posterior rhythm of 8 Hz seen at times There is also irregular delta activity that is much more prominant on the right. There are left frontotemporal sharp waves seen at times(F7,Fp1 > T7, F3).   Photic stimulation: Physiologic driving is not performed  EEG Abnormalities: 1) Left Frontotemporal sharp waves  2) asymmetric sleep structures 3) Irregular Right > left delta activity.   Clinical Interpretation: This EEG recorded evidence of a zone of potential epileptogenicity in teh left frontotemporal region. There is also evidence of right hemispheric dysfunction. There was no seizure recorded on this study.   Roland Rack, MD [facility] [phone]  If 7pm- 7am, please page neurology on call as listed in [place].

## 2014-02-23 DIAGNOSIS — I10 Essential (primary) hypertension: Secondary | ICD-10-CM

## 2014-02-23 DIAGNOSIS — R404 Transient alteration of awareness: Secondary | ICD-10-CM

## 2014-02-23 DIAGNOSIS — E43 Unspecified severe protein-calorie malnutrition: Secondary | ICD-10-CM | POA: Insufficient documentation

## 2014-02-23 LAB — GLUCOSE, CAPILLARY
Glucose-Capillary: 97 mg/dL (ref 70–99)
Glucose-Capillary: 125 mg/dL — ABNORMAL HIGH (ref 70–99)

## 2014-02-23 LAB — BASIC METABOLIC PANEL
Anion gap: 9 (ref 5–15)
BUN: 18 mg/dL (ref 6–23)
CALCIUM: 8.8 mg/dL (ref 8.4–10.5)
CO2: 31 meq/L (ref 19–32)
Chloride: 103 mEq/L (ref 96–112)
Creatinine, Ser: 0.54 mg/dL (ref 0.50–1.10)
GFR calc Af Amer: >90 mL/min (ref >90)
GLUCOSE: 98 mg/dL (ref 70–99)
POTASSIUM: 3.2 meq/L — AB (ref 3.7–5.3)
SODIUM: 143 meq/L (ref 137–147)

## 2014-02-23 LAB — CBC
HEMATOCRIT: 31.9 % — AB (ref 36.0–46.0)
Hemoglobin: 10.8 g/dL — ABNORMAL LOW (ref 12.0–15.0)
MCH: 31.8 pg (ref 26.0–34.0)
MCHC: 33.9 g/dL (ref 30.0–36.0)
MCV: 93.8 fL (ref 78.0–100.0)
PLATELETS: DECREASED 10*3/uL (ref 150–400)
RBC: 3.4 MIL/uL — AB (ref 3.87–5.11)
RDW: 13.4 % (ref 11.5–15.5)
WBC: 5.2 10*3/uL (ref 4.0–10.5)

## 2014-02-23 LAB — VITAMIN B12: VITAMIN B 12: 739 pg/mL (ref 211–911)

## 2014-02-23 MED ORDER — LEVOFLOXACIN 500 MG PO TABS: 500.0000 mg | ORAL_TABLET | Freq: Every day | ORAL | Status: DC

## 2014-02-23 MED ORDER — ENSURE COMPLETE PO LIQD: 237.0000 mL | Freq: Two times a day (BID) | ORAL | Status: DC

## 2014-02-23 MED ORDER — POTASSIUM CHLORIDE CRYS ER 20 MEQ PO TBCR
40.0000 meq | EXTENDED_RELEASE_TABLET | Freq: Once | ORAL | Status: AC
  Administered 2014-02-23: 40 meq via ORAL
  Filled 2014-02-23: qty 2

## 2014-02-23 MED ORDER — ADULT MULTIVITAMIN W/MINERALS CH
1.0000 | ORAL_TABLET | Freq: Every day | ORAL | Status: DC
  Administered 2014-02-23: 1 via ORAL
  Filled 2014-02-23: qty 1

## 2014-02-23 MED ORDER — LEVETIRACETAM 750 MG PO TABS: 1500.0000 mg | ORAL_TABLET | Freq: Two times a day (BID) | ORAL | Status: DC

## 2014-02-23 MED ORDER — ADULT MULTIVITAMIN W/MINERALS CH: 1.0000 | ORAL_TABLET | Freq: Every day | ORAL | Status: DC

## 2014-02-23 NOTE — Progress Notes (Signed)
NUTRITION FOLLOW UP  Intervention:   Continue Ensure Complete BID Encourage PO intake Provided and reviewed "Suggestions for Increasing Caloris and Protein" and "High Protein Foods List" from the Academy of Nutrition and Dietetics Provide Multivitamin with minerals daily Recommend providing additional 500 mg of Vitamin C daily  Nutrition Dx:   Inadequate oral intake related to lethargy as evidenced by PO intake 0%; ongoing/improving  Goal:   Pt to meet >/= 90% of their estimated nutrition needs   Monitor:   PO intake, weight, labs, I/O's  Assessment:   58 y.o. Female h/o strokes and seizures. Patient has not had seizure since Feb of this year. She is on Keppra chronically. Seizures normally consist of starring off into space. Tonight her seizure started out as the usual "being spaced out" but then progressed into jerking of her extremities. After 2 of these episodes at home, EMS was called. In transport patient had another seizure, was post-ictal on presentation here.  Pt reports that she used to weigh 160 lbs over one year ago and she has gradually been losing weight. She states she just gave up and has no desire to eat. Daughter at bedside reports that she prepares food for patient and she usually eats it. Pt reports being wheelchair bound for 30 years. She ate about 75% of her breakfast this morning: cereal, bagel, and some grits and juice. RD encouraged intake of protein rich foods at each meal. Discussed tips for increasing protein and calories in diet. Daughter states she has been giving pt Ensure protein shakes 1-2 times daily. Pt reports taking a daily iron supplement PTA. RD recommended daily Multivitamin with minerals and daily Vitamin C supplement.   Labs: low potassium, Glucose ranging 75 to 170 mg/dL, low hemoglobin  Nutrition Focused Physical Exam:  Subcutaneous Fat:  Orbital Region: mild wasting Upper Arm Region: mild wasting Thoracic and Lumbar Region: NA  Muscle:   Temple Region: wnl Clavicle Bone Region: moderate wasting Clavicle and Acromion Bone Region: moderate wasting Scapular Bone Region: NA Dorsal Hand: mild wasting Patellar Region: severe wasting Anterior Thigh Region: severe wasting Posterior Calf Region: severe wasting  Edema: none noted  Pt meets criteria for SEVERE MALNUTRITION in the context of CHRONIC ILLNESS as evidenced by 17% weight loss in less than 4 months, estimated energy intake < 75% of estimated energy needs for > 1 month, and severe muscle wasting per physical exam.   Height: Ht Readings from Last 1 Encounters:  02/22/14 5\' 7"  (1.702 m)     Weight Status:   Wt Readings from Last 1 Encounters:  02/22/14 104 lb 6.4 oz (47.356 kg)  11/02/13 125 lb  Re-estimated needs:  Kcal: 1500-1700  Protein: 70-80 gm  Fluid: 1.5-1.7 L  Skin: non-pitting RLE and LLE edema per nursing notes; unstageable pressure ulcer on buttocks  Diet Order:     Intake/Output Summary (Last 24 hours) at 02/23/14 1000 Last data filed at 02/22/14 2250  Gross per 24 hour  Intake    240 ml  Output      0 ml  Net    240 ml    Last BM: 10/29   Labs:   Recent Labs Lab 02/21/14 2224 02/23/14 0300  NA 142 143  K 3.6* 3.2*  CL 101 103  CO2 25 31  BUN 26* 18  CREATININE 0.72 0.54  CALCIUM 9.3 8.8  GLUCOSE 115* 98    CBG (last 3)   Recent Labs  02/22/14 1622 02/22/14 2220 02/23/14 2376  GLUCAP 170* 75 97    Scheduled Meds: . aspirin  325 mg Oral Daily  . atenolol  25 mg Oral Daily  . atorvastatin  40 mg Oral Q1200  . cefTRIAXone (ROCEPHIN)  IV  1 g Intravenous Q24H  . feeding supplement (ENSURE COMPLETE)  237 mL Oral BID BM  . gadobenate dimeglumine  10 mL Intravenous Once  . iron polysaccharides  150 mg Oral BID  . levETIRAcetam  1,500 mg Oral BID  . lisinopril  5 mg Oral Daily    Continuous Infusions:   Pryor Ochoa RD, LDN Inpatient Clinical Dietitian Pager: [phone] After Hours Pager: [phone]

## 2014-02-23 NOTE — Clinical Social Work Note (Signed)
CSW received referral for ambulance transportation for patient to go back home.  EMS contacted and will transport patient back to her home, med necessity form completed.  CSW to sign off please re-consult if social work needs arise.  Jones Broom. [place], MSW, [place]

## 2014-02-23 NOTE — Progress Notes (Signed)
Subjective: No overnight events. EEG completed shows left frontotemporal sharp waves and R>L delta activity.   History: [name] [name] is an 58 y.o. female with a history of stroke and seizure. Patient has not had a seizure since February of this year. Is on Keppra. Seizures usually consist of starring into space. Tonight her seizure started as usual with her being "spaced out". Instead of just starring off though, the patient had jerking of her extremities. She had two of these seizures at home and EMS was called. In transport the pateint had another seizure. Was post-ictal on presentation here. Was noted by EMS and here in ED to not be moving her left side. This is not baseline for the patient.  At baseline the patient is nonambulatory and incontinent but able to take care of her other ADL's   Objective: Current vital signs: BP 132/78  Pulse 49  Temp(Src) 97.4 F (36.3 C) (Oral)  Resp 16  Ht 5\' 7"  (1.702 m)  Wt 47.356 kg (104 lb 6.4 oz)  BMI 16.35 kg/m2  SpO2 100%  LMP 06/24/2013 Vital signs in last 24 hours: Temp:  [97.4 F (36.3 C)] 97.4 F (36.3 C) (10/30 0420) Pulse Rate:  [42-49] 49 (10/30 0420) Resp:  [16] 16 (10/30 0420) BP: (111-134)/(64-78) 132/78 mmHg (10/30 0420) SpO2:  [100 %] 100 % (10/30 0420)  Intake/Output from previous day: 10/29 0701 - 10/30 0700 In: 240 [P.O.:240] Out: -  Intake/Output this shift:   Nutritional status:    Neurologic Exam: Mental status Alert, sitting up in bed,oriented x 3, follows simple and mult step commands Cranial Nerves:  II: patient does not respond confrontation bilaterally, pupils right 3 mm, left 3 mm,and reactive bilaterally  V,VII: corneal reflex present bilaterally  VIII: patient does not respond to verbal stimuli  Motor:  Spontaneous movement noted of all extremities, appears to move R>L but good movement overall Sensory:  LT intact in all extremities Deep Tendon Reflexes:  2+ throughout.  Plantars:  upgoing on the  right. Mute on the left  Cerebellar:  Unable to perform   Lab Results: Basic Metabolic Panel:  Recent Labs Lab 02/21/14 2224 02/23/14 0300  NA 142 143  K 3.6* 3.2*  CL 101 103  CO2 25 31  GLUCOSE 115* 98  BUN 26* 18  CREATININE 0.72 0.54  CALCIUM 9.3 8.8    Liver Function Tests:  Recent Labs Lab 02/21/14 2224  AST 33  ALT 31  ALKPHOS 107  BILITOT 0.2*  PROT 7.5  ALBUMIN 3.4*   No results found for this basename: LIPASE, AMYLASE,  in the last 168 hours  Recent Labs Lab 02/22/14 1105  AMMONIA 65*    CBC:  Recent Labs Lab 02/21/14 2224 02/23/14 0300  WBC 11.2* 5.2  NEUTROABS 7.4  --   HGB 11.8* 10.8*  HCT 35.3* 31.9*  MCV 94.4 93.8  PLT PLATELET CLUMPS NOTED ON SMEAR, COUNT APPEARS DECREASED PLATELET CLUMPS NOTED ON SMEAR, COUNT APPEARS DECREASED    Cardiac Enzymes: No results found for this basename: CKTOTAL, CKMB, CKMBINDEX, TROPONINI,  in the last 168 hours  Lipid Panel: No results found for this basename: CHOL, TRIG, HDL, CHOLHDL, VLDL, LDLCALC,  in the last 168 hours  CBG:  Recent Labs Lab 02/22/14 1016 02/22/14 1206 02/22/14 1622 02/22/14 2220 02/23/14 0640  GLUCAP 80 82 170* 75 97    Microbiology: Results for orders placed during the hospital encounter of 03/23/10  CULTURE, BLOOD (ROUTINE X 2)     Status:  None   Collection Time    03/23/10 11:15 AM      Result Value Ref Range Status   Specimen Description BLOOD RIGHT ARM   Final   Special Requests     Final   Value: BOTTLES DRAWN AEROBIC AND ANAEROBIC 3CC IN BOTH BOTTLES   Culture  Setup Time [id number]   Final   Culture NO GROWTH 5 DAYS   Final   Report Status 03/29/2010 FINAL   Final  CULTURE, BLOOD (ROUTINE X 2)     Status: None   Collection Time    03/23/10 11:30 AM      Result Value Ref Range Status   Specimen Description BLOOD LEFT ARM   Final   Special Requests     Final   Value: BOTTLES DRAWN AEROBIC AND ANAEROBIC 5CC IN BOTH BOTTLES   Culture  Setup Time  [id number]   Final   Culture NO GROWTH 5 DAYS   Final   Report Status 03/29/2010 FINAL   Final  MRSA PCR SCREENING     Status: None   Collection Time    03/23/10  3:09 PM      Result Value Ref Range Status   MRSA by PCR    NEGATIVE Final   Value: NEGATIVE            The GeneXpert MRSA Assay (FDA     approved for NASAL specimens     only), is one component of a     comprehensive MRSA colonization     surveillance program. It is not     intended to diagnose MRSA     infection nor to guide or     monitor treatment for     MRSA infections.    Coagulation Studies: No results found for this basename: LABPROT, INR,  in the last 72 hours  Imaging: Ct Head Wo Contrast  02/22/2014   CLINICAL DATA:  Seizure, baseline left-sided paralysis from prior CVA. History of epilepsy.  EXAM: CT HEAD WITHOUT CONTRAST  TECHNIQUE: Contiguous axial images were obtained from the base of the skull through the vertex without intravenous contrast.  COMPARISON:  06/10/2013  FINDINGS: Similar appearance of the bifrontal and high left parietal encephalomalacia. No CT evidence of an acute infarction. No intraparenchymal hemorrhage, mass, mass effect, or abnormal extra-axial fluid collection. No hydrocephalus. The visualized paranasal sinuses and mastoid air cells are predominantly clear.  IMPRESSION: Bifrontal and left parietal encephalomalacia, similar appearance to prior. No CT evidence of an acute intracranial abnormality.   Electronically Signed   By: [name] M.D.   On: 02/22/2014 01:14   Mr [name] JO Contrast  02/22/2014   CLINICAL DATA:  Initial evaluation for acute seizure. History of stroke.  EXAM: MRI HEAD WITHOUT AND WITH CONTRAST  TECHNIQUE: Multiplanar, multiecho pulse sequences of the brain and surrounding structures were obtained without and with intravenous contrast.  CONTRAST:  10 cc of MultiHance.  COMPARISON:  Prior CT from earlier the same day.  FINDINGS: Study is degraded by motion  artifact.  Extensive bifrontal encephalomalacia again seen, stable from prior. Finding is likely related to remote infarcts. There is mild gliosis peripherally. Small amount of probable chronic blood products seen within the left parietal lobe on gradient echo sequence.  No abnormal foci of restricted diffusion to suggest acute intracranial infarct identified. Gray-white matter differentiation is otherwise maintained. No intracranial hemorrhage. Normal intravascular flow voids are seen.  No mass lesion or midline shift. Ventricles are within normal  limits without evidence of hydrocephalus. No abnormal enhancement within the brain parenchyma.  There is a small irregular extra-axial fluid collection overlying the right frontal and temporal lobes. This measures up to 2 mm at the level of the right frontal lobe and 5 mm at the level of the low right temporal lobe. This collection demonstrate associated restricted diffusion without significant enhancement. No reactive changes seen within the subjacent brain parenchyma. 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  Craniocervical junction grossly normal. Pituitary gland not well evaluated on this exam. No acute abnormality seen about the orbits.  Paranasal sinuses are clear.  No mastoid effusion.  Signal intensity within the visualized bone marrow is grossly normal. Scalp soft tissues unremarkable.  IMPRESSION: 1. No acute intracranial infarct identified. 2. 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 No significant mass effect. 3. 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 Critical Value/emergent results were called by telephone at the time of interpretation on 02/22/2014 at approximately 5:45 a.m. to Dr. Jennette Kettle , who verbally acknowledged these results.   Electronically Signed   By: Jeannine Boga M.D.   On: 02/22/2014 05:59   Dg Chest Portable 1  View  02/22/2014   CLINICAL DATA:  Altered mental status.  EXAM: PORTABLE CHEST - 1 VIEW  COMPARISON:  None currently available  FINDINGS: Normal heart size and mediastinal contours. No acute infiltrate or edema. No effusion or pneumothorax. Indeterminate ossification/mineralization along the proximal left humerus. No acute osseous findings.  IMPRESSION: No active disease.   Electronically Signed   By: Jorje Guild M.D.   On: 02/22/2014 02:50    Medications:  Scheduled: . aspirin  325 mg Oral Daily  . atenolol  25 mg Oral Daily  . atorvastatin  40 mg Oral Q1200  . cefTRIAXone (ROCEPHIN)  IV  1 g Intravenous Q24H  . feeding supplement (ENSURE COMPLETE)  237 mL Oral BID BM  . gadobenate dimeglumine  10 mL Intravenous Once  . iron polysaccharides  150 mg Oral BID  . levETIRAcetam  1,500 mg Oral BID  . lisinopril  5 mg Oral Daily    Assessment/Plan:  58 year old female with a history of stroke and seizure. Patient presents tonight with breakthrough seizures of atypical presentation and some associated left sided weakness. Mental status markedly improved.   1. Mental status appears back to baseline. No further inpatient neurological workup indicated at this time 2. Continue Keppra and increase dose to 1500mg  BID  3. Based on small size of SDH can continue daily ASA 4. Please follow up with outpatient neurology upon discharge    LOS: 2 days   Jim Like, DO [facility] [phone]  If 7pm- 7am, please page neurology on call as listed in [place]. 02/23/2014  7:48 AM

## 2014-02-23 NOTE — Discharge Summary (Signed)
Physician Discharge Summary  [name] [name] ZOX:[id number] DOB: 02-27-1956 DOA: 02/21/2014  PCP: [name] Rima, MD  Admit date: 02/21/2014 Discharge date: 02/23/2014  Time spent: 30 minutes  Recommendations for Outpatient Follow-up:  Follow up with neurology as recommended.  Follow up with PCP regarding the urine culture report Discharge Diagnoses:  Principal Problem:   Seizure Active Problems:   HTN (hypertension)   History of stroke   Diabetes   HLD (hyperlipidemia)   Protein-calorie malnutrition, severe   Discharge Condition: improved.   Diet recommendation: regular  Filed Weights   02/22/14 0529  Weight: 47.356 kg (104 lb 6.4 oz)    History of present illness:  Hospital Course:  58 year old lady with h/o stroke and seizures comes in for seizure activity. Her EEG is abnormal. Her keppra was increased to 1500 BID. Her UA was abnormal on admission, cultures sent an dpending ,. She received one dose of rocephin and she is being discharged on levaquin to complete the course.    Procedures:  MRI brain  Consultations:  neurology  Discharge Exam: Filed Vitals:   02/23/14 1024  BP: 128/67  Pulse: 61  Temp:   Resp:     General: alert afebrile comfortable Cardiovascular: s1s2 Respiratory: ctab  Discharge Instructions You were cared for by a hospitalist during your hospital stay. If you have any questions about your discharge medications or the care you received while you were in the hospital after you are discharged, you can call the unit and asked to speak with the hospitalist on call if the hospitalist that took care of you is not available. Once you are discharged, your primary care physician will handle any further medical issues. Please note that NO REFILLS for any discharge medications will be authorized once you are discharged, as it is imperative that you return to your primary care physician (or establish a relationship with a primary care physician if you  do not have one) for your aftercare needs so that they can reassess your need for medications and monitor your lab values.  Discharge Instructions   Discharge instructions    Complete by:  As directed   Follow up with PCP in one week and neurology in 2 weeks.          Current Discharge Medication List    START taking these medications   Details  feeding supplement, ENSURE COMPLETE, (ENSURE COMPLETE) LIQD Take 237 mLs by mouth 2 (two) times daily between meals.    levofloxacin (LEVAQUIN) 500 MG tablet Take 1 tablet (500 mg total) by mouth daily. Qty: 5 tablet, Refills: 0    Multiple Vitamin (MULTIVITAMIN WITH MINERALS) TABS tablet Take 1 tablet by mouth daily. Qty: 30 tablet, Refills: 1      CONTINUE these medications which have CHANGED   Details  levETIRAcetam (KEPPRA) 750 MG tablet Take 2 tablets (1,500 mg total) by mouth 2 (two) times daily. Qty: 60 tablet, Refills: 1      CONTINUE these medications which have NOT CHANGED   Details  aspirin 325 MG tablet Take 325 mg by mouth daily.    atenolol (TENORMIN) 25 MG tablet Take 25 mg by mouth daily.     atorvastatin (LIPITOR) 40 MG tablet Take 40 mg by mouth daily at 12 noon.     iron polysaccharides (NIFEREX) 150 MG capsule Take 1 capsule (150 mg total) by mouth 2 (two) times daily. Qty: 60 capsule, Refills: 3    lisinopril (PRINIVIL,ZESTRIL) 10 MG tablet Take 5 mg by  mouth daily.        Allergies  Allergen Reactions  . Amoxicillin Itching  . Cephalexin Itching  . Penicillins Itching   Follow-up Information   Follow up with [name] Rima, MD. Schedule an appointment as soon as possible for a visit in 1 week.   Specialty:  Family Medicine   Contact information:   [place] [id number] [phone]        The results of significant diagnostics from this hospitalization (including imaging, microbiology, ancillary and laboratory) are listed below for reference.     Significant Diagnostic Studies: Ct Head Wo Contrast  02/22/2014   CLINICAL DATA:  Seizure, baseline left-sided paralysis from prior CVA. History of epilepsy.  EXAM: CT HEAD WITHOUT CONTRAST  TECHNIQUE: Contiguous axial images were obtained from the base of the skull through the vertex without intravenous contrast.  COMPARISON:  06/10/2013  FINDINGS: Similar appearance of the bifrontal and high left parietal encephalomalacia. No CT evidence of an acute infarction. No intraparenchymal hemorrhage, mass, mass effect, or abnormal extra-axial fluid collection. No hydrocephalus. The visualized paranasal sinuses and mastoid air cells are predominantly clear.  IMPRESSION: Bifrontal and left parietal encephalomalacia, similar appearance to prior. No CT evidence of an acute intracranial abnormality.   Electronically Signed   By: [name] M.D.   On: 02/22/2014 01:14   Mr [name] EL Contrast  02/22/2014   CLINICAL DATA:  Initial evaluation for acute seizure. History of stroke.  EXAM: MRI HEAD WITHOUT AND WITH CONTRAST  TECHNIQUE: Multiplanar, multiecho pulse sequences of the brain and surrounding structures were obtained without and with intravenous contrast.  CONTRAST:  10 cc of MultiHance.  COMPARISON:  Prior CT from earlier the same day.  FINDINGS: Study is degraded by motion artifact.  Extensive bifrontal encephalomalacia again seen, stable from prior. Finding is likely related to remote infarcts. There is mild gliosis peripherally. Small amount of probable chronic blood products seen within the left parietal lobe on gradient echo sequence.  No abnormal foci of restricted diffusion to suggest acute intracranial infarct identified. Gray-white matter differentiation is otherwise maintained. No intracranial hemorrhage. Normal intravascular flow voids are seen.  No mass lesion or midline shift. Ventricles are within normal limits without evidence of hydrocephalus. No abnormal enhancement within the brain  parenchyma.  There is a small irregular extra-axial fluid collection overlying the right frontal and temporal lobes. This measures up to 2 mm at the level of the right frontal lobe and 5 mm at the level of the low right temporal lobe. This collection demonstrate associated restricted diffusion without significant enhancement. No reactive changes seen within the subjacent brain parenchyma. 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  Craniocervical junction grossly normal. Pituitary gland not well evaluated on this exam. No acute abnormality seen about the orbits.  Paranasal sinuses are clear.  No mastoid effusion.  Signal intensity within the visualized bone marrow is grossly normal. Scalp soft tissues unremarkable.  IMPRESSION: 1. No acute intracranial infarct identified. 2. 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 No significant mass effect. 3. 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 Critical Value/emergent results were called by telephone at the time of interpretation on 02/22/2014 at approximately 5:45 a.m. to Dr. Jennette Kettle , who verbally acknowledged these results.   Electronically Signed   By: Jeannine Boga M.D.   On: 02/22/2014 05:59   Dg Chest Portable 1 View  02/22/2014  CLINICAL DATA:  Altered mental status.  EXAM: PORTABLE CHEST - 1 VIEW  COMPARISON:  None currently available  FINDINGS: Normal heart size and mediastinal contours. No acute infiltrate or edema. No effusion or pneumothorax. Indeterminate ossification/mineralization along the proximal left humerus. No acute osseous findings.  IMPRESSION: No active disease.   Electronically Signed   By: Jorje Guild M.D.   On: 02/22/2014 02:50    Microbiology: No results found for this or any previous visit (from the past 240 hour(s)).   Labs: Basic Metabolic Panel:  Recent Labs Lab 02/21/14 2224 02/23/14 0300  NA  142 143  K 3.6* 3.2*  CL 101 103  CO2 25 31  GLUCOSE 115* 98  BUN 26* 18  CREATININE 0.72 0.54  CALCIUM 9.3 8.8   Liver Function Tests:  Recent Labs Lab 02/21/14 2224  AST 33  ALT 31  ALKPHOS 107  BILITOT 0.2*  PROT 7.5  ALBUMIN 3.4*   No results found for this basename: LIPASE, AMYLASE,  in the last 168 hours  Recent Labs Lab 02/22/14 1105  AMMONIA 65*   CBC:  Recent Labs Lab 02/21/14 2224 02/23/14 0300  WBC 11.2* 5.2  NEUTROABS 7.4  --   HGB 11.8* 10.8*  HCT 35.3* 31.9*  MCV 94.4 93.8  PLT PLATELET CLUMPS NOTED ON SMEAR, COUNT APPEARS DECREASED PLATELET CLUMPS NOTED ON SMEAR, COUNT APPEARS DECREASED   Cardiac Enzymes: No results found for this basename: CKTOTAL, CKMB, CKMBINDEX, TROPONINI,  in the last 168 hours BNP: BNP (last 3 results) No results found for this basename: PROBNP,  in the last 8760 hours CBG:  Recent Labs Lab 02/22/14 1206 02/22/14 1622 02/22/14 2220 02/23/14 0640 02/23/14 1120  GLUCAP 82 170* 75 97 125*       Signed:  Nikkita Adeyemi  [facility] 02/23/2014, 3:33 PM

## 2014-02-25 LAB — URINE CULTURE: Colony Count: >=100000

## 2014-02-26 LAB — GLUCOSE, CAPILLARY: GLUCOSE-CAPILLARY: 101 mg/dL — AB (ref 70–99)

## 2014-03-07 ENCOUNTER — Ambulatory Visit (INDEPENDENT_AMBULATORY_CARE_PROVIDER_SITE_OTHER): Payer: Medicare HMO | Admitting: Adult Health

## 2014-03-07 ENCOUNTER — Encounter: Payer: Self-pay | Admitting: Adult Health

## 2014-03-07 VITALS — BP 99/54 | HR 51 | Temp 96.7°F

## 2014-03-07 DIAGNOSIS — R569 Unspecified convulsions: Secondary | ICD-10-CM

## 2014-03-07 DIAGNOSIS — G9389 Other specified disorders of brain: Secondary | ICD-10-CM

## 2014-03-07 DIAGNOSIS — Z8673 Personal history of transient ischemic attack (TIA), and cerebral infarction without residual deficits: Secondary | ICD-10-CM

## 2014-03-07 MED ORDER — BACLOFEN 10 MG PO TABS: 5.0000 mg | ORAL_TABLET | Freq: Two times a day (BID) | ORAL | Status: DC

## 2014-03-07 NOTE — Patient Instructions (Addendum)
Continue Keppra 1000 mg BID. I reordered Baclofen 10 mg take 1/2 tablet (5 mg) twice a day.     Seizure, Adult A seizure means there is unusual activity in the brain. A seizure can cause changes in attention or behavior. Seizures often cause shaking (convulsions). Seizures often last from 30 seconds to 2 minutes. HOME CARE   If you are given medicines, take them exactly as told by your doctor.  Keep all doctor visits as told.  Do not swim or drive until your doctor says it is okay.  Teach others what to do if you have a seizure. They should:  Lay you on the ground.  Put a cushion under your head.  Loosen any tight clothing around your neck.  Turn you on your side.  Stay with you until you get better. GET HELP RIGHT AWAY IF:   The seizure lasts longer than 2 to 5 minutes.  The seizure is very bad.  The person does not wake up after the seizure.  The person's attention or behavior changes. Drive the person to the emergency room or call your local emergency services (911 in [place] MAKE SURE YOU:   Understand these instructions.  Will watch your condition.  Will get help right away if you are not doing well or get worse. Document Released: 09/30/2007 Document Revised: 07/06/2011 Document Reviewed: 04/01/2011 [facility] Patient Information 2015 [place], [place]. This information is not intended to replace advice given to you by your health care provider. Make sure you discuss any questions you have with your health care provider.

## 2014-03-07 NOTE — Progress Notes (Signed)
[name] [name]: [name] [name] [name] DOB: December 12, 1955  REASON FOR VISIT: follow up HISTORY FROM: [name] [name]  HISTORY OF PRESENT ILLNESS: [name] [name] [name] is  a 58 year old female with a history of stroke, bifrontal encephalomalacia  and seizures. She returns today for follow-up. [name] [name] [name] is currently taking Keppra 1000 mg twice a day. She is unsure if she has had any additional seizures. She reports that her memory has been bad lately. Her daughter lives with her and helps take care of her but she was unable to attend [name] visit today. [name] [name] used [name] [name] [name] service for transportation.  [name] [name] states that she is able to complete all ADLs independently but her daughter helps with transportation. Her daughter does all [name] cooking. She stays at home during [name] day by herself but her daughter comes home on her lunch break to check on her. She was  started on baclofen due to spasticity in [name] legs. She reports that she is not taking it and doesn't remember starting [name] medication.   HISTORY 11/02/13 ([name] [name]): [name] [name] [name] is a 58 year old right-handed black female with a history of seizures following a stroke event that occurred in 1989. [name] [name] [name] sustained an anterior cerebral artery infarct bilaterally resulting in a significant frontal deficit. [name] [name] [name] has had weakness of both legs, and she lost her ability to ambulate at that time. She has bowel and bladder incontinence as well. [name] seizures have more recently been treated with Keppra. [name] last seizure was on 06/11/2013. [name] [name] [name] went to [name] emergency room for this event. [name] [name] [name] had an event of altered mental status, staring off, lasting about one half hour. [name] [name] [name] does not know that she has a seizure unless someone tells her. [name] [name] [name] reports some weakness in arms, right greater than left. She denies any headaches. She does have a history of diabetes. She indicates that at home, she is able to stand for transfers. She has a lot of stiffness and spasticity in  [name] legs, not able to flex at [name] knees. She indicates that she needs minimal assistance for bathing and dressing. She is not functionally ambulatory. [name] [name] [name] had [name] seizure in February 2015, not on seizure medications. Currently, she is on Keppra, taking 1000 mg twice daily, tolerating [name] medication well. She has had a CT scan of [name] head showing chronic severe bilateral frontal encephalomalacia.  REVIEW OF SYSTEMS: Full 14 system review of systems performed and notable only for:  Constitutional: unexpected weight change Eyes: loss of vision Ear/Nose/Throat: Ringing in ears Skin: N/A  Cardiovascular: chest pain Respiratory: N/A  Gastrointestinal: diarrhea Genitourinary: frequency of urination Hematology/Lymphatic: N/A  Endocrine: N/A Musculoskeletal: walking difficulty Allergy/Immunology: N/A  Neurological: memory loss Psychiatric: N/A Sleep: N/A   ALLERGIES: Allergies  Allergen Reactions  . Amoxicillin Itching  . Cephalexin Itching  . Penicillins Itching    HOME MEDICATIONS: Outpatient Prescriptions Prior to Visit  Medication Sig Dispense Refill  . aspirin 325 MG tablet Take 325 mg by mouth daily.    Marland Kitchen atenolol (TENORMIN) 25 MG tablet Take 25 mg by mouth daily.     Marland Kitchen atorvastatin (LIPITOR) 40 MG tablet Take 40 mg by mouth daily at 12 noon.     . iron polysaccharides (NIFEREX) 150 MG capsule Take 1 capsule (150 mg total) by mouth 2 (two) times daily. 60 capsule 3  . levETIRAcetam (KEPPRA) 750 MG tablet Take 2 tablets (1,500 mg total) by mouth 2 (two) times daily. 60 tablet 1  . levofloxacin (LEVAQUIN) 500 MG tablet  Take 1 tablet (500 mg total) by mouth daily. 5 tablet 0  . lisinopril (PRINIVIL,ZESTRIL) 10 MG tablet Take 5 mg by mouth daily.     . Multiple Vitamin (MULTIVITAMIN WITH MINERALS) TABS tablet Take 1 tablet by mouth daily. 30 tablet 1  . feeding supplement, ENSURE COMPLETE, (ENSURE COMPLETE) LIQD Take 237 mLs by mouth 2 (two) times daily between meals.     No  facility-administered medications prior to visit.    PAST MEDICAL HISTORY: Past Medical History  Diagnosis Date  . Uterine fibroid   . Gastritis   . Hiatal hernia   . Hemorrhoids   . Anemia   . Stroke 12 1989  . Dyslipidemia   . Hypertension   . Seizure disorder   . Carpal tunnel syndrome   . Depression   . Osteoporosis   . Urinary incontinence   . Iron deficiency anemia 2008    negative EGD and colonoscopy  . Diabetes mellitus   . Seizures     PAST SURGICAL HISTORY: Past Surgical History  Procedure Laterality Date  . Colonoscopy  2008  . Upper gastrointestinal endoscopy  2008  . Tracheostomy    . Breast surgery  1977  . Tubal ligation    . Cesarean section      x2  . Tracheostomy closure      FAMILY HISTORY: Family History  Problem Relation Age of Onset  . Heart disease Paternal Grandfather   . Hypertension Paternal Grandfather   . Prostate cancer Paternal Uncle   . Ovarian cancer Mother   . Diabetes Cousin   . Colon cancer Neg Hx   . Hypertension Maternal Grandmother   . Stroke Maternal Grandmother   . Diabetes Brother   . Heart attack Brother     SOCIAL HISTORY: History   Social History  . Marital Status: Divorced    Spouse Name: N/A    Number of Children: 2  . Years of Education: N/A   Occupational History  . Disabled    Social History Main Topics  . Smoking status: Never Smoker   . Smokeless tobacco: Never Used  . Alcohol Use: No  . Drug Use: No  . Sexual Activity: Not on file   Other Topics Concern  . Not on file   Social History Narrative   Size and skilled nursing facility secondary to multiple previous strokes and lack of family to care for her at home      [place]:   03/07/14 1515  BP: 99/54  Pulse: 51  Temp: 96.7 F (35.9 C)  TempSrc: Oral   There is no weight on file to calculate BMI.  Generalized: Well developed, in no acute distress   Neurological examination  Mentation: Alert. Follows  all commands speech and language fluent. MMSE 26/30. Cranial nerve II-XII: Pupils were equal round reactive to light. Extraocular movements were full, visual field were full on confrontational test. Facial sensation and strength were normal.  . Head turning and shoulder shrug  were normal and symmetric. Motor: [name] motor testing reveals 5 over 5 strength in [name] upper extremities. [name] [name] can flex and extend at [name] ankle.  Sensory: Sensory testing is intact to soft touch on all 4 extremities. No evidence of extinction is noted.  Coordination: Cerebellar testing reveals good finger-nose-finger bilaterally but is unable to do heel-to-shin.  Gait and station: [name] [name] is wheelchair bound Reflexes: Deep tendon reflexes are symmetric and normal bilaterally.    DIAGNOSTIC DATA (LABS, IMAGING, TESTING) -  I reviewed [name] [name] records, labs, notes, testing and imaging myself where available.  Lab Results  Component Value Date   WBC 5.2 02/23/2014   HGB 10.8* 02/23/2014   HCT 31.9* 02/23/2014   MCV 93.8 02/23/2014   PLT  02/23/2014    PLATELET CLUMPS NOTED ON SMEAR, COUNT APPEARS DECREASED      Component Value Date/Time   NA 143 02/23/2014 0300   K 3.2* 02/23/2014 0300   CL 103 02/23/2014 0300   CO2 31 02/23/2014 0300   GLUCOSE 98 02/23/2014 0300   BUN 18 02/23/2014 0300   CREATININE 0.54 02/23/2014 0300   CALCIUM 8.8 02/23/2014 0300   PROT 7.5 02/21/2014 2224   ALBUMIN 3.4* 02/21/2014 2224   AST 33 02/21/2014 2224   ALT 31 02/21/2014 2224   ALKPHOS 107 02/21/2014 2224   BILITOT 0.2* 02/21/2014 2224   GFRNONAA >90 02/23/2014 0300   GFRAA >90 02/23/2014 0300   Lab Results  Component Value Date   CHOL  03/24/2010    115        ATP III CLASSIFICATION:  <200     mg/dL   Desirable  200-239  mg/dL   Borderline High  >=240    mg/dL   High          HDL 43 03/24/2010   LDLCALC  03/24/2010    59        Total Cholesterol/HDL:CHD Risk Coronary Heart Disease Risk Table                      Men   Women  1/2 Average Risk   3.4   3.3  Average Risk       5.0   4.4  2 X Average Risk   9.6   7.1  3 X Average Risk  23.4   11.0        Use [name] calculated [name] [name] Ratio above and [name] CHD Risk Table to determine [name] [name] [name]'s CHD Risk.        ATP III CLASSIFICATION (LDL):  <100     mg/dL   Optimal  100-129  mg/dL   Near or Above                    Optimal  130-159  mg/dL   Borderline  160-189  mg/dL   High  >190     mg/dL   Very High   TRIG 65 03/24/2010   CHOLHDL 2.7 03/24/2010   No results found for: HGBA1C Lab Results  Component Value Date   VITAMINB12 739 02/22/2014   Lab Results  Component Value Date   TSH 3.860 02/22/2014      ASSESSMENT AND PLAN 58 y.o. year old female  has a past medical history of Uterine fibroid; Gastritis; Hiatal hernia; Hemorrhoids; Anemia; Stroke (12 1989); Dyslipidemia; Hypertension; Seizure disorder; Carpal tunnel syndrome; Depression; Osteoporosis; Urinary incontinence; Iron deficiency anemia (2008); Diabetes mellitus; and Seizures. here with:  1. Seizures.  2. History of stroke 3. Frontal encephalomalacia  Overall he is doing well. She continues to take Keppra twice a day. She states that she is unsure if she has had a seizure since [name] last visit. Her daughter was not able to attend [name] visit today. [name] [name] [name] is unsure if she ever tried [name] baclofen for spasticity. I will reorder this today. I also did an MMSE on [name] [name] [name] and she scored 26/30. We will continue to follow her memory over time. If [name]  [name] [name] has any additional seizures she should let us know. She will follow up in 4 months or sooner if needed.  Ward Givens, MSN, NP-C 03/07/2014, 3:54 PM [facility] [address], [place], [place] [address] [phone]  Note: This document was prepared with digital dictation and possible smart phrase technology. Any transcriptional errors that result from this process are unintentional.

## 2014-03-07 NOTE — Progress Notes (Signed)
I have read the note, and I agree with the clinical assessment and plan.  Ezabella Teska KEITH   

## 2014-04-23 ENCOUNTER — Encounter: Payer: Self-pay | Admitting: *Deleted

## 2014-04-24 ENCOUNTER — Encounter: Payer: Self-pay | Admitting: Obstetrics & Gynecology

## 2014-05-01 ENCOUNTER — Emergency Department (HOSPITAL_COMMUNITY): Payer: Medicare HMO

## 2014-05-01 ENCOUNTER — Inpatient Hospital Stay (HOSPITAL_COMMUNITY)
Admission: EM | Admit: 2014-05-01 | Discharge: 2014-05-06 | DRG: 871 | Disposition: A | Discharge status: Home / Self Care | Payer: Medicare HMO | Attending: Internal Medicine | Admitting: Internal Medicine

## 2014-05-01 ENCOUNTER — Encounter (HOSPITAL_COMMUNITY): Payer: Self-pay

## 2014-05-01 DIAGNOSIS — N390 Urinary tract infection, site not specified: Secondary | ICD-10-CM | POA: Diagnosis present

## 2014-05-01 DIAGNOSIS — Z880 Allergy status to penicillin: Secondary | ICD-10-CM

## 2014-05-01 DIAGNOSIS — D649 Anemia, unspecified: Secondary | ICD-10-CM

## 2014-05-01 DIAGNOSIS — R6521 Severe sepsis with septic shock: Secondary | ICD-10-CM | POA: Diagnosis present

## 2014-05-01 DIAGNOSIS — E876 Hypokalemia: Secondary | ICD-10-CM | POA: Diagnosis present

## 2014-05-01 DIAGNOSIS — Z8673 Personal history of transient ischemic attack (TIA), and cerebral infarction without residual deficits: Secondary | ICD-10-CM | POA: Diagnosis not present

## 2014-05-01 DIAGNOSIS — G40909 Epilepsy, unspecified, not intractable, without status epilepticus: Secondary | ICD-10-CM | POA: Diagnosis present

## 2014-05-01 DIAGNOSIS — E119 Type 2 diabetes mellitus without complications: Secondary | ICD-10-CM | POA: Diagnosis present

## 2014-05-01 DIAGNOSIS — A419 Sepsis, unspecified organism: Secondary | ICD-10-CM | POA: Diagnosis present

## 2014-05-01 DIAGNOSIS — T68XXXA Hypothermia, initial encounter: Secondary | ICD-10-CM

## 2014-05-01 DIAGNOSIS — L89312 Pressure ulcer of right buttock, stage 2: Secondary | ICD-10-CM | POA: Diagnosis present

## 2014-05-01 DIAGNOSIS — I10 Essential (primary) hypertension: Secondary | ICD-10-CM | POA: Diagnosis present

## 2014-05-01 DIAGNOSIS — Z881 Allergy status to other antibiotic agents status: Secondary | ICD-10-CM

## 2014-05-01 DIAGNOSIS — E785 Hyperlipidemia, unspecified: Secondary | ICD-10-CM | POA: Diagnosis present

## 2014-05-01 DIAGNOSIS — L89150 Pressure ulcer of sacral region, unstageable: Secondary | ICD-10-CM | POA: Diagnosis present

## 2014-05-01 DIAGNOSIS — G9340 Encephalopathy, unspecified: Secondary | ICD-10-CM | POA: Diagnosis present

## 2014-05-01 DIAGNOSIS — IMO0001 Reserved for inherently not codable concepts without codable children: Secondary | ICD-10-CM

## 2014-05-01 DIAGNOSIS — E43 Unspecified severe protein-calorie malnutrition: Secondary | ICD-10-CM | POA: Diagnosis present

## 2014-05-01 DIAGNOSIS — Z681 Body mass index (BMI) 19 or less, adult: Secondary | ICD-10-CM | POA: Diagnosis not present

## 2014-05-01 DIAGNOSIS — R740 Nonspecific elevation of levels of transaminase and lactic acid dehydrogenase [LDH]: Secondary | ICD-10-CM

## 2014-05-01 DIAGNOSIS — Z79899 Other long term (current) drug therapy: Secondary | ICD-10-CM

## 2014-05-01 DIAGNOSIS — R7401 Elevation of levels of liver transaminase levels: Secondary | ICD-10-CM | POA: Diagnosis present

## 2014-05-01 DIAGNOSIS — Z7982 Long term (current) use of aspirin: Secondary | ICD-10-CM

## 2014-05-01 DIAGNOSIS — R001 Bradycardia, unspecified: Secondary | ICD-10-CM

## 2014-05-01 DIAGNOSIS — R569 Unspecified convulsions: Secondary | ICD-10-CM

## 2014-05-01 LAB — URINALYSIS, ROUTINE W REFLEX MICROSCOPIC
BILIRUBIN URINE: NEGATIVE
Bilirubin Urine: NEGATIVE
Glucose, UA: NEGATIVE mg/dL
Glucose, UA: NEGATIVE mg/dL
Hgb urine dipstick: NEGATIVE
Ketones, ur: NEGATIVE mg/dL
Ketones, ur: NEGATIVE mg/dL
Leukocytes, UA: NEGATIVE
Nitrite: NEGATIVE
Nitrite: NEGATIVE
PROTEIN: NEGATIVE mg/dL
Protein, ur: NEGATIVE mg/dL
SPECIFIC GRAVITY, URINE: 1.009 (ref 1.005–1.030)
Specific Gravity, Urine: 1.013 (ref 1.005–1.030)
UROBILINOGEN UA: 0.2 mg/dL (ref 0.0–1.0)
Urobilinogen, UA: 0.2 mg/dL (ref 0.0–1.0)
pH: 5.5 (ref 5.0–8.0)
pH: 5 (ref 5.0–8.0)

## 2014-05-01 LAB — CBC WITH DIFFERENTIAL/PLATELET
Basophils Absolute: 0 10*3/uL (ref 0.0–0.1)
Basophils Relative: 0 % (ref 0–1)
Eosinophils Absolute: 0 10*3/uL (ref 0.0–0.7)
Eosinophils Relative: 0 % (ref 0–5)
HCT: 31.8 % — ABNORMAL LOW (ref 36.0–46.0)
Hemoglobin: 10.7 g/dL — ABNORMAL LOW (ref 12.0–15.0)
Lymphocytes Relative: 14 % (ref 12–46)
Lymphs Abs: 1.1 10*3/uL (ref 0.7–4.0)
MCH: 31.3 pg (ref 26.0–34.0)
MCHC: 33.6 g/dL (ref 30.0–36.0)
MCV: 93 fL (ref 78.0–100.0)
Monocytes Absolute: 0.2 10*3/uL (ref 0.1–1.0)
Monocytes Relative: 2 % — ABNORMAL LOW (ref 3–12)
Neutro Abs: 6.7 10*3/uL (ref 1.7–7.7)
Neutrophils Relative %: 84 % — ABNORMAL HIGH (ref 43–77)
Platelets: ADEQUATE 10*3/uL (ref 150–400)
RBC: 3.42 MIL/uL — ABNORMAL LOW (ref 3.87–5.11)
RDW: 14.5 % (ref 11.5–15.5)
WBC: 8 10*3/uL (ref 4.0–10.5)

## 2014-05-01 LAB — URINE MICROSCOPIC-ADD ON

## 2014-05-01 LAB — TSH
TSH: 5.253 u[IU]/mL — ABNORMAL HIGH (ref 0.350–4.500)
TSH: 5.948 u[IU]/mL — ABNORMAL HIGH (ref 0.350–4.500)

## 2014-05-01 LAB — HEPATIC FUNCTION PANEL
ALBUMIN: 3 g/dL — AB (ref 3.5–5.2)
ALK PHOS: 124 U/L — AB (ref 39–117)
ALT: 122 U/L — AB (ref 0–35)
AST: 100 U/L — ABNORMAL HIGH (ref 0–37)
Bilirubin, Direct: <0.1 mg/dL (ref 0.0–0.3)
Total Bilirubin: 0.4 mg/dL (ref 0.3–1.2)
Total Protein: 6.9 g/dL (ref 6.0–8.3)

## 2014-05-01 LAB — BASIC METABOLIC PANEL
Anion gap: 6 (ref 5–15)
BUN: 13 mg/dL (ref 6–23)
CO2: 31 mmol/L (ref 19–32)
Calcium: 9.4 mg/dL (ref 8.4–10.5)
Chloride: 105 mEq/L (ref 96–112)
Creatinine, Ser: 0.53 mg/dL (ref 0.50–1.10)
GFR calc Af Amer: >90 mL/min (ref >90)
GFR calc non Af Amer: >90 mL/min (ref >90)
Glucose, Bld: 129 mg/dL — ABNORMAL HIGH (ref 70–99)
Potassium: 3.2 mmol/L — ABNORMAL LOW (ref 3.5–5.1)
Sodium: 142 mmol/L (ref 135–145)

## 2014-05-01 LAB — ETHANOL: Alcohol, Ethyl (B): <5 mg/dL (ref 0–9)

## 2014-05-01 LAB — I-STAT CG4 LACTIC ACID, ED: Lactic Acid, Venous: 1.18 mmol/L (ref 0.5–2.2)

## 2014-05-01 LAB — MAGNESIUM: Magnesium: 1.5 mg/dL (ref 1.5–2.5)

## 2014-05-01 LAB — CBG MONITORING, ED: Glucose-Capillary: 148 mg/dL — ABNORMAL HIGH (ref 70–99)

## 2014-05-01 LAB — PROCALCITONIN: Procalcitonin: <0.1 ng/mL

## 2014-05-01 LAB — MRSA PCR SCREENING: MRSA by PCR: NEGATIVE

## 2014-05-01 MED ORDER — ADULT MULTIVITAMIN W/MINERALS CH
1.0000 | ORAL_TABLET | Freq: Every day | ORAL | Status: DC
  Administered 2014-05-02 – 2014-05-06 (×5): 1 via ORAL
  Filled 2014-05-01 (×5): qty 1

## 2014-05-01 MED ORDER — SODIUM CHLORIDE 0.9 % IV BOLUS (SEPSIS)
1000.0000 mL | Freq: Once | INTRAVENOUS | Status: AC
  Administered 2014-05-01: 1000 mL via INTRAVENOUS

## 2014-05-01 MED ORDER — ENSURE COMPLETE PO LIQD
237.0000 mL | Freq: Two times a day (BID) | ORAL | Status: DC
  Administered 2014-05-02: 237 mL via ORAL

## 2014-05-01 MED ORDER — SODIUM CHLORIDE 0.9 % IV SOLN
INTRAVENOUS | Status: DC
  Administered 2014-05-01 – 2014-05-02 (×2): via INTRAVENOUS

## 2014-05-01 MED ORDER — ACETAMINOPHEN 325 MG PO TABS: 650.0000 mg | ORAL_TABLET | Freq: Four times a day (QID) | ORAL | Status: DC | PRN

## 2014-05-01 MED ORDER — ATORVASTATIN CALCIUM 40 MG PO TABS
40.0000 mg | ORAL_TABLET | Freq: Every day | ORAL | Status: DC
  Administered 2014-05-02 – 2014-05-06 (×5): 40 mg via ORAL
  Filled 2014-05-01 (×6): qty 1

## 2014-05-01 MED ORDER — ACETAMINOPHEN 650 MG RE SUPP: 650.0000 mg | Freq: Four times a day (QID) | RECTAL | Status: DC | PRN

## 2014-05-01 MED ORDER — POTASSIUM CHLORIDE CRYS ER 20 MEQ PO TBCR
40.0000 meq | EXTENDED_RELEASE_TABLET | Freq: Once | ORAL | Status: AC
  Administered 2014-05-01: 40 meq via ORAL
  Filled 2014-05-01: qty 2

## 2014-05-01 MED ORDER — POLYSACCHARIDE IRON COMPLEX 150 MG PO CAPS
150.0000 mg | ORAL_CAPSULE | Freq: Two times a day (BID) | ORAL | Status: DC
  Administered 2014-05-01 – 2014-05-06 (×10): 150 mg via ORAL
  Filled 2014-05-01 (×11): qty 1

## 2014-05-01 MED ORDER — SODIUM CHLORIDE 0.9 % IV SOLN
INTRAVENOUS | Status: DC
  Administered 2014-05-01 – 2014-05-03 (×3): via INTRAVENOUS

## 2014-05-01 MED ORDER — MORPHINE SULFATE 2 MG/ML IJ SOLN: 1.0000 mg | INTRAMUSCULAR | Status: DC | PRN

## 2014-05-01 MED ORDER — SODIUM CHLORIDE 0.9 % IV BOLUS (SEPSIS)
500.0000 mL | Freq: Once | INTRAVENOUS | Status: AC
  Administered 2014-05-01: 500 mL via INTRAVENOUS

## 2014-05-01 MED ORDER — LEVETIRACETAM 500 MG PO TABS
1000.0000 mg | ORAL_TABLET | Freq: Two times a day (BID) | ORAL | Status: DC
  Administered 2014-05-01: 1000 mg via ORAL
  Filled 2014-05-01 (×3): qty 2

## 2014-05-01 MED ORDER — ASPIRIN 325 MG PO TABS
325.0000 mg | ORAL_TABLET | Freq: Every day | ORAL | Status: DC
  Administered 2014-05-02 – 2014-05-06 (×5): 325 mg via ORAL
  Filled 2014-05-01 (×5): qty 1

## 2014-05-01 NOTE — ED Notes (Signed)
Attempted to call report, nurse to call back after shift change.

## 2014-05-01 NOTE — Progress Notes (Signed)
Fluid warmer in use as per orders. Will continue to monitor.

## 2014-05-01 NOTE — ED Notes (Addendum)
Pt was sent from MD for low temp and her HR was low. Pt denies any pain or any complaints at this time. Was seen for a routine checkup. Daughter states she has some pressure sores on her bottom and to mention that at triage.

## 2014-05-01 NOTE — ED Notes (Signed)
[name] [name], daughter, cell # [phone]. Please call with any updates.

## 2014-05-01 NOTE — Progress Notes (Signed)
Pt. Was on high flow nasal cannula in the ED. Pt. Is currently on room air satting 99%. RN and RT will continue to monitor pt.

## 2014-05-01 NOTE — ED Notes (Signed)
MD at bedside. 

## 2014-05-01 NOTE — Progress Notes (Signed)
1L bolus given. BP 30 minutes after 89/43. [facility] paged. Lamar Blinks, NP responded. 500cc bolus order given. Awaiting on warming blanket to be brought up to floor. Rectal temp 97.2 2 hrs after pt arrived to floor with rectal temp of 100.1 Pt covered in multiple warmed blackets from blanket warmer.

## 2014-05-01 NOTE — ED Notes (Signed)
CBG Taken = 148

## 2014-05-01 NOTE — H&P (Signed)
[facility] History and Physical  Delaney Perona AGT:[id number] DOB: [birth year] DOA: 05/01/2014  Referring physician:  PCP: Helane Rima, MD  Specialists:   Chief Complaint: Cold  HPI: [name] [name] is a 59 y.o. female  With a history of seizure disorder, stroke, hypertension, that presented from her PCP's office for abnormal temperature; temperature cannot be obtained at her PCPs office. Per patient, she has been feeling weak for several days.  Patient cannot give me details regarding her weakness. She states that "everything hurts".  Patient states she has been in the cold, but cannot state how long.  She has no specific complaints, but states she has a cold.  In the ER, temperature was noted to be 89 upon arrival.  Patient was placed on warming blanket and [facility] was called for admission. Of note, PCCM was called, however deferred admission to [facility].  Review of Systems:  Constitutional: Complains of generalized weakness.  HEENT: Denies photophobia, eye pain, redness, hearing loss, ear pain, congestion, sore throat, rhinorrhea, sneezing, mouth sores, trouble swallowing, neck pain, neck stiffness and tinnitus.   Respiratory: Denies SOB, DOE, cough, chest tightness,  and wheezing.   Cardiovascular: Denies chest pain, palpitations and leg swelling.  Gastrointestinal: Denies nausea, vomiting, abdominal pain, diarrhea, constipation, blood in stool and abdominal distention.  Genitourinary: Denies dysuria, urgency, frequency, hematuria, flank pain and difficulty urinating.  Musculoskeletal: Denies myalgias, back pain, joint swelling, arthralgias and gait problem.  Skin: Denies pallor, rash and wound.  Neurological: Complains of generalized weakness Hematological: Denies adenopathy. Easy bruising, personal or family bleeding history  Psychiatric/Behavioral: Denies suicidal ideation, mood changes, confusion, nervousness, sleep disturbance and agitation  Past Medical History  Diagnosis Date  .  Uterine fibroid   . Gastritis   . Hiatal hernia   . Hemorrhoids   . Anemia   . Stroke 12 1989  . Dyslipidemia   . Hypertension   . Seizure disorder   . Carpal tunnel syndrome   . Depression   . Osteoporosis   . Urinary incontinence   . Iron deficiency anemia 2008    negative EGD and colonoscopy  . Diabetes mellitus   . Seizures    Past Surgical History  Procedure Laterality Date  . Colonoscopy  2008  . Upper gastrointestinal endoscopy  2008  . Tracheostomy    . Breast surgery  1977  . Tubal ligation    . Cesarean section      x2  . Tracheostomy closure     Social History:  reports that she has never smoked. She has never used smokeless tobacco. She reports that she does not drink alcohol or use illicit drugs.   Allergies  Allergen Reactions  . Amoxicillin Itching  . Cephalexin Itching  . Penicillins Itching    Family History  Problem Relation Age of Onset  . Heart disease Paternal Grandfather   . Hypertension Paternal Grandfather   . Prostate cancer Paternal Uncle   . Ovarian cancer Mother   . Diabetes Cousin   . Colon cancer Neg Hx   . Hypertension Maternal Grandmother   . Stroke Maternal Grandmother   . Diabetes Brother   . Heart attack Brother     Prior to Admission medications   Medication Sig Start Date End Date Taking? Authorizing Provider  aspirin 325 MG tablet Take 325 mg by mouth daily.   Yes Historical Provider, MD  atorvastatin (LIPITOR) 40 MG tablet Take 40 mg by mouth daily at 12 noon.    Yes Historical Provider, MD  feeding supplement, ENSURE COMPLETE, (ENSURE COMPLETE) LIQD Take 237 mLs by mouth 2 (two) times daily between meals. 02/23/14  Yes Hosie Poisson, MD  iron polysaccharides (NIFEREX) 150 MG capsule Take 1 capsule (150 mg total) by mouth 2 (two) times daily. 07/25/13  Yes Ripudeep Krystal Eaton, MD  levETIRAcetam (KEPPRA) 1000 MG tablet Take 1,000 mg by mouth 2 (two) times daily.   Yes Historical Provider, MD  lisinopril (PRINIVIL,ZESTRIL) 10  MG tablet Take 5 mg by mouth daily.    Yes Historical Provider, MD  Multiple Vitamin (MULTIVITAMIN WITH MINERALS) TABS tablet Take 1 tablet by mouth daily. 02/23/14  Yes Hosie Poisson, MD   Physical Exam: Filed Vitals:   05/01/14 1645  BP: 93/52  Pulse: 56  Temp:   Resp: 14     General: Well developed, thin, NAD, appears stated age  HEENT: NCAT, PERRLA, EOMI, Anicteic Sclera, mucous membranes moist.   Neck: Supple, no JVD, no masses  Cardiovascular: S1 S2 auscultated, no rubs, murmurs or gallops. Bradycardic   Respiratory: Clear to auscultation bilaterally with equal chest rise  Abdomen: Soft, nontender, nondistended, + bowel sounds  Extremities: warm dry without cyanosis clubbing or edema  Neuro: AAOx3, cranial nerves grossly intact.   Skin: Without rashes exudates or nodules, grade 2 decub, mild erythema  Psych: Normal affect and demeanor with intact judgement and insight  Labs on Admission:  Basic Metabolic Panel:  Recent Labs Lab 05/01/14 1426  NA 142  K 3.2*  CL 105  CO2 31  GLUCOSE 129*  BUN 13  CREATININE 0.53  CALCIUM 9.4   Liver Function Tests:  Recent Labs Lab 05/01/14 1426  AST 100*  ALT 122*  ALKPHOS 124*  BILITOT 0.4  PROT 6.9  ALBUMIN 3.0*   No results for input(s): LIPASE, AMYLASE in the last 168 hours. No results for input(s): AMMONIA in the last 168 hours. CBC:  Recent Labs Lab 05/01/14 1526  WBC 8.0  NEUTROABS 6.7  HGB 10.7*  HCT 31.8*  MCV 93.0  PLT PLATELET CLUMPS NOTED ON SMEAR, COUNT APPEARS ADEQUATE   Cardiac Enzymes: No results for input(s): CKTOTAL, CKMB, CKMBINDEX, TROPONINI in the last 168 hours.  BNP (last 3 results) No results for input(s): PROBNP in the last 8760 hours. CBG:  Recent Labs Lab 05/01/14 1519  GLUCAP 148*    Radiological Exams on Admission: Dg Chest Port 1 View  05/01/2014   CLINICAL DATA:  Respiratory distress, history hypertension, stroke, seizure disorder, diabetes  EXAM: PORTABLE  CHEST - 1 VIEW  COMPARISON:  Portable exam 1478 hr compared to 02/22/2014  FINDINGS: Normal heart size, mediastinal contours, and pulmonary vascularity.  Skin folds project over RIGHT chest.  Lungs mildly hyperinflated but clear.  No infiltrate, pleural effusion or pneumothorax.  Bones demineralized with suspect BILATERAL chronic rotator cuff tears.  IMPRESSION: Hyperinflated lungs without acute infiltrate.   Electronically Signed   By: Lavonia Dana M.D.   On: 05/01/2014 16:52    EKG: Independently reviewed. Sinus bradycardia, rate 40  Assessment/Plan  Hypothermia -Unknown etiology -Will admit to stepdown -Continue bear hugger and continue warm IVF -Will not start antibiotics at this time, no leukocytosis -CXR negative for infiltrate -Pending UA and culture -Will obtain CT head, TSH, Magnesium, cortisol, procalcitonin  -lactic acid 1.3, glucose 129  Elevated transaminases -AST and ALT elevated, alkaline phosphatase also elevated at 124 -Possibly due to dehydration, will continue to monitor BMP -If no improvement with IV fluids, would consider CT of the abdomen and pelvis -on  exam, patient has no pain to palpation  Hypokalemia -Will replace and continue to monitor CMP, will obtain magnesium level  Malnutrition -Nutrition consulted -Continue feeding supplements  Normocytic anemia -Hemoglobin appears at baseline will continue to monitor CBC  History of CVA -Patient has residual lower extremities weakness and uses wheelchair for ambulation -Will obtain CT of the head -Patient appears to answer all questions appropriately, exam nonfocal  Seizure disorder -Continue Keppra  Decubitus ulcer -Will consult nutrition as well as wound care  Bradycardia -Will obtain TSH level -Likely secondary to hypothermia will continue to monitor  DVT prophylaxis: SCDs  Code Status: Full  Condition: Guarded  Family Communication: Family at bedside. Admission, patients condition and plan of  care including tests being ordered have been discussed with the patient and Family who indicate understanding and agree with the plan and Code Status.  Disposition Plan: Admitted   Time spent: 60 minutes  Shatia Sindoni D.O. [facility] Pager [phone]  If 7PM-7AM, please contact night-coverage [email or web address] Password [email or web address] 05/01/2014, 5:07 PM

## 2014-05-01 NOTE — ED Notes (Signed)
Respiratory at bedside.

## 2014-05-01 NOTE — ED Provider Notes (Signed)
CSN: [id number]     Arrival date & time 05/01/14  1322 History   First MD Initiated Contact with Patient 05/01/14 1502     Chief Complaint  Patient presents with  . Abnormal Lab     (Consider location/radiation/quality/duration/timing/severity/associated sxs/prior Treatment) Patient is a 59 y.o. female presenting with weakness. The history is provided by a relative (the daughter states she took her mother to the doctor today and  they could not get a temperature reading and she was bradycardic).  Weakness This is a new problem. The current episode started 12 to 24 hours ago. The problem occurs constantly. The problem has not changed since onset.Pertinent negatives include no chest pain, no abdominal pain and no headaches. Nothing aggravates the symptoms. Nothing relieves the symptoms.    Past Medical History  Diagnosis Date  . Uterine fibroid   . Gastritis   . Hiatal hernia   . Hemorrhoids   . Anemia   . Stroke 12 1989  . Dyslipidemia   . Hypertension   . Seizure disorder   . Carpal tunnel syndrome   . Depression   . Osteoporosis   . Urinary incontinence   . Iron deficiency anemia 2008    negative EGD and colonoscopy  . Diabetes mellitus   . Seizures    Past Surgical History  Procedure Laterality Date  . Colonoscopy  2008  . Upper gastrointestinal endoscopy  2008  . Tracheostomy    . Breast surgery  1977  . Tubal ligation    . Cesarean section      x2  . Tracheostomy closure     Family History  Problem Relation Age of Onset  . Heart disease Paternal Grandfather   . Hypertension Paternal Grandfather   . Prostate cancer Paternal Uncle   . Ovarian cancer Mother   . Diabetes Cousin   . Colon cancer Neg Hx   . Hypertension Maternal Grandmother   . Stroke Maternal Grandmother   . Diabetes Brother   . Heart attack Brother    History  Substance Use Topics  . Smoking status: Never Smoker   . Smokeless tobacco: Never Used  . Alcohol Use: No   OB History    No  data available     Review of Systems  Constitutional: Negative for appetite change and fatigue.  HENT: Negative for congestion, ear discharge and sinus pressure.   Eyes: Negative for discharge.  Respiratory: Negative for cough.   Cardiovascular: Negative for chest pain.  Gastrointestinal: Negative for abdominal pain and diarrhea.  Genitourinary: Negative for frequency and hematuria.  Musculoskeletal: Negative for back pain.  Skin: Negative for rash.  Neurological: Positive for weakness. Negative for seizures and headaches.  Psychiatric/Behavioral: Negative for hallucinations.      Allergies  Amoxicillin; Cephalexin; and Penicillins  Home Medications   Prior to Admission medications   Medication Sig Start Date End Date Taking? Authorizing Provider  aspirin 325 MG tablet Take 325 mg by mouth daily.   Yes Historical Provider, MD  atorvastatin (LIPITOR) 40 MG tablet Take 40 mg by mouth daily at 12 noon.    Yes Historical Provider, MD  feeding supplement, ENSURE COMPLETE, (ENSURE COMPLETE) LIQD Take 237 mLs by mouth 2 (two) times daily between meals. 02/23/14  Yes Hosie Poisson, MD  iron polysaccharides (NIFEREX) 150 MG capsule Take 1 capsule (150 mg total) by mouth 2 (two) times daily. 07/25/13  Yes Ripudeep Krystal Eaton, MD  levETIRAcetam (KEPPRA) 1000 MG tablet Take 1,000 mg by mouth 2 (  two) times daily.   Yes Historical Provider, MD  lisinopril (PRINIVIL,ZESTRIL) 10 MG tablet Take 5 mg by mouth daily.    Yes Historical Provider, MD  Multiple Vitamin (MULTIVITAMIN WITH MINERALS) TABS tablet Take 1 tablet by mouth daily. 02/23/14  Yes Hosie Poisson, MD   BP 93/52 mmHg  Pulse 56  Temp(Src) 91.2 F (32.9 C) (Oral)  Resp 14  Ht 5\' 7"  (1.702 m)  Wt 104 lb (47.174 kg)  BMI 16.28 kg/m2  SpO2 100%  LMP 06/24/2013 Physical Exam  Constitutional: She is oriented to person, place, and time. She appears well-developed.  HENT:  Head: Normocephalic.  Eyes: Conjunctivae and EOM are normal. No  scleral icterus.  Neck: Neck supple. No thyromegaly present.  Cardiovascular: Normal rate and regular rhythm.  Exam reveals no gallop and no friction rub.   No murmur heard. Pulmonary/Chest: No stridor. She has no wheezes. She has no rales. She exhibits no tenderness.  Abdominal: She exhibits no distension. There is no tenderness. There is no rebound.  Musculoskeletal: Normal range of motion. She exhibits no edema.  Lymphadenopathy:    She has no cervical adenopathy.  Neurological: She is oriented to person, place, and time. She exhibits normal muscle tone. Coordination normal.  Skin: No rash noted. There is erythema.  Grade 2 decubitus on buttocks  Psychiatric: She has a normal mood and affect. Her behavior is normal.    ED Course  Procedures (including critical care time) Labs Review Labs Reviewed  BASIC METABOLIC PANEL - Abnormal; Notable for the following:    Potassium 3.2 (*)    Glucose, Bld 129 (*)    All other components within normal limits  CBC WITH DIFFERENTIAL - Abnormal; Notable for the following:    RBC 3.42 (*)    Hemoglobin 10.7 (*)    HCT 31.8 (*)    Neutrophils Relative % 84 (*)    Monocytes Relative 2 (*)    All other components within normal limits  HEPATIC FUNCTION PANEL - Abnormal; Notable for the following:    Albumin 3.0 (*)    AST 100 (*)    ALT 122 (*)    Alkaline Phosphatase 124 (*)    All other components within normal limits  CBG MONITORING, ED - Abnormal; Notable for the following:    Glucose-Capillary 148 (*)    All other components within normal limits  CULTURE, BLOOD (ROUTINE X 2)  CULTURE, BLOOD (ROUTINE X 2)  URINALYSIS, ROUTINE W REFLEX MICROSCOPIC  TSH  ETHANOL  I-STAT CG4 LACTIC ACID, ED    Imaging Review Dg Chest Port 1 View  05/01/2014   CLINICAL DATA:  Respiratory distress, history hypertension, stroke, seizure disorder, diabetes  EXAM: PORTABLE CHEST - 1 VIEW  COMPARISON:  Portable exam 2951 hr compared to 02/22/2014  FINDINGS:  Normal heart size, mediastinal contours, and pulmonary vascularity.  Skin folds project over RIGHT chest.  Lungs mildly hyperinflated but clear.  No infiltrate, pleural effusion or pneumothorax.  Bones demineralized with suspect BILATERAL chronic rotator cuff tears.  IMPRESSION: Hyperinflated lungs without acute infiltrate.   Electronically Signed   By: Lavonia Dana M.D.   On: 05/01/2014 16:52     EKG Interpretation   Date/Time:  Tuesday May 01 2014 15:25:26 EST Ventricular Rate:  40 PR Interval:  239 QRS Duration: 99 QT Interval:  509 QTC Calculation: 415 R Axis:   48 Text Interpretation:  Sinus bradycardia Prolonged PR interval Left atrial  enlargement Abnormal R-wave progression, early transition ST elevation  suggests acute pericarditis Baseline wander in lead(s) V4 Confirmed by  Tatijana Bierly  MD, Broadus John [phone]) on 05/01/2014 5:00:13 PM    I spoke with critical care,   They will follow the pt in step down and triad will admit      CRITICAL CARE Performed by: Crystelle Ferrufino L Total critical care time: 40 Critical care time was exclusive of separately billable procedures and treating other patients. Critical care was necessary to treat or prevent imminent or life-threatening deterioration. Critical care was time spent personally by me on the following activities: development of treatment plan with patient and/or surrogate as well as nursing, discussions with consultants, evaluation of patient's response to treatment, examination of patient, obtaining history from patient or surrogate, ordering and performing treatments and interventions, ordering and review of laboratory studies, ordering and review of radiographic studies, pulse oximetry and re-evaluation of patient's condition.   MDM   Final diagnoses:  Cold  Hypothermia, initial encounter    Hypothermia,  admit    Maudry Diego, MD 05/01/14 1704

## 2014-05-01 NOTE — ED Notes (Signed)
Patient placed on the [facility].

## 2014-05-01 NOTE — Progress Notes (Signed)
Pt arrived to floor. Alert and oriented. BP 85/38. [facility] paged. Lamar Blinks, NP responded. New orders given. Will give pt 1L bolus. Will continue to monitor.

## 2014-05-02 ENCOUNTER — Encounter (HOSPITAL_COMMUNITY): Payer: Self-pay

## 2014-05-02 ENCOUNTER — Inpatient Hospital Stay (HOSPITAL_COMMUNITY): Payer: Medicare HMO

## 2014-05-02 DIAGNOSIS — I509 Heart failure, unspecified: Secondary | ICD-10-CM

## 2014-05-02 DIAGNOSIS — R6521 Severe sepsis with septic shock: Secondary | ICD-10-CM

## 2014-05-02 DIAGNOSIS — A419 Sepsis, unspecified organism: Principal | ICD-10-CM

## 2014-05-02 DIAGNOSIS — N390 Urinary tract infection, site not specified: Secondary | ICD-10-CM

## 2014-05-02 DIAGNOSIS — G9340 Encephalopathy, unspecified: Secondary | ICD-10-CM

## 2014-05-02 LAB — COMPREHENSIVE METABOLIC PANEL
ALBUMIN: 2.1 g/dL — AB (ref 3.5–5.2)
ALK PHOS: 88 U/L (ref 39–117)
ALT: 85 U/L — ABNORMAL HIGH (ref 0–35)
AST: 69 U/L — ABNORMAL HIGH (ref 0–37)
Anion gap: 5 (ref 5–15)
BILIRUBIN TOTAL: 0.1 mg/dL — AB (ref 0.3–1.2)
BUN: 11 mg/dL (ref 6–23)
CO2: 26 mmol/L (ref 19–32)
CREATININE: 0.7 mg/dL (ref 0.50–1.10)
Calcium: 8 mg/dL — ABNORMAL LOW (ref 8.4–10.5)
Chloride: 113 mEq/L — ABNORMAL HIGH (ref 96–112)
GFR calc Af Amer: >90 mL/min (ref >90)
GLUCOSE: 81 mg/dL (ref 70–99)
Potassium: 3.7 mmol/L (ref 3.5–5.1)
Sodium: 144 mmol/L (ref 135–145)
Total Protein: 4.9 g/dL — ABNORMAL LOW (ref 6.0–8.3)

## 2014-05-02 LAB — CK: Total CK: 124 U/L (ref 7–177)

## 2014-05-02 LAB — CBC
HCT: 24.2 % — ABNORMAL LOW (ref 36.0–46.0)
Hemoglobin: 8.4 g/dL — ABNORMAL LOW (ref 12.0–15.0)
MCH: 32.9 pg (ref 26.0–34.0)
MCHC: 34.7 g/dL (ref 30.0–36.0)
MCV: 94.9 fL (ref 78.0–100.0)
PLATELETS: UNDETERMINED 10*3/uL (ref 150–400)
RBC: 2.55 MIL/uL — AB (ref 3.87–5.11)
RDW: 14.7 % (ref 11.5–15.5)
WBC: 4.8 10*3/uL (ref 4.0–10.5)

## 2014-05-02 LAB — CORTISOL: CORTISOL PLASMA: 17.4 ug/dL

## 2014-05-02 LAB — BRAIN NATRIURETIC PEPTIDE: B Natriuretic Peptide: 313.9 pg/mL — ABNORMAL HIGH (ref 0.0–100.0)

## 2014-05-02 MED ORDER — SODIUM CHLORIDE 0.9 % IV BOLUS (SEPSIS)
500.0000 mL | Freq: Once | INTRAVENOUS | Status: AC
  Administered 2014-05-02: 500 mL via INTRAVENOUS

## 2014-05-02 MED ORDER — LEVOFLOXACIN IN D5W 750 MG/150ML IV SOLN
750.0000 mg | INTRAVENOUS | Status: DC
  Administered 2014-05-02: 750 mg via INTRAVENOUS
  Filled 2014-05-02: qty 150

## 2014-05-02 MED ORDER — ENSURE COMPLETE PO LIQD
237.0000 mL | Freq: Three times a day (TID) | ORAL | Status: DC
Start: 2014-05-02 — End: 2014-05-06
  Administered 2014-05-02 – 2014-05-06 (×13): 237 mL via ORAL

## 2014-05-02 MED ORDER — COLLAGENASE 250 UNIT/GM EX OINT
TOPICAL_OINTMENT | Freq: Every day | CUTANEOUS | Status: DC
  Administered 2014-05-02 – 2014-05-05 (×4): via TOPICAL
  Administered 2014-05-06: 1 via TOPICAL
  Filled 2014-05-02 (×2): qty 30

## 2014-05-02 MED ORDER — CIPROFLOXACIN IN D5W 400 MG/200ML IV SOLN
400.0000 mg | Freq: Two times a day (BID) | INTRAVENOUS | Status: DC
  Administered 2014-05-02 (×2): 400 mg via INTRAVENOUS
  Filled 2014-05-02 (×3): qty 200

## 2014-05-02 MED ORDER — LEVETIRACETAM IN NACL 1000 MG/100ML IV SOLN
1000.0000 mg | Freq: Two times a day (BID) | INTRAVENOUS | Status: DC
  Filled 2014-05-02 (×2): qty 100

## 2014-05-02 MED ORDER — WHITE PETROLATUM GEL
Status: AC
  Administered 2014-05-02: 0.2
  Filled 2014-05-02: qty 1

## 2014-05-02 MED ORDER — INFLUENZA VAC SPLIT QUAD 0.5 ML IM SUSY
0.5000 mL | PREFILLED_SYRINGE | INTRAMUSCULAR | Status: AC
  Administered 2014-05-03: 0.5 mL via INTRAMUSCULAR
  Filled 2014-05-02: qty 0.5

## 2014-05-02 MED ORDER — LEVETIRACETAM IN NACL 1000 MG/100ML IV SOLN
1000.0000 mg | Freq: Two times a day (BID) | INTRAVENOUS | Status: DC
  Administered 2014-05-02 (×2): 1000 mg via INTRAVENOUS
  Filled 2014-05-02 (×3): qty 100

## 2014-05-02 NOTE — Progress Notes (Signed)
INITIAL NUTRITION ASSESSMENT  DOCUMENTATION CODES Per approved criteria  -Severe malnutrition in the context of chronic illness -Underweight   Pt meets criteria for severe MALNUTRITION in the context of chronic illness as evidenced by 11% weight loss in 6 months and severe depletion of muscle mass.  INTERVENTION:  Ensure Complete PO TID, each supplement provides 350 kcal and 13 grams of protein  NUTRITION DIAGNOSIS: Increased nutrient needs related to multiple wounds as evidenced by estimated protein and calorie needs.   Goal: Intake to meet >90% of estimated nutrition needs.  Monitor:  PO intake, labs, weight trend, wound healing.  Reason for Assessment: MD Consult   59 y.o. female  Admitting Dx: Hypothermia  ASSESSMENT: Patient admitted on 1/5 from her PCP's office for abnormal temperature with weakness for several days. Temperature was 89 upon arrival to the ED.  Patient reports that she has been eating poorly PTA and has lost weight. Likes Ensure supplements.  Nutrition Focused Physical Exam:  Subcutaneous Fat:  Orbital Region: WNL Upper Arm Region: WNL Thoracic and Lumbar Region: NA  Muscle:  Temple Region: mild depletion Clavicle Bone Region: mild depletion Clavicle and Acromion Bone Region: mild depletion Scapular Bone Region: NA Dorsal Hand: WNL Patellar Region: moderate depletion Anterior Thigh Region: severe depletion Posterior Calf Region: NA  Edema: none  Height: Ht Readings from Last 1 Encounters:  05/01/14 5\' 7"  (1.702 m)    Weight: Wt Readings from Last 1 Encounters:  05/01/14 111 lb 15.9 oz (50.8 kg)    Ideal Body Weight: 61.4 kg  % Ideal Body Weight: 83%  Wt Readings from Last 10 Encounters:  05/01/14 111 lb 15.9 oz (50.8 kg)  02/22/14 104 lb 6.4 oz (47.356 kg)  11/02/13 125 lb (56.7 kg)  07/24/13 123 lb 1.6 oz (55.838 kg)  07/14/11 260 lb (117.935 kg)    Usual Body Weight: 125 lb (6 months ago)  % Usual Body Weight:  89%  BMI:  Body mass index is 17.54 kg/(m^2). Underweight  Estimated Nutritional Needs: Kcal: 1550-1750 Protein: 80-90 gm Fluid: 1.8 L  Skin: full thickness wound to thigh, partial thickness abrasions to upper back, unstageable wounds to right buttock and sacrum/coccyx  Diet Order: Diet Heart  EDUCATION NEEDS: -Education not appropriate at this time   Intake/Output Summary (Last 24 hours) at 05/02/14 1014 Last data filed at 05/02/14 0800  Gross per 24 hour  Intake 3468.34 ml  Output   2450 ml  Net 1018.34 ml    Last BM: PTA   Labs:   Recent Labs Lab 05/01/14 1426 05/01/14 2101 05/02/14 0317  NA 142  --  144  K 3.2*  --  3.7  CL 105  --  113*  CO2 31  --  26  BUN 13  --  11  CREATININE 0.53  --  0.70  CALCIUM 9.4  --  8.0*  MG  --  1.5  --   GLUCOSE 129*  --  81    CBG (last 3)   Recent Labs  05/01/14 1519  GLUCAP 148*    Scheduled Meds: . aspirin  325 mg Oral Daily  . atorvastatin  40 mg Oral Q1200  . collagenase   Topical Daily  . feeding supplement (ENSURE COMPLETE)  237 mL Oral BID BM  . [START ON 05/03/2014] Influenza vac split quadrivalent PF  0.5 mL Intramuscular Tomorrow-1000  . iron polysaccharides  150 mg Oral BID  . levETIRAcetam  1,000 mg Oral BID  . levofloxacin (LEVAQUIN) IV  750 mg Intravenous Q24H  . multivitamin with minerals  1 tablet Oral Daily    Continuous Infusions: . sodium chloride Stopped (05/01/14 1644)  . sodium chloride 100 mL/hr at 05/02/14 0551    Past Medical History  Diagnosis Date  . Uterine fibroid   . Gastritis   . Hiatal hernia   . Hemorrhoids   . Anemia   . Stroke 12 1989  . Dyslipidemia   . Hypertension   . Seizure disorder   . Carpal tunnel syndrome   . Depression   . Osteoporosis   . Urinary incontinence   . Iron deficiency anemia 2008    negative EGD and colonoscopy  . Diabetes mellitus   . Seizures     Past Surgical History  Procedure Laterality Date  . Colonoscopy  2008  . Upper  gastrointestinal endoscopy  2008  . Tracheostomy    . Breast surgery  1977  . Tubal ligation    . Cesarean section      x2  . Tracheostomy closure      Molli Barrows, [place], LDN, [place] Pager [phone] After Hours Pager [phone]

## 2014-05-02 NOTE — Progress Notes (Signed)
Utilization review completed.  

## 2014-05-02 NOTE — Progress Notes (Signed)
ANTIBIOTIC CONSULT NOTE - INITIAL  Pharmacy Consult for Levaquin  Indication: UTI  Allergies  Allergen Reactions  . Amoxicillin Itching  . Cephalexin Itching  . Penicillins Itching    Patient Measurements: Height: 5\' 7"  (170.2 cm) Weight: 111 lb 15.9 oz (50.8 kg) IBW/kg (Calculated) : 61.6  Vital Signs: Temp: 98.9 F (37.2 C) (01/06 0115) Temp Source: Rectal (01/06 0115) BP: 82/47 mmHg (01/06 0115) Pulse Rate: 73 (01/06 0115)  Labs:  Recent Labs  05/01/14 1426 05/01/14 1526  WBC  --  8.0  HGB  --  10.7*  PLT  --  PLATELET CLUMPS NOTED ON SMEAR, COUNT APPEARS ADEQUATE  CREATININE 0.53  --      Medical History: Past Medical History  Diagnosis Date  . Uterine fibroid   . Gastritis   . Hiatal hernia   . Hemorrhoids   . Anemia   . Stroke 12 1989  . Dyslipidemia   . Hypertension   . Seizure disorder   . Carpal tunnel syndrome   . Depression   . Osteoporosis   . Urinary incontinence   . Iron deficiency anemia 2008    negative EGD and colonoscopy  . Diabetes mellitus   . Seizures     Assessment: Levaquin for UTI, WBC WNL, renal function good, other labs as above.   Plan:  -Levaquin 750 mg IV q24h -Trend WBC, temp, renal function  -F/U cultures   Narda Bonds 05/02/2014,1:41 AM

## 2014-05-02 NOTE — Progress Notes (Signed)
PROGRESS NOTE  [name] [name] OAC:[id number] DOB: [birth year] DOA: 05/01/2014 PCP: Helane Rima, MD  HPI/Recap of past 4 hours: 59 year old female with reported history of seizure disorder, stroke and hypertension who lives at home and is cared for by her daughter who was sent over to the emergency room from her primary care physician's office after she was noted to be hypothermic. Reportedly patient had been feeling weak for days. It was unclear how the patient and her daughter got to the emergency room, although the reports that they perhaps a taken a bus. Reportedly, patient's temperature in the emergency room was 89 on admission patient. Lab work was surprisingly otherwise unremarkable. Blood cultures were done and patient was sent to stepdown unit on warming blanket.  Overnight, patient blood pressures were quite soft requiring multiple liters of warming fluid for hydration. She also spiked fevers and a UA was positive. Patient started on IV Levaquin. By following day, patient has remained afebrile, however she is somewhat withdrawn. She'll answer occasional questions  Assessment/Plan: Principal Problem:   Septic shock from UTI: Patient was criteria with hypotension, fevers and UTI source not relieved even after fluid resuscitation: Patient continued on warm IV fluids. Hypothermia and bradycardia likely side affect from shock plus cold exposure. Active Problems:   HTN (hypertension): Holding antihypertensives to pressures are better.   History of stroke   Seizure: On Keppra   Protein-calorie malnutrition, severe: We'll ask nutrition to see    Normocytic anemia: Hemoglobin dropped from admission, likely initial lab values were heme concentration. She may have had a bleed earlier. Her hemoglobin 2 months prior was normal. We'll continue to follow, Hemoccult stools   Elevated transaminase level: Likely from shock and hypotension, improving   Encephalopathy: Unclear baseline. Patient is  withdrawn, which will answer some questions such as she is not hurting. We'll need to discuss with her daughter Decubitus ulcers: Patient is to have several large acute as ulcers: Seen by wound care.  Code Status: Full code  Family Communication: Left message with daughter  Disposition Plan: Continue step down to blood pressure stabilized  Consultants:  None  Procedures:  Possible volume overload, checking echocardiogram  Antibiotics:  IV Levaquin 1 dose 1/6  Change to IV Cipro 1/6-present   Objective: BP 93/47 mmHg  Pulse 83  Temp(Src) 98.7 F (37.1 C) (Oral)  Resp 11  Ht 5\' 7"  (1.702 m)  Wt 50.8 kg (111 lb 15.9 oz)  BMI 17.54 kg/m2  SpO2 99%  LMP 06/24/2013  Intake/Output Summary (Last 24 hours) at 05/02/14 1419 Last data filed at 05/02/14 1300  Gross per 24 hour  Intake 4543.34 ml  Output   2450 ml  Net 2093.34 ml   Filed Weights   05/01/14 1405 05/01/14 2030  Weight: 47.174 kg (104 lb) 50.8 kg (111 lb 15.9 oz)    Exam:   General:  Awake, minimally interactive  Cardiovascular: Regular rate and rhythm, S1-S2  Respiratory: Clear to auscultation bilaterally  Abdomen: Soft, obese, nontender, positive bowel sounds  Musculoskeletal: No clubbing or cyanosis, trace edema   Data Reviewed: Basic Metabolic Panel:  Recent Labs Lab 05/01/14 1426 05/01/14 2101 05/02/14 0317  NA 142  --  144  K 3.2*  --  3.7  CL 105  --  113*  CO2 31  --  26  GLUCOSE 129*  --  81  BUN 13  --  11  CREATININE 0.53  --  0.70  CALCIUM 9.4  --  8.0*  MG  --  1.5  --    Liver Function Tests:  Recent Labs Lab 05/01/14 1426 05/02/14 0317  AST 100* 69*  ALT 122* 85*  ALKPHOS 124* 88  BILITOT 0.4 0.1*  PROT 6.9 4.9*  ALBUMIN 3.0* 2.1*   No results for input(s): LIPASE, AMYLASE in the last 168 hours. No results for input(s): AMMONIA in the last 168 hours. CBC:  Recent Labs Lab 05/01/14 1526 05/02/14 0317  WBC 8.0 4.8  NEUTROABS 6.7  --   HGB 10.7* 8.4*    HCT 31.8* 24.2*  MCV 93.0 94.9  PLT PLATELET CLUMPS NOTED ON SMEAR, COUNT APPEARS ADEQUATE PLATELET CLUMPS NOTED ON SMEAR, UNABLE TO ESTIMATE   Cardiac Enzymes:    Recent Labs Lab 05/02/14 1127  CKTOTAL 124   BNP (last 3 results) No results for input(s): PROBNP in the last 8760 hours. CBG:  Recent Labs Lab 05/01/14 1519  GLUCAP 148*    Recent Results (from the past 240 hour(s))  Blood culture (routine x 2)     Status: None (Preliminary result)   Collection Time: 05/01/14  2:25 PM  Result Value Ref Range Status   Specimen Description BLOOD LEFT ARM  Final   Special Requests BOTTLES DRAWN AEROBIC AND ANAEROBIC 10ML  Final   Culture   Final           BLOOD CULTURE RECEIVED NO GROWTH TO DATE CULTURE WILL BE HELD FOR 5 DAYS BEFORE ISSUING A FINAL NEGATIVE REPORT Performed at [facility]    Report Status PENDING  Incomplete  MRSA PCR Screening     Status: None   Collection Time: 05/01/14  9:30 PM  Result Value Ref Range Status   MRSA by PCR NEGATIVE NEGATIVE Final    Comment:        The GeneXpert MRSA Assay (FDA approved for NASAL specimens only), is one component of a comprehensive MRSA colonization surveillance program. It is not intended to diagnose MRSA infection nor to guide or monitor treatment for MRSA infections.      Studies: Dg Chest Port 1 View  05/01/2014   CLINICAL DATA:  Respiratory distress, history hypertension, stroke, seizure disorder, diabetes  EXAM: PORTABLE CHEST - 1 VIEW  COMPARISON:  Portable exam 1478 hr compared to 02/22/2014  FINDINGS: Normal heart size, mediastinal contours, and pulmonary vascularity.  Skin folds project over RIGHT chest.  Lungs mildly hyperinflated but clear.  No infiltrate, pleural effusion or pneumothorax.  Bones demineralized with suspect BILATERAL chronic rotator cuff tears.  IMPRESSION: Hyperinflated lungs without acute infiltrate.   Electronically Signed   By: Lavonia Dana M.D.   On: 05/01/2014 16:52     Scheduled Meds: . aspirin  325 mg Oral Daily  . atorvastatin  40 mg Oral Q1200  . ciprofloxacin  400 mg Intravenous Q12H  . collagenase   Topical Daily  . feeding supplement (ENSURE COMPLETE)  237 mL Oral TID BM  . [START ON 05/03/2014] Influenza vac split quadrivalent PF  0.5 mL Intramuscular Tomorrow-1000  . iron polysaccharides  150 mg Oral BID  . levETIRAcetam  1,000 mg Intravenous Q12H  . multivitamin with minerals  1 tablet Oral Daily  . white petrolatum        Continuous Infusions: . sodium chloride 125 mL/hr at 05/02/14 1100     Time spent: 35 minutes  [facility] Pager [phone]. If 7PM-7AM, please contact night-coverage at [email or web address], password [facility] 05/02/2014, 2:19 PM  LOS: 1 day

## 2014-05-02 NOTE — Progress Notes (Signed)
Patient BP fell to 85/44. [facility] paged. Lamar Blinks, NP responded. New orders given. Will give patient a 500 cc bolus. Will continue to monitor patient.

## 2014-05-02 NOTE — Progress Notes (Signed)
Pt SBP in the 80s. [facility] paged. Lamar Blinks, NP responded. Ordered a 500cc bolus.  After this bolus pt has received 3L on the floor, and 2L in the ED for a total of 5L overnight. Pt is alert and able to follow commands.  Will continue to monitor.

## 2014-05-02 NOTE — Progress Notes (Signed)
Pt SBP continuously in the 80s. [facility] paged. Lamar Blinks, NP responded. New orders given. Will give patient 500cc bolus. This will bring total amount to 2L since arrival to floor. Pt received 2L in ED as well.   E-Link video conferenced into room. Pt discussed and will continue to be monitored.  Will continue to monitor patient.

## 2014-05-02 NOTE — Progress Notes (Signed)
  Echocardiogram 2D Echocardiogram has been performed.  Linnet Bottari FRANCES 05/02/2014, 3:54 PM

## 2014-05-02 NOTE — H&P (Signed)
No warming blankets or bare hugger available in hospital per night RN, pt on fluid warmer, with positive temp, MD called to update pt progress during night, nursing will cont to monitor

## 2014-05-02 NOTE — Consult Note (Addendum)
WOC wound consult note Reason for Consult: Consult requested for several wounds.  Pt is obtunded and not able to answer questions or discuss plan of care.   Wound type: Right posterior thigh full thickness wound; .5X.5X.1cm, pink and moist, small amt yellow drainage, no odor. Right upper back with partial thickness abrasion; 3X1X.1cm in irregular shape, red and moist, small amt yellow drainage, no odor Pressure Ulcer POA: Yes Measurement: Right buttock unstageable wound; 2.5X1cm; 50% red, 50% yellow slough, small amt yellow drainage, no odor. Sacrum/coccyx unstageable wound; 6X3.5cm;  50% red, 50% yellow slough, small amt yellow drainage, slight strong odor. Dressing procedure/placement/frequency: Foam dressings to right posterior thigh and right upper back to protect and promote healing.  Santyl ointment to chemically debride nonviable tissue to sacrum/coccyx and right buttock. Please re-consult if further assistance is needed.  Thank-you,  Julien Girt MSN, [place], [place], [place], [place]

## 2014-05-03 DIAGNOSIS — A419 Sepsis, unspecified organism: Secondary | ICD-10-CM | POA: Diagnosis not present

## 2014-05-03 DIAGNOSIS — I10 Essential (primary) hypertension: Secondary | ICD-10-CM

## 2014-05-03 LAB — COMPREHENSIVE METABOLIC PANEL
ALBUMIN: 1.8 g/dL — AB (ref 3.5–5.2)
ALT: 59 U/L — AB (ref 0–35)
ANION GAP: 3 — AB (ref 5–15)
AST: 47 U/L — ABNORMAL HIGH (ref 0–37)
Alkaline Phosphatase: 72 U/L (ref 39–117)
BUN: 10 mg/dL (ref 6–23)
CO2: 26 mmol/L (ref 19–32)
Calcium: 8.1 mg/dL — ABNORMAL LOW (ref 8.4–10.5)
Chloride: 112 mEq/L (ref 96–112)
Creatinine, Ser: 0.62 mg/dL (ref 0.50–1.10)
GFR calc Af Amer: >90 mL/min (ref >90)
GFR calc non Af Amer: >90 mL/min (ref >90)
Glucose, Bld: 86 mg/dL (ref 70–99)
POTASSIUM: 3.8 mmol/L (ref 3.5–5.1)
SODIUM: 141 mmol/L (ref 135–145)
Total Bilirubin: 0.2 mg/dL — ABNORMAL LOW (ref 0.3–1.2)
Total Protein: 4.5 g/dL — ABNORMAL LOW (ref 6.0–8.3)

## 2014-05-03 LAB — PROCALCITONIN

## 2014-05-03 LAB — CBC
HCT: 22.7 % — ABNORMAL LOW (ref 36.0–46.0)
Hemoglobin: 7.7 g/dL — ABNORMAL LOW (ref 12.0–15.0)
MCH: 32.4 pg (ref 26.0–34.0)
MCHC: 33.9 g/dL (ref 30.0–36.0)
MCV: 95.4 fL (ref 78.0–100.0)
PLATELETS: DECREASED 10*3/uL (ref 150–400)
RBC: 2.38 MIL/uL — AB (ref 3.87–5.11)
RDW: 15.4 % (ref 11.5–15.5)
WBC: 6.1 10*3/uL (ref 4.0–10.5)

## 2014-05-03 MED ORDER — CEFTRIAXONE SODIUM IN DEXTROSE 20 MG/ML IV SOLN
1.0000 g | INTRAVENOUS | Status: DC
  Administered 2014-05-03 – 2014-05-06 (×4): 1 g via INTRAVENOUS
  Filled 2014-05-03 (×4): qty 50

## 2014-05-03 MED ORDER — LEVETIRACETAM 500 MG PO TABS
1000.0000 mg | ORAL_TABLET | Freq: Two times a day (BID) | ORAL | Status: DC
  Administered 2014-05-03 – 2014-05-06 (×7): 1000 mg via ORAL
  Filled 2014-05-03 (×8): qty 2

## 2014-05-03 MED ORDER — CIPROFLOXACIN HCL 500 MG PO TABS
500.0000 mg | ORAL_TABLET | Freq: Two times a day (BID) | ORAL | Status: DC
  Administered 2014-05-03: 500 mg via ORAL
  Filled 2014-05-03 (×3): qty 1

## 2014-05-03 NOTE — Progress Notes (Signed)
Patient to transfer to [phone] report given to receiving nurse Jonni Sanger all questions answered at this time.  Patient stable at transfer.

## 2014-05-03 NOTE — Clinical Social Work Psychosocial (Signed)
Clinical Social Work Department BRIEF PSYCHOSOCIAL ASSESSMENT 05/03/2014  Patient:  [name] [name],[name] [name]     Account Number:  [id number]     Admit date:  05/01/2014  Clinical Social Worker:  Marciano Sequin  Date/Time:  05/03/2014 12:32 PM  Referred by:  RN  Date Referred:  05/03/2014 Referred for  SNF Placement   Other Referral:   Interview type:  Family Other interview type:   Pt is oriented to self and place only. Gardiner Ramus, Daughter [phone]    PSYCHOSOCIAL DATA Living Status:  WITH ADULT CHILDREN Admitted from facility:   Level of care:   Primary support name:  [name] [name],[name] [name]  Primary support relationship to patient:  CHILD, ADULT Degree of support available:   Strong Support    CURRENT CONCERNS Current Concerns  Adjustment to Illness   Other Concerns:    SOCIAL WORK ASSESSMENT / PLAN CSW spoke with the pt's daughter [name] [name]. CSW introduced self and purpose of call. CSW and the pt discussed the clinical team recommendations for rehab. Laruen reported she would like the pt to transition home. [name] [name] reported understanding the pt is total care. Laruen reported that the pt has a hospital bed, hoyer lift, toilet, and wheel chair. [name] [name] reported the pt will need a [place] agency. [name] [name] inquired about the SNF process, which CSW provided details. CSW emailed [name] [name] the SNF list. CSW explained insurance component and its relation to SNF placement.  CSW answered all questions in which the [name] [name] inquired about. CSW provided the pt with contact information for further questions. CSW will contiune to provided support.   Assessment/plan status:  Psychosocial Support/Ongoing Assessment of Needs Other assessment/ plan:   Information/referral to community resources:    PATIENT'S/FAMILY'S RESPONSE TO PLAN OF CARE: [name] [name] understands the clinicals receommendations. [name] [name] reported needing time to consider her options. [name] [name] prefers the pt to transition home.   [place], MSW,  [place]

## 2014-05-03 NOTE — Evaluation (Signed)
Physical Therapy Evaluation Patient Details Name: [name] [name] MRN: [id number] DOB: 1956/01/28 Today's Date: 05/03/2014   History of Present Illness  pt presents with UTI, Septic Shock, Hypothermia, with hx of CVA and Seizures.    Clinical Impression  Pt mobility very limited and requires total A for all aspects of mobility.  No family present to determine baseline level of function or if family is capable of providing total care for pt.  At this time pt will need SNF level of care.  If family decides they can provide total care at home, then pt will need hospital bed, hoyer lift, wheel chair, [place], [place], and HHSW.  Will trial PT while on acute.      Follow Up Recommendations SNF    Equipment Recommendations  None recommended by PT    Recommendations for Other Services       Precautions / Restrictions Precautions Precautions: Fall Restrictions Weight Bearing Restrictions: No      Mobility  Bed Mobility Overal bed mobility: Needs Assistance Bed Mobility: Supine to Sit;Sit to Supine     Supine to sit: Total assist;HOB elevated Sit to supine: Total assist   General bed mobility comments: pt with minimal A performing bed mobility.  pt uses R UE on bed rail once brought to semi-sitting position.  No A with returning to supine.    Transfers                    Ambulation/Gait                Stairs            Wheelchair Mobility    Modified Rankin (Stroke Patients Only)       Balance Overall balance assessment: Needs assistance Sitting-balance support: Single extremity supported;Feet supported Sitting balance-Leahy Scale: Poor Sitting balance - Comments: pt attempts to use R UE to A with maintaining balance.   Postural control: Posterior lean                                   Pertinent Vitals/Pain Pain Assessment: No/denies pain    Home Living Family/patient expects to be discharged to:: Unsure Living Arrangements:  Children Available Help at Discharge: Family;Available 24 hours/day             Additional Comments: Unclear home environment as no family present.  Unsure if daughter lives with her or if she just comes by.      Prior Function           Comments: No family present to determine baseline function, but it appears as though pt was bed bound.       Hand Dominance        Extremity/Trunk Assessment   Upper Extremity Assessment: Defer to OT evaluation           Lower Extremity Assessment: RLE deficits/detail;LLE deficits/detail RLE Deficits / Details: pt very limited PROM 2/2 decreased muscle length.  Muslce atrophy noted throughout LE.  pt strength grossly 1/5.   LLE Deficits / Details: pt very limited ROM 2/2 decreased muscle length.  Muscle atrophy noted throughout LE.  pt stregnth grossly 1/5.    Cervical / Trunk Assessment: Kyphotic  Communication   Communication: No difficulties  Cognition Arousal/Alertness: Awake/alert Behavior During Therapy: WFL for tasks assessed/performed Overall Cognitive Status: No family/caregiver present to determine baseline cognitive functioning  General Comments: Unclear baseline level of cognition, but pt is oriented x3 and is just unsure of why she is in hospital.      General Comments      Exercises        Assessment/Plan    PT Assessment Patient needs continued PT services  PT Diagnosis Generalized weakness   PT Problem List Decreased strength;Decreased range of motion;Decreased activity tolerance;Decreased balance;Decreased mobility;Decreased coordination;Decreased knowledge of use of DME;Decreased cognition;Decreased skin integrity  PT Treatment Interventions Functional mobility training;Therapeutic activities;Therapeutic exercise;Balance training;Patient/family education   PT Goals (Current goals can be found in the Care Plan section) Acute Rehab PT Goals Patient Stated Goal: pt did not state.   PT  Goal Formulation: With patient Time For Goal Achievement: 05/17/14 Potential to Achieve Goals: Fair    Frequency Min 2X/week   Barriers to discharge Other (comment) Unsure level of caregiver support    Co-evaluation               End of Session   Activity Tolerance: Patient limited by fatigue Patient left: in bed;with call bell/phone within reach Nurse Communication: Mobility status;Need for lift equipment         Time: [phone] PT Time Calculation (min) (ACUTE ONLY): 20 min   Charges:   PT Evaluation $Initial PT Evaluation Tier I: 1 Procedure PT Treatments $Therapeutic Activity: 8-22 mins   PT G CodesCatarina [name], [place] 05/03/2014, 10:05 AM
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
## 2014-05-03 NOTE — Progress Notes (Addendum)
PROGRESS NOTE  [name] [name] NKN:[id number] DOB: 08-02-1955 DOA: 05/01/2014 PCP: Helane Rima, MD  HPI/Recap of past 79 hours: 59 year old female with reported history of seizure disorder, stroke and hypertension who lives at home and is cared for by her daughter who was sent over to the emergency room from her primary care physician's office after she was noted to be hypothermic. Reportedly patient had been feeling weak for days. It was unclear how the patient and her daughter got to the emergency room, although the reports that they perhaps a taken a bus. Reportedly, patient's temperature in the emergency room was 89 on admission patient. Lab work was surprisingly otherwise unremarkable. Blood cultures were done and patient was sent to stepdown unit on warming blanket.  The following day, patient still required large amounts of fluid resuscitation. She spiked fevers and UA was positive so she was started on IV Levaquin initially and then changed over to IV Cipro. She initially was quite minimally responsive and very withdrawn.  Today, patient is markedly different. She is quite interactive and pleasant. She is able to fully give me history past and present. Patient with no complaints. Denies any pain. She is alert and oriented 3 in no known acute distress. Cor temp has been at 98 and patient able to have fluids discontinued.  Assessment/Plan: Principal Problem:   Septic shock from UTI: Patient was criteria with hypotension, fevers and UTI source not relieved even after fluid resuscitation: Patient continued on warm IV fluids. Hypothermia and bradycardia likely side affect from shock plus cold exposure.  She is much improved from this. Blood cultures with no growth to date. Urine cultures do not appear to been done, but in review of her labs, she had Cipro resistant Escherichia coli 2 months ago. Have changed her over to IV Rocephin, noting low allergy with itching but no rash. Active Problems:  HTN (hypertension): Holding antihypertensives until pressures are improved   History of stroke   Seizure: On Keppra   Protein-calorie malnutrition, severe: Pt meets criteria for severe MALNUTRITION in the context of chronic illness as evidenced by 11% weight loss in 6 months and severe depletion of muscle mass.  On ensure 3 times a day.    Normocytic anemia: Hemoglobin dropped from admission, likely initial lab values were heme concentration. She may have had a bleed earlier. Her hemoglobin 2 months prior was normal. We'll continue to follow, Hemoccult stools. Hemoglobin dropped again, we'll continue and check tomorrow   Elevated transaminase level: Likely from shock and hypotension, improving   Encephalopathy: Likely secondary to septic shock. Resolved Decubitus ulcers: Patient is to have several large acute as ulcers: Seen by wound care. Patient clarifies that she is wheelchair-bound  Code Status: Full code  Family Communication: Left message with daughter  Disposition Plan: Much improved. Transfer to floor. Possible discharge home in the next 24-48 hours  Consultants:  None  Procedures:  Echocardiogram done 1/6 for mild elevated BNP: Normal, no valvular dysfunction. Good preserved ejection fraction  Antibiotics:  IV Levaquin 1 dose 1/6  Change to IV Cipro 1/6-1/7  IV Rocephin 1/7-present   Objective: BP 119/54 mmHg  Pulse 60  Temp(Src) 98.6 F (37 C) (Oral)  Resp 18  Ht 5\' 7"  (1.702 m)  Wt 50.8 kg (111 lb 15.9 oz)  BMI 17.54 kg/m2  SpO2 100%  LMP 06/24/2013  Intake/Output Summary (Last 24 hours) at 05/03/14 2005 Last data filed at 05/03/14 1059  Gross per 24 hour  Intake 1969.58 ml  Output   2175 ml  Net -205.42 ml   Filed Weights   05/01/14 1405 05/01/14 2030  Weight: 47.174 kg (104 lb) 50.8 kg (111 lb 15.9 oz)    Exam:   General:  Alert and oriented 3, no acute distress  Cardiovascular: Regular rate and rhythm, S1-S2  Respiratory: Clear to  auscultation bilaterally  Abdomen: Soft, obese, nontender, positive bowel sounds  Musculoskeletal: No clubbing or cyanosis, trace edema   Data Reviewed: Basic Metabolic Panel:  Recent Labs Lab 05/01/14 1426 05/01/14 2101 05/02/14 0317 05/03/14 0857  NA 142  --  144 141  K 3.2*  --  3.7 3.8  CL 105  --  113* 112  CO2 31  --  26 26  GLUCOSE 129*  --  81 86  BUN 13  --  11 10  CREATININE 0.53  --  0.70 0.62  CALCIUM 9.4  --  8.0* 8.1*  MG  --  1.5  --   --    Liver Function Tests:  Recent Labs Lab 05/01/14 1426 05/02/14 0317 05/03/14 0857  AST 100* 69* 47*  ALT 122* 85* 59*  ALKPHOS 124* 88 72  BILITOT 0.4 0.1* 0.2*  PROT 6.9 4.9* 4.5*  ALBUMIN 3.0* 2.1* 1.8*   No results for input(s): LIPASE, AMYLASE in the last 168 hours. No results for input(s): AMMONIA in the last 168 hours. CBC:  Recent Labs Lab 05/01/14 1526 05/02/14 0317 05/03/14 0857  WBC 8.0 4.8 6.1  NEUTROABS 6.7  --   --   HGB 10.7* 8.4* 7.7*  HCT 31.8* 24.2* 22.7*  MCV 93.0 94.9 95.4  PLT PLATELET CLUMPS NOTED ON SMEAR, COUNT APPEARS ADEQUATE PLATELET CLUMPS NOTED ON SMEAR, UNABLE TO ESTIMATE PLATELET CLUMPS NOTED ON SMEAR, COUNT APPEARS DECREASED   Cardiac Enzymes:    Recent Labs Lab 05/02/14 1127  CKTOTAL 124   BNP (last 3 results) No results for input(s): PROBNP in the last 8760 hours. CBG:  Recent Labs Lab 05/01/14 1519  GLUCAP 148*    Recent Results (from the past 240 hour(s))  Blood culture (routine x 2)     Status: None (Preliminary result)   Collection Time: 05/01/14  2:25 PM  Result Value Ref Range Status   Specimen Description BLOOD LEFT ARM  Final   Special Requests BOTTLES DRAWN AEROBIC AND ANAEROBIC 10ML  Final   Culture   Final           BLOOD CULTURE RECEIVED NO GROWTH TO DATE CULTURE WILL BE HELD FOR 5 DAYS BEFORE ISSUING A FINAL NEGATIVE REPORT Performed at [facility]    Report Status PENDING  Incomplete  Blood culture (routine x 2)     Status: None  (Preliminary result)   Collection Time: 05/01/14  3:46 PM  Result Value Ref Range Status   Specimen Description BLOOD ARM RIGHT  Final   Special Requests BOTTLES DRAWN AEROBIC AND ANAEROBIC 5CCBLUE 4CCRED  Final   Culture   Final           BLOOD CULTURE RECEIVED NO GROWTH TO DATE CULTURE WILL BE HELD FOR 5 DAYS BEFORE ISSUING A FINAL NEGATIVE REPORT Performed at [facility]    Report Status PENDING  Incomplete  MRSA PCR Screening     Status: None   Collection Time: 05/01/14  9:30 PM  Result Value Ref Range Status   MRSA by PCR NEGATIVE NEGATIVE Final    Comment:        The GeneXpert MRSA  Assay (FDA approved for NASAL specimens only), is one component of a comprehensive MRSA colonization surveillance program. It is not intended to diagnose MRSA infection nor to guide or monitor treatment for MRSA infections.      Studies: Ct Head Wo Contrast  05/02/2014   CLINICAL DATA:  Hypothermia, weakness for several days. History of stroke 1989.  EXAM: CT HEAD WITHOUT CONTRAST  TECHNIQUE: Contiguous axial images were obtained from the base of the skull through the vertex without intravenous contrast.  COMPARISON:  MRI of the brain February 22, 2014 and CT of the head February 22, 2014  FINDINGS: No intraparenchymal hemorrhage, mass effect or midline shift. Mesial LEFT greater than RIGHT frontoparietal cystic encephalomalacia. Mild ex vacuo dilatation LEFT frontal horn of the lateral ventricles, no hydrocephalus. No acute large vascular territory infarct.  Basal cisterns are patent.  Remote mildly depressed LEFT nasal bone fracture. No skull fracture. Mild paranasal sinus mucosal thickening without air-fluid levels. The mastoid air cells are well aerated. Soft tissue within the LEFT greater than RIGHT external auditory canals most consistent with cerumen. Ocular globes and orbital contents are unremarkable.  IMPRESSION: No acute intracranial process.  LEFT greater than RIGHT frontoparietal  encephalomalacia may reflect remote venous infarct or possibly bilateral anterior cerebral artery infarcts.   Electronically Signed   By: Elon Alas   On: 05/02/2014 05:36    Scheduled Meds: . aspirin  325 mg Oral Daily  . atorvastatin  40 mg Oral Q1200  . cefTRIAXone (ROCEPHIN)  IV  1 g Intravenous Q24H  . collagenase   Topical Daily  . feeding supplement (ENSURE COMPLETE)  237 mL Oral TID BM  . iron polysaccharides  150 mg Oral BID  . levETIRAcetam  1,000 mg Oral BID  . multivitamin with minerals  1 tablet Oral Daily    Continuous Infusions:     Time spent: 25 minutes  [facility] Pager [phone]. If 7PM-7AM, please contact night-coverage at [email or web address], password [facility] 05/03/2014, 8:05 PM  LOS: 2 days

## 2014-05-04 LAB — BASIC METABOLIC PANEL
Anion gap: 6 (ref 5–15)
BUN: 13 mg/dL (ref 6–23)
CALCIUM: 8.4 mg/dL (ref 8.4–10.5)
CO2: 27 mmol/L (ref 19–32)
Chloride: 107 mEq/L (ref 96–112)
Creatinine, Ser: 0.6 mg/dL (ref 0.50–1.10)
GFR calc Af Amer: >90 mL/min (ref >90)
GFR calc non Af Amer: >90 mL/min (ref >90)
GLUCOSE: 97 mg/dL (ref 70–99)
Potassium: 3.7 mmol/L (ref 3.5–5.1)
SODIUM: 140 mmol/L (ref 135–145)

## 2014-05-04 LAB — CBC
HCT: 23.5 % — ABNORMAL LOW (ref 36.0–46.0)
HEMOGLOBIN: 8.1 g/dL — AB (ref 12.0–15.0)
MCH: 32.9 pg (ref 26.0–34.0)
MCHC: 34.5 g/dL (ref 30.0–36.0)
MCV: 95.5 fL (ref 78.0–100.0)
RBC: 2.46 MIL/uL — AB (ref 3.87–5.11)
RDW: 15.2 % (ref 11.5–15.5)
WBC: 7.4 10*3/uL (ref 4.0–10.5)

## 2014-05-04 LAB — HEMOGLOBIN A1C
Hgb A1c MFr Bld: 5.9 % — ABNORMAL HIGH (ref <5.7)
Mean Plasma Glucose: 123 mg/dL — ABNORMAL HIGH (ref <117)

## 2014-05-04 NOTE — Progress Notes (Signed)
Occupational Therapy Treatment Patient Details Name: [name] [name] MRN: [id number] DOB: Feb 22, 1956 Today's Date: 05/04/2014    History of present illness [name] [name] is a 59 y.o. female admitted with UTI, Septic shock, hypothermia    OT comments  Focus of session on use of AE for eating and drinking.  Pt and daughter knowledgeable in use.  Contacted SW to give information to daughter about applying for Medicaid for personal care services,  free cell phone for pt's use.   Recommended daughter pursue a new w/c cushion for pressure relief with Huey Romans, MD and [employer].   Follow Up Recommendations  Home health OT    Equipment Recommendations  Wheelchair cushion (measurements OT) (pt is using her old w/c cushion which is a poor fit)    Recommendations for Other Services      Precautions / Restrictions Precautions Precautions: Fall       Mobility Bed Mobility     Rolling: Mod assist         General bed mobility comments: total assist to pull up in bed using trendelenberg  Transfers                      Balance                                   ADL Overall ADL's : Needs assistance/impaired Eating/Feeding: Minimal assistance;Sitting Eating/Feeding Details (indicate cue type and reason): issued foam build up and cup with lid Grooming: Oral care;Set up Grooming Details (indicate cue type and reason): brushed teeth with foam build up on toothbrush                               General ADL Comments: Pt required total assist for bathing and dressing at baseline.  Pt can call [facility] who takes [facility] back and forth to [facility] in her manual w/c.  Pt can propel her w/c by herself.  Pt was unsuccessful with trial use of motorized w/c.      Vision                     Perception     Praxis      Cognition   Behavior During Therapy: [facility] for tasks assessed/performed Overall Cognitive Status: History of cognitive impairments - at  baseline Area of Impairment: Orientation;Memory;Problem solving;Safety/judgement;Awareness Orientation Level: Disoriented to;Situation   Memory: Decreased short-term memory    Safety/Judgement: Decreased awareness of deficits Awareness: Intellectual   General Comments: Pt without accurate report of home equipment and activity level.    Extremity/Trunk Assessment               Exercises     Shoulder Instructions       General Comments      Pertinent Vitals/ Pain       Pain Assessment: No/denies pain  Home Living                                          Prior Functioning/Environment              Frequency Min 2X/week     Progress Toward Goals  OT Goals(current goals can now be found in the care plan section)  Progress towards OT goals:  Progressing toward goals  Acute Rehab OT Goals Patient Stated Goal: pt did not state  Plan Discharge plan needs to be updated    Co-evaluation                 End of Session     Activity Tolerance Patient tolerated treatment well   Patient Left in bed;with call bell/phone within reach;with family/visitor present   Nurse Communication          Time: 5852-7782 OT Time Calculation (min): 45 min  Charges: OT General Charges $OT Visit: 1 Procedure OT Treatments $Self Care/Home Management : 38-52 mins  Malka So 05/04/2014, 12:25 PM [phone]

## 2014-05-04 NOTE — Progress Notes (Signed)
Pt on room air with SpO2 of 97%.  Pt does not require high flow nasal cannula at this time. RT will continue to monitor.

## 2014-05-04 NOTE — Care Management Note (Signed)
    Page 1 of 1   05/06/2014     3:17:49 PM CARE MANAGEMENT NOTE 05/06/2014  Patient:  [name] [name]   Account Number:  [id number]  Date Initiated:  05/02/2014  Documentation initiated by:  [name] [name]  Subjective/Objective Assessment:   Pt admitted with hypothermia     Action/Plan:   PTA pt lived at home with family- PT/OT evals pending- NCM to follow for recommendations   Anticipated DC Date:  05/07/2014   Anticipated DC Plan:  [place]  CM consult      Choice offered to / List presented to:             Status of service:  In process, will continue to follow Medicare Important Message given?  YES (If response is "NO", the following Medicare IM given date fields will be blank) Date Medicare IM given:  05/04/2014 Medicare IM given by:  ROYAL,[name] [name] Date Additional Medicare IM given:   Additional Medicare IM given by:    Discharge Disposition:  [place]  Per UR Regulation:  Reviewed for med. necessity/level of care/duration of stay  If discussed at [place] of Stay Meetings, dates discussed:    Comments:  05/06/14 CM met with pt in room to offer choice of home health agencies.  Pt chooses [facility] to render HHRN/aide.  CM spoke with [name] [name] (daughter) [phone] and [name] [name] requests ambulance transport home if it wiill be covered by insurance.  CM called CSW, [name] [name] and gave her [name] [name]'s number to let her know cost before arranging transport.  CM called referral to [facility] rep, [name] [name] for HHRN/aide.  CM called [facility] and spoke to [place] to arrange for wheelchair and cushion and commode to be delivered to home of pt tomorrow (per pt's daughter's request). Per [name] [name]'s request CM faxed facesheet and orders to [phone].  No other CM needs were communicated.  [name] [name], BSN, [place] [phone].    05/03/14- [place] RN, BSN [phone] PT recommending SNF for rehab- CSW to f/u with pt and family- will continue to  follow 1500 update- [name] Gibbons RN, BSN - [phone]---  spoke with pt's daughter [name] [name] [phone])- regarding d/c plans- SNF vs HH- explained each to daughter- per conversation states that she is leaning on taking pt home with [facility] and not [facility]- daughter explains that pt already has w/c, rw, bsc, hoyer lift, and hospital bed at home- requested list of [facility] agencies for [facility]- list list at pt's bedside for daughter when she comes.  DME is through [place]. will F/u with daughter for final plan would benefit from full [facility] services (RN/PT/OT/aide/CSW) if returns home.

## 2014-05-04 NOTE — Progress Notes (Signed)
PROGRESS NOTE  [name] [name] CBJ:[id number] DOB: 1955-06-18 DOA: 05/01/2014 PCP: Helane Rima, MD  HPI/Recap of past 38 hours: 59 year old female with reported history of seizure disorder, stroke and hypertension who lives at home and is cared for by her daughter who was sent over to the emergency room from her primary care physician's office after she was noted to be hypothermic. Reportedly patient had been feeling weak for days. It was unclear how the patient and her daughter got to the emergency room, although the reports that they perhaps a taken a bus. Reportedly, patient's temperature in the emergency room was 89 on admission patient. Lab work was surprisingly otherwise unremarkable. Blood cultures were done and patient was sent to stepdown unit on warming blanket.  The following day, patient still required large amounts of fluid resuscitation. She spiked fevers and UA was positive so she was started on IV Levaquin initially and then changed over to IV Cipro. She initially was quite minimally responsive and very withdrawn.  The following day, patient markedly improved. Very interactive with no complaints. No acute distress and able to be transferred out of the unit.   Patient continues to do well today. They're concerned about her going home, but daughter does not want her to go to skilled nursing.  Assessment/Plan: Principal Problem:   Septic shock from UTI: Patient was criteria with hypotension, fevers and UTI source not relieved even after fluid resuscitation: Patient continued on warm IV fluids. Hypothermia and bradycardia likely side affect from shock plus cold exposure.  She is much improved from this. Blood cultures with no growth to date. Urine cultures do not appear to been done, but in review of her labs, she had Cipro resistant Escherichia coli 2 months ago. Have changed her over to IV Rocephin, noting low allergy with itching but no rash. Active Problems:   HTN (hypertension):  Holding antihypertensives until pressures are improved   History of stroke   Seizure: On Keppra   Protein-calorie malnutrition, severe: Pt meets criteria for severe MALNUTRITION in the context of chronic illness as evidenced by 11% weight loss in 6 months and severe depletion of muscle mass.  On ensure 3 times a day.    Normocytic anemia: Hemoglobin dropped from admission, likely initial lab values were heme concentration. She may have had a bleed earlier. Her hemoglobin 2 months prior was normal. Hemoglobin is leveled off and staying around 8.1.    Elevated transaminase level: Likely from shock and hypotension, improving   Encephalopathy: Likely secondary to septic shock. Resolved Decubitus ulcers: Patient is to have several large acute as ulcers: Seen by wound care. Patient clarifies that she is wheelchair-bound.    Code Status: Full code  Family Communication: Left message with daughter  Disposition Plan: Likely discharge home tomorrow, with full home health services. Including social work to observe if patient is safe at home  Consultants:  None  Procedures:  Echocardiogram done 1/6 for mild elevated BNP: Normal, no valvular dysfunction. Good preserved ejection fraction  Antibiotics:  IV Levaquin 1 dose 1/6  Change to IV Cipro 1/6-1/7  IV Rocephin 1/7-present   Objective: BP 125/60 mmHg  Pulse 62  Temp(Src) 97.8 F (36.6 C) (Oral)  Resp 18  Ht 5\' 7"  (1.702 m)  Wt 74.435 kg (164 lb 1.6 oz)  BMI 25.70 kg/m2  SpO2 97%  LMP 06/24/2013  Intake/Output Summary (Last 24 hours) at 05/04/14 1814 Last data filed at 05/04/14 1700  Gross per 24 hour  Intake  890 ml  Output   2300 ml  Net  -1410 ml   Filed Weights   05/01/14 1405 05/01/14 2030 05/03/14 2030  Weight: 47.174 kg (104 lb) 50.8 kg (111 lb 15.9 oz) 74.435 kg (164 lb 1.6 oz)    Exam:   General:  Alert and oriented 3, no acute distress  Cardiovascular: Regular rate and rhythm, S1-S2  Respiratory:  Clear to auscultation bilaterally  Abdomen: Soft, obese, nontender, positive bowel sounds  Musculoskeletal: No clubbing or cyanosis, trace edema   Data Reviewed: Basic Metabolic Panel:  Recent Labs Lab 05/01/14 1426 05/01/14 2101 05/02/14 0317 05/03/14 0857 05/04/14 0624  NA 142  --  144 141 140  K 3.2*  --  3.7 3.8 3.7  CL 105  --  113* 112 107  CO2 31  --  26 26 27   GLUCOSE 129*  --  81 86 97  BUN 13  --  11 10 13   CREATININE 0.53  --  0.70 0.62 0.60  CALCIUM 9.4  --  8.0* 8.1* 8.4  MG  --  1.5  --   --   --    Liver Function Tests:  Recent Labs Lab 05/01/14 1426 05/02/14 0317 05/03/14 0857  AST 100* 69* 47*  ALT 122* 85* 59*  ALKPHOS 124* 88 72  BILITOT 0.4 0.1* 0.2*  PROT 6.9 4.9* 4.5*  ALBUMIN 3.0* 2.1* 1.8*   No results for input(s): LIPASE, AMYLASE in the last 168 hours. No results for input(s): AMMONIA in the last 168 hours. CBC:  Recent Labs Lab 05/01/14 1526 05/02/14 0317 05/03/14 0857 05/04/14 0624  WBC 8.0 4.8 6.1 7.4  NEUTROABS 6.7  --   --   --   HGB 10.7* 8.4* 7.7* 8.1*  HCT 31.8* 24.2* 22.7* 23.5*  MCV 93.0 94.9 95.4 95.5  PLT PLATELET CLUMPS NOTED ON SMEAR, COUNT APPEARS ADEQUATE PLATELET CLUMPS NOTED ON SMEAR, UNABLE TO ESTIMATE PLATELET CLUMPS NOTED ON SMEAR, COUNT APPEARS DECREASED PLATELET CLUMPING, SUGGEST RECOLLECTION OF SAMPLE IN CITRATE TUBE.   Cardiac Enzymes:    Recent Labs Lab 05/02/14 1127  CKTOTAL 124   BNP (last 3 results) No results for input(s): PROBNP in the last 8760 hours. CBG:  Recent Labs Lab 05/01/14 1519  GLUCAP 148*    Recent Results (from the past 240 hour(s))  Blood culture (routine x 2)     Status: None (Preliminary result)   Collection Time: 05/01/14  2:25 PM  Result Value Ref Range Status   Specimen Description BLOOD LEFT ARM  Final   Special Requests BOTTLES DRAWN AEROBIC AND ANAEROBIC 10ML  Final   Culture   Final           BLOOD CULTURE RECEIVED NO GROWTH TO DATE CULTURE WILL BE HELD FOR  5 DAYS BEFORE ISSUING A FINAL NEGATIVE REPORT Performed at [facility]    Report Status PENDING  Incomplete  Blood culture (routine x 2)     Status: None (Preliminary result)   Collection Time: 05/01/14  3:46 PM  Result Value Ref Range Status   Specimen Description BLOOD ARM RIGHT  Final   Special Requests BOTTLES DRAWN AEROBIC AND ANAEROBIC 5CCBLUE 4CCRED  Final   Culture   Final           BLOOD CULTURE RECEIVED NO GROWTH TO DATE CULTURE WILL BE HELD FOR 5 DAYS BEFORE ISSUING A FINAL NEGATIVE REPORT Performed at [facility]    Report Status PENDING  Incomplete  MRSA PCR  Screening     Status: None   Collection Time: 05/01/14  9:30 PM  Result Value Ref Range Status   MRSA by PCR NEGATIVE NEGATIVE Final    Comment:        The GeneXpert MRSA Assay (FDA approved for NASAL specimens only), is one component of a comprehensive MRSA colonization surveillance program. It is not intended to diagnose MRSA infection nor to guide or monitor treatment for MRSA infections.      Studies: No results found.  Scheduled Meds: . aspirin  325 mg Oral Daily  . atorvastatin  40 mg Oral Q1200  . cefTRIAXone (ROCEPHIN)  IV  1 g Intravenous Q24H  . collagenase   Topical Daily  . feeding supplement (ENSURE COMPLETE)  237 mL Oral TID BM  . iron polysaccharides  150 mg Oral BID  . levETIRAcetam  1,000 mg Oral BID  . multivitamin with minerals  1 tablet Oral Daily    Continuous Infusions:     Time spent: 15 minutes  [place] [facility] Pager [phone]. If 7PM-7AM, please contact night-coverage at [email or web address], password [facility] 05/04/2014, 6:14 PM  LOS: 3 days

## 2014-05-05 DIAGNOSIS — L89150 Pressure ulcer of sacral region, unstageable: Secondary | ICD-10-CM

## 2014-05-05 LAB — PROCALCITONIN: Procalcitonin: <0.1 ng/mL

## 2014-05-05 MED ORDER — ENSURE COMPLETE PO LIQD: 237.0000 mL | Freq: Three times a day (TID) | ORAL | Status: DC

## 2014-05-05 MED ORDER — COLLAGENASE 250 UNIT/GM EX OINT: TOPICAL_OINTMENT | Freq: Every day | CUTANEOUS | Status: DC

## 2014-05-05 MED ORDER — CEFUROXIME AXETIL 500 MG PO TABS: 500.0000 mg | ORAL_TABLET | Freq: Two times a day (BID) | ORAL | Status: AC

## 2014-05-05 NOTE — Discharge Summary (Signed)
Discharge Summary  [name] [name] TFT:[id number] DOB: 17-Aug-1955  PCP: Helane Rima, MD  Admit date: 05/01/2014 Anticipated Discharge date: 05/06/2014  Time spent: 25 minutes  Recommendations for Outpatient Follow-up:  1. New medication: Ceftin 500 mg by mouth twice a day 5 days 2. Wound care recommendations: Apply Santyl to right buttock and sacral wounds Q day, then cover with moist fluffed gauze, then dry gauze and tape. 3.  patient will go home with new wheelchair with cushion, bed side commode 4. Home health RN to follow up for wound care observation 5. Patient will follow with PCP in the next 2 weeks  Discharge Diagnoses:  Active Hospital Problems   Diagnosis Date Noted  . Septic shock 05/02/2014  . Encephalopathy 05/02/2014  . Hypothermia 05/01/2014  . Bradycardia 05/01/2014  . Normocytic anemia 05/01/2014  . Elevated transaminase level 05/01/2014  . Protein-calorie malnutrition, severe 02/23/2014  . Seizure 06/10/2013  . HTN (hypertension) 06/09/2007  . History of stroke 06/09/2007    Resolved Hospital Problems   Diagnosis Date Noted Date Resolved  No resolved problems to display.    Discharge Condition:improved, being discharged home  Diet recommendation: low sodium heart healthy with Ensure supplement 3 times a day  [facility] Weights   05/01/14 2030 05/03/14 2030 05/04/14 2133  Weight: 50.8 kg (111 lb 15.9 oz) 74.435 kg (164 lb 1.6 oz) 74.027 kg (163 lb 3.2 oz)    History of present illness:  59 year old female with reported history of seizure disorder, stroke and hypertension who lives at home and is cared for by her daughter who was sent over to the emergency room from her primary care physician's office after she was noted to be hypothermic. Reportedly patient had been feeling weak for days. It was unclear how the patient and her daughter got to the emergency room, although the reports that they perhaps a taken a bus. Reportedly, patient's temperature in the  emergency room was 89 on admission patient. Lab work was surprisingly otherwise unremarkable. Blood cultures were done and patient was sent to stepdown unit on warming blanket.   Hospital Course:  Principal Problem:   Septic shock presenting with hypothermia and bradycardia secondary UTI: Patient met criteria with hypotension, fever and UTI source not relieved following fluid resuscitation. Responded well to warming IV fluids. Blood cultures with no growth to date. Urine cultures not done, however previous urine culture done 2 months prior noted Cipro resistant Escherichia coli. Antibiotics narrowed to IV Rocephin. With resolution of white count, patient will be discharged on by mouth Ceftin 5 more days for total of 10 days of therapy Active Problems:   HTN (hypertension): Holding oral antihypertensives until discharge, 6 to start another pressures improved.   History of stroke with paraplegia: Patient is bedbound and uses wheelchair.   Seizure: Resume Keppra    Protein-calorie malnutrition, severe: Patient met criteria for severe malnutrition in the context of chronic illness as evidenced by 11% weight loss in 6 months plus severe depletion a muscle mass. On ensure 3 times a day. We'll continue this upon discharge.  Sacral decubitus ulcer: Seen by wound care who recommended daily Santyl dressing. Home health nurse will follow-up to evaluate. Getting new wheelchair with new cushion.    Normocytic anemia: Interestingly, hemoglobin dropped from admission and has been stable around 8 with fluid resuscitation. However, in review of her labs, hemoglobin around 10.5 baseline. It's possible patient may have had a previous bleed at some point versus worsening of anemia of chronic disease, likely from  sacral decubitus    Elevated transaminase level: Felt to be secondary to shock liver from sepsis, resolved    Encephalopathy: Initial confusion withdrawn, quickly resolved once patient's sepsis  resolved   Procedures:   echocardiogram done 1/6 from mildly elevated BNP notes good ejection fraction and no valvular dysfunction  Consultations: None  Discharge Exam: BP 118/60 mmHg  Pulse 70  Temp(Src) 98.2 F (36.8 C) (Oral)  Resp 18  Ht 5' 7" (1.702 m)  Wt 74.027 kg (163 lb 3.2 oz)  BMI 25.55 kg/m2  SpO2 96%  LMP 06/24/2013  Gen.: Alert and oriented 3, no acute distress Cardiovascular: Regular rate and rhythm, S1-S2 Respiratory: clear to auscultation bilaterally   Discharge Instructions You were cared for by a hospitalist during your hospital stay. If you have any questions about your discharge medications or the care you received while you were in the hospital after you are discharged, you can call the unit and asked to speak with the hospitalist on call if the hospitalist that took care of you is not available. Once you are discharged, your primary care physician will handle any further medical issues. Please note that NO REFILLS for any discharge medications will be authorized once you are discharged, as it is imperative that you return to your primary care physician (or establish a relationship with a primary care physician if you do not have one) for your aftercare needs so that they can reassess your need for medications and monitor your lab values.  Discharge Instructions    Discharge wound care:    Complete by:  As directed   Apply Santyl to right buttock and sacral wounds Q day, then cover with moist fluffed gauze, then dry gauze and tape.            Medication List    TAKE these medications        aspirin 325 MG tablet  Take 325 mg by mouth daily.     atorvastatin 40 MG tablet  Commonly known as:  LIPITOR  Take 40 mg by mouth daily at 12 noon.     cefUROXime 500 MG tablet  Commonly known as:  CEFTIN  Take 1 tablet (500 mg total) by mouth 2 (two) times daily with a meal.  Start taking on:  05/06/2014     collagenase ointment  Commonly known as:   SANTYL  Apply topically daily.     feeding supplement (ENSURE COMPLETE) Liqd  Take 237 mLs by mouth 3 (three) times daily between meals.     iron polysaccharides 150 MG capsule  Commonly known as:  NIFEREX  Take 1 capsule (150 mg total) by mouth 2 (two) times daily.     levETIRAcetam 1000 MG tablet  Commonly known as:  KEPPRA  Take 1,000 mg by mouth 2 (two) times daily.     lisinopril 10 MG tablet  Commonly known as:  PRINIVIL,ZESTRIL  Take 5 mg by mouth daily.     multivitamin with minerals Tabs tablet  Take 1 tablet by mouth daily.       Allergies  Allergen Reactions  . Amoxicillin Itching  . Cephalexin Itching  . Penicillins Itching       Follow-up Information    Follow up with Helane Rima, MD In 2 weeks.   Specialty:  Family Medicine   Contact information:   [place] [id number] [phone]        The results of significant diagnostics from this  hospitalization (including imaging, microbiology, ancillary and laboratory) are listed below for reference.    Significant Diagnostic Studies: Ct Head Wo Contrast  05/02/2014   CLINICAL DATA:  Hypothermia, weakness for several days. History of stroke 1989.  EXAM: CT HEAD WITHOUT CONTRAST  TECHNIQUE: Contiguous axial images were obtained from the base of the skull through the vertex without intravenous contrast.  COMPARISON:  MRI of the brain February 22, 2014 and CT of the head February 22, 2014  FINDINGS: No intraparenchymal hemorrhage, mass effect or midline shift. Mesial LEFT greater than RIGHT frontoparietal cystic encephalomalacia. Mild ex vacuo dilatation LEFT frontal horn of the lateral ventricles, no hydrocephalus. No acute large vascular territory infarct.  Basal cisterns are patent.  Remote mildly depressed LEFT nasal bone fracture. No skull fracture. Mild paranasal sinus mucosal thickening without air-fluid levels. The mastoid air cells are well aerated. Soft tissue within  the LEFT greater than RIGHT external auditory canals most consistent with cerumen. Ocular globes and orbital contents are unremarkable.  IMPRESSION: No acute intracranial process.  LEFT greater than RIGHT frontoparietal encephalomalacia may reflect remote venous infarct or possibly bilateral anterior cerebral artery infarcts.   Electronically Signed   By: Elon Alas   On: 05/02/2014 05:36   Dg Chest Port 1 View  05/01/2014   CLINICAL DATA:  Respiratory distress, history hypertension, stroke, seizure disorder, diabetes  EXAM: PORTABLE CHEST - 1 VIEW  COMPARISON:  Portable exam 6213 hr compared to 02/22/2014  FINDINGS: Normal heart size, mediastinal contours, and pulmonary vascularity.  Skin folds project over RIGHT chest.  Lungs mildly hyperinflated but clear.  No infiltrate, pleural effusion or pneumothorax.  Bones demineralized with suspect BILATERAL chronic rotator cuff tears.  IMPRESSION: Hyperinflated lungs without acute infiltrate.   Electronically Signed   By: Lavonia Dana M.D.   On: 05/01/2014 16:52    Microbiology: Recent Results (from the past 240 hour(s))  Blood culture (routine x 2)     Status: None (Preliminary result)   Collection Time: 05/01/14  2:25 PM  Result Value Ref Range Status   Specimen Description BLOOD LEFT ARM  Final   Special Requests BOTTLES DRAWN AEROBIC AND ANAEROBIC 10ML  Final   Culture   Final           BLOOD CULTURE RECEIVED NO GROWTH TO DATE CULTURE WILL BE HELD FOR 5 DAYS BEFORE ISSUING A FINAL NEGATIVE REPORT Performed at [facility]    Report Status PENDING  Incomplete  Blood culture (routine x 2)     Status: None (Preliminary result)   Collection Time: 05/01/14  3:46 PM  Result Value Ref Range Status   Specimen Description BLOOD ARM RIGHT  Final   Special Requests BOTTLES DRAWN AEROBIC AND ANAEROBIC 5CCBLUE 4CCRED  Final   Culture   Final           BLOOD CULTURE RECEIVED NO GROWTH TO DATE CULTURE WILL BE HELD FOR 5 DAYS BEFORE ISSUING A  FINAL NEGATIVE REPORT Performed at [facility]    Report Status PENDING  Incomplete  MRSA PCR Screening     Status: None   Collection Time: 05/01/14  9:30 PM  Result Value Ref Range Status   MRSA by PCR NEGATIVE NEGATIVE Final    Comment:        The GeneXpert MRSA Assay (FDA approved for NASAL specimens only), is one component of a comprehensive MRSA colonization surveillance program. It is not intended to diagnose MRSA infection nor to guide or monitor treatment  for MRSA infections.      Labs: Basic Metabolic Panel:  Recent Labs Lab 05/01/14 1426 05/01/14 2101 05/02/14 0317 05/03/14 0857 05/04/14 0624  NA 142  --  144 141 140  K 3.2*  --  3.7 3.8 3.7  CL 105  --  113* 112 107  CO2 31  --  _0 GLUCOSE 129*  --  81 86 97  BUN 13  --  _1 CREATININE 0.53  --  0.70 0.62 0.60  CALCIUM 9.4  --  8.0* 8.1* 8.4  MG  --  1.5  --   --   --    Liver Function Tests:  Recent Labs Lab 05/01/14 1426 05/02/14 0317 05/03/14 0857  AST 100* 69* 47*  ALT 122* 85* 59*  ALKPHOS 124* 88 72  BILITOT 0.4 0.1* 0.2*  PROT 6.9 4.9* 4.5*  ALBUMIN 3.0* 2.1* 1.8*   No results for input(s): LIPASE, AMYLASE in the last 168 hours. No results for input(s): AMMONIA in the last 168 hours. CBC:  Recent Labs Lab 05/01/14 1526 05/02/14 0317 05/03/14 0857 05/04/14 0624  WBC 8.0 4.8 6.1 7.4  NEUTROABS 6.7  --   --   --   HGB 10.7* 8.4* 7.7* 8.1*  HCT 31.8* 24.2* 22.7* 23.5*  MCV 93.0 94.9 95.4 95.5  PLT PLATELET CLUMPS NOTED ON SMEAR, COUNT APPEARS ADEQUATE PLATELET CLUMPS NOTED ON SMEAR, UNABLE TO ESTIMATE PLATELET CLUMPS NOTED ON SMEAR, COUNT APPEARS DECREASED PLATELET CLUMPING, SUGGEST RECOLLECTION OF SAMPLE IN CITRATE TUBE.   Cardiac Enzymes:  Recent Labs Lab 05/02/14 1127  CKTOTAL 124   BNP: BNP (last 3 results) No results for input(s): PROBNP in the last 8760 hours. CBG:  Recent Labs Lab 05/01/14 1519  GLUCAP 148*        Signed:  KRISHNAN,SENDIL K  [facility] 05/05/2014, 5:36 PM

## 2014-05-05 NOTE — Progress Notes (Signed)
Pt not wearing high flow [place]

## 2014-05-05 NOTE — Clinical Social Work Note (Signed)
CSW received consult for follow-up. CSW met with patient who was alert and oriented. CSW addressed statements made to OT by patient. Per patient, she is being fed breakfast, lunch, and dinner, but no snacks. CSW provided patient with drawing of food serving sizes and patient was able to verbalize she received the serving size or slightly less. Patient states her daughter's boyfriend does not change her diaper, because she feels "it makes him uncomfortable to change a grown woman's diaper." Patient states she understands what she thinks is his reasoning for not changing her diaper. Patient further denies discussing diaper situation with daughter, because her daughter always changes her diaper when she gets home. Patient states it just makes her feel nasty when her diaper is not changed. Patient states her daughter Gabriel Earing is a monitor for the school system, so she works Mon-Friday. Patient states she feels as if she is being taken care of properly and her needs are being met. Patient further states she feels safe at home. Per patient, she is grateful for [facility] ordering [facility] services because she will at least be able to get her diaper changed and properly cleaned. Patient states she is "comfortable with her current living situation." CSW made RN aware. No further needs. CSW signing off.  [place], [place] Weekend Clinical Social Worker [phone]

## 2014-05-05 NOTE — Discharge Instructions (Signed)
Wound Care Instructions: Apply Santyl to right buttock and sacral wounds Q day, then cover with moist fluffed gauze, then dry gauze and tape.    Pressure Ulcer A pressure ulcer is a sore that has formed from the breakdown of skin and exposure of deeper layers of tissue. It develops in areas of the body where there is unrelieved pressure. Pressure ulcers are usually found over a bony area, such as the shoulder blades, spine, lower back, hips, knees, ankles, and heels. Pressure ulcers vary in severity. Your health care provider may determine the severity (stage) of your pressure ulcer. The stages include:  Stage I--The skin is red, and when the skin is pressed, it stays red.  Stage II--The top layer of skin is gone, and there is a shallow, pink ulcer.  Stage III--The ulcer becomes deeper, and it is more difficult to see the whole wound. Also, there may be yellow or brown parts, as well as pink and red parts.  Stage IV--The ulcer may be deep and red, pink, brown, white, or yellow. Bone or muscle may be seen.  Unstageable pressure ulcer--The ulcer is covered almost completely with black, brown, or yellow tissue. It is not known how deep the ulcer is or what stage it is until this covering comes off.  Suspected deep tissue injury--A person's skin can be injured from pressure or pulling on the skin when his or her position is changed. The skin appears purple or maroon. There may not be an opening in the skin, but there could be a blood-filled blister. This deep tissue injury is often difficult to see in people with darker skin tones. The site may open and become deeper in time. However, early interventions will help the area heal and may prevent the area from opening. CAUSES  Pressure ulcers are caused by pressure against the skin that limits the flow of blood to the skin and nearby tissues. There are many risk factors that can lead to pressure sores. RISK FACTORS  Decreased ability to  move.  Decreased ability to feel pain or discomfort.  Excessive skin moisture from urine, stool, sweat, or secretions.  Poor nutrition.  Dehydration.  Tobacco, drug, or alcohol abuse.  Having someone pull on bedsheets that are under you, such as when health care workers are changing your position in a hospital bed.  Obesity.  Increased adult age.  Hospitalization in a critical care unit for longer than 4 days with use of medical devices.  Prolonged use of medical devices.  Critical illness.  Anemia.  Traumatic brain injury.  Spinal cord injury.  Stroke.  Diabetes.  Poor blood glucose control.  Low blood pressure (hypotension).  Low oxygen levels.  Medicines that reduce blood flow.  Infection. DIAGNOSIS  Your health care provider will diagnose your pressure ulcer based on its appearance. The health care provider may determine the stage of your pressure ulcer as well. Tests may be done to check for infection, to assess your circulation, or to check for other diseases, such as diabetes. TREATMENT  Treatment of your pressure ulcer begins with determining what stage the ulcer is in. Your treatment team may include your health care provider, a wound care specialist, a nutritionist, a physical therapist, and a Psychologist, sport and exercise. Possible treatments may include:   Moving or repositioning every 1-2 hours.  Using beds or mattresses to shift your body weight and pressure points frequently.  Improving your diet.  Cleaning and bandaging (dressing) the open wound.  Giving antibiotic medicines.  Removing  damaged tissue.  Surgery and sometimes skin grafts. HOME CARE INSTRUCTIONS  If you were hospitalized, follow the care plan that was started in the hospital.  Avoid staying in the same position for more than 2 hours. Use padding, devices, or mattresses to cushion your pressure points as directed by your health care provider.  Eat a well-balanced diet. Take nutritional  supplements and vitamins as directed by your health care provider.  Keep all follow-up appointments.  Only take over-the-counter or prescription medicines for pain, fever, or discomfort as directed by your health care provider. SEEK MEDICAL CARE IF:   Your pressure ulcer is not improving.  You do not know how to care for your pressure ulcer.  You notice other areas of redness on your skin.  You have a fever. SEEK IMMEDIATE MEDICAL CARE IF:   You have increasing redness, swelling, or pain in your pressure ulcer.  You notice pus coming from your pressure ulcer.  You notice a bad smell coming from the wound or dressing.  Your pressure ulcer opens up again. Document Released: 04/13/2005 Document Revised: 04/18/2013 Document Reviewed: 12/19/2012 [facility] Patient Information 2015 [place], [place]. This information is not intended to replace advice given to you by your health care provider. Make sure you discuss any questions you have with your health care provider.

## 2014-05-06 NOTE — Clinical Social Work Note (Signed)
CSW made aware patient needs transportation assistance home by [facility]. Per Judson Roch, she has spoke with daughter Mickel Baas who states patient's transportation via EMS is always covered by insurance. CSW prepared transportation packet and verified patient's address with RN Josph Macho). CSW placed transportation packet in patient's shadow chart. CSW to arrange transportation via [place]. No further needs. CSW signing off.  [place], [place] Weekend Clinical Social Worker [phone]

## 2014-05-06 NOTE — Progress Notes (Signed)
Patient dc'd at this time, report given to EMS with no further questions. Patient vital signs WNL, daughter updated and given discharge instructions as well as patient with no further questions.

## 2014-05-06 NOTE — Progress Notes (Signed)
No events overnight. Patient doing well in for discharge today home with her daughter. Home health RN to follow-up for wound care assessment. Setting up home equipment.

## 2014-05-07 LAB — CULTURE, BLOOD (ROUTINE X 2): CULTURE: NO GROWTH

## 2014-05-08 LAB — CULTURE, BLOOD (ROUTINE X 2): Culture: NO GROWTH

## 2014-07-06 ENCOUNTER — Ambulatory Visit: Payer: Medicare HMO | Admitting: Adult Health

## 2014-07-17 ENCOUNTER — Encounter: Payer: Self-pay | Admitting: Adult Health

## 2014-10-22 ENCOUNTER — Other Ambulatory Visit: Payer: Self-pay

## 2014-11-20 ENCOUNTER — Emergency Department (HOSPITAL_COMMUNITY): Payer: Medicare HMO

## 2014-11-20 ENCOUNTER — Inpatient Hospital Stay (HOSPITAL_COMMUNITY)
Admission: EM | Admit: 2014-11-20 | Discharge: 2014-11-25 | DRG: 101 | Disposition: A | Discharge status: Home / Self Care | Payer: Medicare HMO | Attending: Internal Medicine | Admitting: Internal Medicine

## 2014-11-20 ENCOUNTER — Encounter (HOSPITAL_COMMUNITY): Payer: Self-pay | Admitting: *Deleted

## 2014-11-20 ENCOUNTER — Inpatient Hospital Stay (HOSPITAL_COMMUNITY): Payer: Medicare HMO

## 2014-11-20 DIAGNOSIS — E785 Hyperlipidemia, unspecified: Secondary | ICD-10-CM | POA: Diagnosis present

## 2014-11-20 DIAGNOSIS — Z993 Dependence on wheelchair: Secondary | ICD-10-CM

## 2014-11-20 DIAGNOSIS — Z8673 Personal history of transient ischemic attack (TIA), and cerebral infarction without residual deficits: Secondary | ICD-10-CM | POA: Diagnosis not present

## 2014-11-20 DIAGNOSIS — E11649 Type 2 diabetes mellitus with hypoglycemia without coma: Secondary | ICD-10-CM | POA: Diagnosis present

## 2014-11-20 DIAGNOSIS — R079 Chest pain, unspecified: Secondary | ICD-10-CM | POA: Diagnosis present

## 2014-11-20 DIAGNOSIS — E876 Hypokalemia: Secondary | ICD-10-CM | POA: Diagnosis present

## 2014-11-20 DIAGNOSIS — I10 Essential (primary) hypertension: Secondary | ICD-10-CM | POA: Diagnosis present

## 2014-11-20 DIAGNOSIS — D649 Anemia, unspecified: Secondary | ICD-10-CM | POA: Diagnosis present

## 2014-11-20 DIAGNOSIS — J9600 Acute respiratory failure, unspecified whether with hypoxia or hypercapnia: Secondary | ICD-10-CM | POA: Diagnosis not present

## 2014-11-20 DIAGNOSIS — E873 Alkalosis: Secondary | ICD-10-CM | POA: Diagnosis present

## 2014-11-20 DIAGNOSIS — Z880 Allergy status to penicillin: Secondary | ICD-10-CM

## 2014-11-20 DIAGNOSIS — Z881 Allergy status to other antibiotic agents status: Secondary | ICD-10-CM

## 2014-11-20 DIAGNOSIS — D509 Iron deficiency anemia, unspecified: Secondary | ICD-10-CM | POA: Diagnosis present

## 2014-11-20 DIAGNOSIS — R569 Unspecified convulsions: Secondary | ICD-10-CM | POA: Diagnosis present

## 2014-11-20 DIAGNOSIS — Z79899 Other long term (current) drug therapy: Secondary | ICD-10-CM

## 2014-11-20 DIAGNOSIS — M810 Age-related osteoporosis without current pathological fracture: Secondary | ICD-10-CM | POA: Diagnosis present

## 2014-11-20 DIAGNOSIS — G40401 Other generalized epilepsy and epileptic syndromes, not intractable, with status epilepticus: Principal | ICD-10-CM | POA: Diagnosis present

## 2014-11-20 DIAGNOSIS — F329 Major depressive disorder, single episode, unspecified: Secondary | ICD-10-CM | POA: Diagnosis present

## 2014-11-20 DIAGNOSIS — Z7982 Long term (current) use of aspirin: Secondary | ICD-10-CM

## 2014-11-20 DIAGNOSIS — R071 Chest pain on breathing: Secondary | ICD-10-CM | POA: Diagnosis not present

## 2014-11-20 DIAGNOSIS — Z4659 Encounter for fitting and adjustment of other gastrointestinal appliance and device: Secondary | ICD-10-CM | POA: Diagnosis not present

## 2014-11-20 DIAGNOSIS — G40301 Generalized idiopathic epilepsy and epileptic syndromes, not intractable, with status epilepticus: Secondary | ICD-10-CM | POA: Diagnosis not present

## 2014-11-20 DIAGNOSIS — R0789 Other chest pain: Secondary | ICD-10-CM | POA: Diagnosis not present

## 2014-11-20 DIAGNOSIS — G40901 Epilepsy, unspecified, not intractable, with status epilepticus: Secondary | ICD-10-CM | POA: Insufficient documentation

## 2014-11-20 DIAGNOSIS — I209 Angina pectoris, unspecified: Secondary | ICD-10-CM | POA: Diagnosis not present

## 2014-11-20 LAB — BASIC METABOLIC PANEL
Anion gap: 10 (ref 5–15)
BUN: 31 mg/dL — ABNORMAL HIGH (ref 6–20)
CO2: 25 mmol/L (ref 22–32)
CREATININE: 0.72 mg/dL (ref 0.44–1.00)
Calcium: 9.5 mg/dL (ref 8.9–10.3)
Chloride: 108 mmol/L (ref 101–111)
GFR calc Af Amer: >60 mL/min (ref >60)
GFR calc non Af Amer: >60 mL/min (ref >60)
GLUCOSE: 71 mg/dL (ref 65–99)
POTASSIUM: 3.4 mmol/L — AB (ref 3.5–5.1)
Sodium: 143 mmol/L (ref 135–145)

## 2014-11-20 LAB — URINALYSIS, ROUTINE W REFLEX MICROSCOPIC
Bilirubin Urine: NEGATIVE
Glucose, UA: 500 mg/dL — AB
HGB URINE DIPSTICK: NEGATIVE
KETONES UR: NEGATIVE mg/dL
NITRITE: NEGATIVE
Protein, ur: NEGATIVE mg/dL
Specific Gravity, Urine: 1.027 (ref 1.005–1.030)
UROBILINOGEN UA: 0.2 mg/dL (ref 0.0–1.0)
pH: 5 (ref 5.0–8.0)

## 2014-11-20 LAB — CBC WITH DIFFERENTIAL/PLATELET
BASOS ABS: 0 10*3/uL (ref 0.0–0.1)
BASOS PCT: 0 % (ref 0–1)
EOS ABS: 0 10*3/uL (ref 0.0–0.7)
Eosinophils Relative: 0 % (ref 0–5)
HEMATOCRIT: 32.6 % — AB (ref 36.0–46.0)
HEMOGLOBIN: 10.9 g/dL — AB (ref 12.0–15.0)
Lymphocytes Relative: 21 % (ref 12–46)
Lymphs Abs: 1.5 10*3/uL (ref 0.7–4.0)
MCH: 32 pg (ref 26.0–34.0)
MCHC: 33.4 g/dL (ref 30.0–36.0)
MCV: 95.6 fL (ref 78.0–100.0)
MONO ABS: 0.4 10*3/uL (ref 0.1–1.0)
MONOS PCT: 5 % (ref 3–12)
NEUTROS ABS: 5 10*3/uL (ref 1.7–7.7)
NEUTROS PCT: 73 % (ref 43–77)
Platelets: ADEQUATE 10*3/uL (ref 150–400)
RBC: 3.41 MIL/uL — AB (ref 3.87–5.11)
RDW: 15.3 % (ref 11.5–15.5)
WBC: 6.8 10*3/uL (ref 4.0–10.5)

## 2014-11-20 LAB — BLOOD GAS, ARTERIAL
Acid-Base Excess: 3.5 mmol/L — ABNORMAL HIGH (ref 0.0–2.0)
Bicarbonate: 25.3 mEq/L — ABNORMAL HIGH (ref 20.0–24.0)
Drawn by: 235321
FIO2: 1
LHR: 20 {breaths}/min
MECHVT: 400 mL
O2 Saturation: 98.7 %
PEEP: 5 cmH2O
Patient temperature: 98.6
TCO2: 22.9 mmol/L (ref 0–100)
pCO2 arterial: 29.4 mmHg — ABNORMAL LOW (ref 35.0–45.0)
pH, Arterial: 7.545 — ABNORMAL HIGH (ref 7.350–7.450)
pO2, Arterial: 197 mmHg — ABNORMAL HIGH (ref 80.0–100.0)

## 2014-11-20 LAB — TROPONIN I: Troponin I: <0.03 ng/mL (ref <0.031)

## 2014-11-20 LAB — URINE MICROSCOPIC-ADD ON

## 2014-11-20 LAB — CBG MONITORING, ED
GLUCOSE-CAPILLARY: 67 mg/dL (ref 65–99)
Glucose-Capillary: 113 mg/dL — ABNORMAL HIGH (ref 65–99)
Glucose-Capillary: 65 mg/dL (ref 65–99)

## 2014-11-20 LAB — MAGNESIUM: Magnesium: 1.4 mg/dL — ABNORMAL LOW (ref 1.7–2.4)

## 2014-11-20 LAB — PHOSPHORUS: PHOSPHORUS: 2.9 mg/dL (ref 2.5–4.6)

## 2014-11-20 MED ORDER — PROPOFOL 1000 MG/100ML IV EMUL: 5.0000 ug/kg/min | INTRAVENOUS | Status: DC

## 2014-11-20 MED ORDER — FENTANYL CITRATE (PF) 100 MCG/2ML IJ SOLN
INTRAMUSCULAR | Status: AC
  Administered 2014-11-20: 100 ug via INTRAVENOUS
  Filled 2014-11-20: qty 4

## 2014-11-20 MED ORDER — CETYLPYRIDINIUM CHLORIDE 0.05 % MT LIQD
7.0000 mL | Freq: Four times a day (QID) | OROMUCOSAL | Status: DC
  Administered 2014-11-20 – 2014-11-25 (×14): 7 mL via OROMUCOSAL

## 2014-11-20 MED ORDER — POTASSIUM CHLORIDE 10 MEQ/100ML IV SOLN
10.0000 meq | INTRAVENOUS | Status: AC
  Administered 2014-11-20 – 2014-11-21 (×4): 10 meq via INTRAVENOUS
  Filled 2014-11-20 (×4): qty 100

## 2014-11-20 MED ORDER — FENTANYL CITRATE (PF) 100 MCG/2ML IJ SOLN
100.0000 ug | Freq: Once | INTRAMUSCULAR | Status: AC
  Administered 2014-11-20: 100 ug via INTRAVENOUS

## 2014-11-20 MED ORDER — PANTOPRAZOLE SODIUM 40 MG IV SOLR
40.0000 mg | Freq: Every day | INTRAVENOUS | Status: DC
  Administered 2014-11-21 – 2014-11-22 (×3): 40 mg via INTRAVENOUS
  Filled 2014-11-20 (×4): qty 40

## 2014-11-20 MED ORDER — MIDAZOLAM HCL 2 MG/2ML IJ SOLN
INTRAMUSCULAR | Status: AC
  Administered 2014-11-20: 4 mg via INTRAVENOUS
  Filled 2014-11-20: qty 6

## 2014-11-20 MED ORDER — LORAZEPAM 2 MG/ML IJ SOLN
INTRAMUSCULAR | Status: AC
  Administered 2014-11-20: 2 mg
  Filled 2014-11-20: qty 1

## 2014-11-20 MED ORDER — SODIUM CHLORIDE 0.9 % IV SOLN
1500.0000 mg | Freq: Two times a day (BID) | INTRAVENOUS | Status: DC
  Administered 2014-11-21 – 2014-11-23 (×5): 1500 mg via INTRAVENOUS
  Filled 2014-11-20 (×6): qty 15

## 2014-11-20 MED ORDER — MAGNESIUM SULFATE 50 % IJ SOLN
6.0000 g | Freq: Once | INTRAVENOUS | Status: AC
  Administered 2014-11-20: 6 g via INTRAVENOUS
  Filled 2014-11-20: qty 12

## 2014-11-20 MED ORDER — DEXTROSE 50 % IV SOLN: 50.0000 g | Freq: Once | INTRAVENOUS | Status: DC

## 2014-11-20 MED ORDER — SODIUM CHLORIDE 0.9 % IV SOLN: 1500.0000 mg | Freq: Two times a day (BID) | INTRAVENOUS | Status: DC

## 2014-11-20 MED ORDER — CHLORHEXIDINE GLUCONATE 0.12 % MT SOLN
15.0000 mL | Freq: Two times a day (BID) | OROMUCOSAL | Status: DC
  Administered 2014-11-20 – 2014-11-22 (×5): 15 mL via OROMUCOSAL
  Filled 2014-11-20 (×4): qty 15

## 2014-11-20 MED ORDER — ATORVASTATIN CALCIUM 40 MG PO TABS
40.0000 mg | ORAL_TABLET | Freq: Every day | ORAL | Status: DC
  Administered 2014-11-21 – 2014-11-24 (×4): 40 mg
  Filled 2014-11-20 (×4): qty 1

## 2014-11-20 MED ORDER — PROPOFOL 1000 MG/100ML IV EMUL
5.0000 ug/kg/min | INTRAVENOUS | Status: DC
  Administered 2014-11-20: 5 ug/kg/min via INTRAVENOUS

## 2014-11-20 MED ORDER — SODIUM CHLORIDE 0.9 % IV SOLN: 1000.0000 mg | Freq: Once | INTRAVENOUS | Status: DC

## 2014-11-20 MED ORDER — FENTANYL CITRATE (PF) 100 MCG/2ML IJ SOLN
100.0000 ug | INTRAMUSCULAR | Status: DC | PRN
  Administered 2014-11-21: 100 ug via INTRAVENOUS
  Filled 2014-11-20 (×2): qty 2

## 2014-11-20 MED ORDER — DEXTROSE 50 % IV SOLN
25.0000 g | Freq: Once | INTRAVENOUS | Status: AC
  Administered 2014-11-20: 25 g via INTRAVENOUS

## 2014-11-20 MED ORDER — FENTANYL CITRATE (PF) 100 MCG/2ML IJ SOLN
100.0000 ug | INTRAMUSCULAR | Status: DC | PRN
  Administered 2014-11-21 (×3): 100 ug via INTRAVENOUS
  Filled 2014-11-20 (×4): qty 2

## 2014-11-20 MED ORDER — PROPOFOL 1000 MG/100ML IV EMUL
INTRAVENOUS | Status: AC
  Administered 2014-11-20: 5 ug/kg/min via INTRAVENOUS
  Filled 2014-11-20: qty 100

## 2014-11-20 MED ORDER — MIDAZOLAM HCL 2 MG/2ML IJ SOLN
4.0000 mg | Freq: Once | INTRAMUSCULAR | Status: AC
  Administered 2014-11-20: 4 mg via INTRAVENOUS

## 2014-11-20 MED ORDER — ASPIRIN 325 MG PO TABS
325.0000 mg | ORAL_TABLET | Freq: Every day | ORAL | Status: DC
  Administered 2014-11-21 – 2014-11-23 (×2): 325 mg
  Filled 2014-11-20 (×3): qty 1

## 2014-11-20 MED ORDER — SODIUM CHLORIDE 0.9 % IV SOLN
INTRAVENOUS | Status: DC
  Administered 2014-11-20 – 2014-11-21 (×2): via INTRAVENOUS
  Administered 2014-11-22: 500 mL via INTRAVENOUS
  Administered 2014-11-22 – 2014-11-23 (×2): via INTRAVENOUS

## 2014-11-20 MED ORDER — PROPOFOL 1000 MG/100ML IV EMUL
0.0000 ug/kg/min | INTRAVENOUS | Status: DC
  Administered 2014-11-20 – 2014-11-21 (×2): 15 ug/kg/min via INTRAVENOUS
  Filled 2014-11-20 (×2): qty 100

## 2014-11-20 MED ORDER — SODIUM CHLORIDE 0.9 % IV SOLN
1500.0000 mg | Freq: Once | INTRAVENOUS | Status: AC
  Administered 2014-11-20: 1500 mg via INTRAVENOUS
  Filled 2014-11-20: qty 15

## 2014-11-20 MED ORDER — HEPARIN SODIUM (PORCINE) 5000 UNIT/ML IJ SOLN
5000.0000 [IU] | Freq: Three times a day (TID) | INTRAMUSCULAR | Status: DC
  Administered 2014-11-21 – 2014-11-25 (×14): 5000 [IU] via SUBCUTANEOUS
  Filled 2014-11-20 (×18): qty 1

## 2014-11-20 MED ORDER — SODIUM CHLORIDE 0.9 % IV SOLN: 250.0000 mL | INTRAVENOUS | Status: DC | PRN

## 2014-11-20 MED ORDER — DEXTROSE 50 % IV SOLN
INTRAVENOUS | Status: AC
  Filled 2014-11-20: qty 50

## 2014-11-20 NOTE — Consult Note (Signed)
Reason for Consult:Seizures Referring Physician: Thomasene Lot  CC: Seizure  HPI: [name] [name] is an 59 y.o. female with a history of stroke and seizure who is unable to provide any history this evening.  No family is present in the room.  History obtained from the chart.  It appears that the patient had a seizure at her primary care office.  She then seized again on her way into the emergency room. When the ED doctor saw her she was having a focal seizure on the left in the ED. Patient is on Keppra at home.  Unknown compliance.    Past Medical History  Diagnosis Date  . Uterine fibroid   . Gastritis   . Hiatal hernia   . Hemorrhoids   . Anemia   . Stroke 12 1989  . Dyslipidemia   . Hypertension   . Seizure disorder   . Carpal tunnel syndrome   . Depression   . Osteoporosis   . Urinary incontinence   . Iron deficiency anemia 2008    negative EGD and colonoscopy  . Diabetes mellitus   . Seizures     Past Surgical History  Procedure Laterality Date  . Colonoscopy  2008  . Upper gastrointestinal endoscopy  2008  . Tracheostomy    . Breast surgery  1977  . Tubal ligation    . Cesarean section      x2  . Tracheostomy closure      Family History  Problem Relation Age of Onset  . Heart disease Paternal Grandfather   . Hypertension Paternal Grandfather   . Prostate cancer Paternal Uncle   . Ovarian cancer Mother   . Diabetes Cousin   . Colon cancer Neg Hx   . Hypertension Maternal Grandmother   . Stroke Maternal Grandmother   . Diabetes Brother   . Heart attack Brother     Social History:  reports that she has never smoked. She has never used smokeless tobacco. She reports that she does not drink alcohol or use illicit drugs.  Allergies  Allergen Reactions  . Amoxicillin Itching  . Cephalexin Itching  . Penicillins Itching    Medications:  I have reviewed the patient's current medications. Scheduled: . dextrose        ROS: Unable to obtain due to mental  status  Physical Examination: Blood pressure 97/66, pulse 93, temperature 98.3 F (36.8 C), temperature source Axillary, resp. rate 25, last menstrual period 06/24/2013, SpO2 100 %.  HEENT-  Normocephalic, no lesions, without obvious abnormality.  Normal external eye and conjunctiva.  Normal TM's bilaterally.  Normal auditory canals and external ears. Normal external nose, mucus membranes and septum.  Normal pharynx. Cardiovascular- S1, S2 normal, pulses palpable throughout   Lungs- chest clear, no wheezing, rales, normal symmetric air entry Abdomen- soft, non-tender; bowel sounds normal; no masses,  no organomegaly Extremities- no edema Lymph-no adenopathy palpable Musculoskeletal-no joint tenderness, deformity or swelling Skin-warm and dry, no hyperpigmentation, vitiligo, or suspicious lesions  Neurological Examination Mental Status: Patient does not respond to verbal stimuli.  Localizes to some degree with the LUE to deep sternal rub.  Does not follow commands.  No verbalizations noted.  Cranial Nerves: II: patient does not respond confrontation bilaterally, pupils right 3 mm, left 3 mm,and reactive bilaterally III,IV,VI: Patient does not go beyond midline to the right with oculocephalic maneuver V,VII: corneal reflex present bilaterally  VIII: patient does not respond to verbal stimuli IX,X: gag reflex reduced, XI: trapezius strength unable to test bilaterally XII:  tongue strength unable to test Motor: Increased tone on the right.  Minimal spontaneous movement in the left upper and lower extremity.  No movement noted on the right.   Sensory: Does not respond to noxious stimuli in any extremity. Deep Tendon Reflexes:  2+ in the upper extremities and absent in the lower extremities Plantars: upgoing bilaterally Cerebellar: Unable to perform   Laboratory Studies:   Basic Metabolic Panel:  Recent Labs Lab 11/20/14 1753  NA 143  K 3.4*  CL 108  CO2 25  GLUCOSE 71  BUN  31*  CREATININE 0.72  CALCIUM 9.5    Liver Function Tests: No results for input(s): AST, ALT, ALKPHOS, BILITOT, PROT, ALBUMIN in the last 168 hours. No results for input(s): LIPASE, AMYLASE in the last 168 hours. No results for input(s): AMMONIA in the last 168 hours.  CBC:  Recent Labs Lab 11/20/14 1753  WBC 6.8  NEUTROABS 5.0  HGB 10.9*  HCT 32.6*  MCV 95.6  PLT PLATELET CLUMPS NOTED ON SMEAR, COUNT APPEARS ADEQUATE    Cardiac Enzymes:  Recent Labs Lab 11/20/14 1753  TROPONINI <0.03    BNP: Invalid input(s): POCBNP  CBG:  Recent Labs Lab 11/20/14 1731 11/20/14 1825  GLUCAP 26 113*    Microbiology: Results for orders placed or performed during the hospital encounter of 05/01/14  Blood culture (routine x 2)     Status: None   Collection Time: 05/01/14  2:25 PM  Result Value Ref Range Status   Specimen Description BLOOD LEFT ARM  Final   Special Requests BOTTLES DRAWN AEROBIC AND ANAEROBIC 10ML  Final   Culture   Final    NO GROWTH 5 DAYS Performed at [facility]    Report Status 05/07/2014 FINAL  Final  Blood culture (routine x 2)     Status: None   Collection Time: 05/01/14  3:46 PM  Result Value Ref Range Status   Specimen Description BLOOD ARM RIGHT  Final   Special Requests BOTTLES DRAWN AEROBIC AND ANAEROBIC 5CCBLUE 4CCRED  Final   Culture   Final    NO GROWTH 5 DAYS Performed at [facility]    Report Status 05/08/2014 FINAL  Final  MRSA PCR Screening     Status: None   Collection Time: 05/01/14  9:30 PM  Result Value Ref Range Status   MRSA by PCR NEGATIVE NEGATIVE Final    Comment:        The GeneXpert MRSA Assay (FDA approved for NASAL specimens only), is one component of a comprehensive MRSA colonization surveillance program. It is not intended to diagnose MRSA infection nor to guide or monitor treatment for MRSA infections.     Coagulation Studies: No results for input(s): LABPROT, INR in the last 72  hours.  Urinalysis:  Recent Labs Lab 11/20/14 1900  COLORURINE YELLOW  LABSPEC 1.027  PHURINE 5.0  GLUCOSEU 500*  HGBUR NEGATIVE  BILIRUBINUR NEGATIVE  KETONESUR NEGATIVE  PROTEINUR NEGATIVE  UROBILINOGEN 0.2  NITRITE NEGATIVE  LEUKOCYTESUR TRACE*    Lipid Panel:     Component Value Date/Time   CHOL  03/24/2010 0519    115        ATP III CLASSIFICATION:  <200     mg/dL   Desirable  200-239  mg/dL   Borderline High  >=240    mg/dL   High          TRIG 65 03/24/2010 0519   HDL 43 03/24/2010 0519   CHOLHDL 2.7 03/24/2010  0519   VLDL 13 03/24/2010 0519   LDLCALC  03/24/2010 0519    59        Total Cholesterol/HDL:CHD Risk Coronary Heart Disease Risk Table                     Men   Women  1/2 Average Risk   3.4   3.3  Average Risk       5.0   4.4  2 X Average Risk   9.6   7.1  3 X Average Risk  23.4   11.0        Use the calculated Patient Ratio above and the CHD Risk Table to determine the patient's CHD Risk.        ATP III CLASSIFICATION (LDL):  <100     mg/dL   Optimal  100-129  mg/dL   Near or Above                    Optimal  130-159  mg/dL   Borderline  160-189  mg/dL   High  >190     mg/dL   Very High    HgbA1C:  Lab Results  Component Value Date   HGBA1C 5.9* 05/02/2014    Urine Drug Screen:     Component Value Date/Time   LABOPIA NONE DETECTED 02/22/2014 0248   COCAINSCRNUR NONE DETECTED 02/22/2014 0248   LABBENZ NONE DETECTED 02/22/2014 0248   AMPHETMU NONE DETECTED 02/22/2014 0248   THCU NONE DETECTED 02/22/2014 0248   LABBARB NONE DETECTED 02/22/2014 0248    Alcohol Level: No results for input(s): ETH in the last 168 hours.  Other results: EKG: sinus tachycardia at 121 bpm.  Imaging: Ct Head Wo Contrast  11/20/2014   CLINICAL DATA:  59 year old female with seizure today. History of seizure disorder.  EXAM: CT HEAD WITHOUT CONTRAST  TECHNIQUE: Contiguous axial images were obtained from the base of the skull through the vertex without  intravenous contrast.  COMPARISON:  05/02/2014 and prior exams  FINDINGS: Severe bifrontal encephalomalacia again identified.  No acute intracranial abnormalities are identified, including mass lesion or mass effect, hydrocephalus, extra-axial fluid collection, midline shift, hemorrhage, or acute infarction.  The visualized bony calvarium is unremarkable.  IMPRESSION: No evidence of acute intracranial abnormality.  Severe bifrontal encephalomalacia.   Electronically Signed   By: Margarette Canada M.D.   On: 11/20/2014 19:30   Dg Chest Port 1 View  11/20/2014   CLINICAL DATA:  Seizures, unresponsive  EXAM: PORTABLE CHEST - 1 VIEW  COMPARISON:  05/01/2014  FINDINGS: The heart size and mediastinal contours are within normal limits. Both lungs are clear. The visualized skeletal structures are unremarkable.  IMPRESSION: No active disease.   Electronically Signed   By: Skipper Cliche M.D.   On: 11/20/2014 17:58     Assessment/Plan: 59 year old female with a history of stroke and seizures presenting with breakthrough seizures.  Head CT personally reviewed and shows no acute changes.  Patient has not returned to baseline.  On Keppra at home.  Unknown compliance.    Recommendations: 1. Serum magnesium and phosphorus 2. EEG tonight. Patient has not returned to baseline. Technician to be contacted. Patient will require transfer to [facility] for this to be performed. 3. Keppra 1500mg  now. Will continue maintenance of 1500mg  BID. 4. Seizure precautions 5. Ativan prn   Alexis Goodell, MD [facility] [phone] 11/20/2014, 7:41 PM

## 2014-11-20 NOTE — ED Notes (Signed)
Lattie Haw, RN [facility] [place] updated on pt status.

## 2014-11-20 NOTE — ED Provider Notes (Addendum)
CSN: [id number]     Arrival date & time 11/20/14  1715 History   First MD Initiated Contact with Patient 11/20/14 1720     Chief Complaint  Patient presents with  . Seizures     (Consider location/radiation/quality/duration/timing/severity/associated sxs/prior Treatment) Patient is a 59 y.o. female presenting with seizures.  Seizures  Level V caveat altered mental status.  Patient presented with seizure. Reportedly patient had a seizure at her primary care office. And then seized again on her way into the emergency room. When this provider saw her she was having a localized seizure to left upper summary. Patient has history of stroke seizure disorder on Keppra at home.   Past Medical History  Diagnosis Date  . Uterine fibroid   . Gastritis   . Hiatal hernia   . Hemorrhoids   . Anemia   . Stroke 12 1989  . Dyslipidemia   . Hypertension   . Seizure disorder   . Carpal tunnel syndrome   . Depression   . Osteoporosis   . Urinary incontinence   . Iron deficiency anemia 2008    negative EGD and colonoscopy  . Diabetes mellitus   . Seizures    Past Surgical History  Procedure Laterality Date  . Colonoscopy  2008  . Upper gastrointestinal endoscopy  2008  . Tracheostomy    . Breast surgery  1977  . Tubal ligation    . Cesarean section      x2  . Tracheostomy closure     Family History  Problem Relation Age of Onset  . Heart disease Paternal Grandfather   . Hypertension Paternal Grandfather   . Prostate cancer Paternal Uncle   . Ovarian cancer Mother   . Diabetes Cousin   . Colon cancer Neg Hx   . Hypertension Maternal Grandmother   . Stroke Maternal Grandmother   . Diabetes Brother   . Heart attack Brother    History  Substance Use Topics  . Smoking status: Never Smoker   . Smokeless tobacco: Never Used  . Alcohol Use: No   OB History    No data available     Review of Systems  Unable to perform ROS: Mental status change  Neurological: Positive for  seizures.      Allergies  Amoxicillin; Cephalexin; and Penicillins  Home Medications   Prior to Admission medications   Medication Sig Start Date End Date Taking? Authorizing Provider  aspirin 325 MG tablet Take 325 mg by mouth daily.   Yes Historical Provider, MD  atorvastatin (LIPITOR) 40 MG tablet Take 40 mg by mouth daily at 12 noon.    Yes Historical Provider, MD  collagenase (SANTYL) ointment Apply topically daily. 05/05/14  Yes Annita Brod, MD  feeding supplement, ENSURE COMPLETE, (ENSURE COMPLETE) LIQD Take 237 mLs by mouth 3 (three) times daily between meals. 05/05/14  Yes Annita Brod, MD  iron polysaccharides (NIFEREX) 150 MG capsule Take 1 capsule (150 mg total) by mouth 2 (two) times daily. 07/25/13  Yes Ripudeep Krystal Eaton, MD  levETIRAcetam (KEPPRA) 1000 MG tablet Take 1,000 mg by mouth 2 (two) times daily.   Yes Historical Provider, MD  lisinopril (PRINIVIL,ZESTRIL) 10 MG tablet Take 5 mg by mouth daily.    Yes Historical Provider, MD  Multiple Vitamin (MULTIVITAMIN WITH MINERALS) TABS tablet Take 1 tablet by mouth daily. 02/23/14  Yes Hosie Poisson, MD   BP 90/62 mmHg  Pulse 113  Temp(Src) 98.3 F (36.8 C) (Axillary)  Resp 30  SpO2 100%  LMP 06/24/2013 Physical Exam  Constitutional: She appears well-developed and well-nourished.  HENT:  Head: Normocephalic and atraumatic.  Eyes: Conjunctivae are normal. Right eye exhibits no discharge.  Neck: Neck supple.  Cardiovascular: Normal rate, regular rhythm and normal heart sounds.   No murmur heard. Pulmonary/Chest: Effort normal and breath sounds normal. She has no wheezes. She has no rales.  Abdominal: Soft. She exhibits no distension. There is no tenderness.  Musculoskeletal: She exhibits no edema.  Neurological:  Patient postictal.  Skin: Skin is warm and dry. No rash noted. She is not diaphoretic.  Nursing note and vitals reviewed.   ED Course  Procedures (including critical care time) Labs Review Labs  Reviewed  CBC WITH DIFFERENTIAL/PLATELET - Abnormal; Notable for the following:    RBC 3.41 (*)    Hemoglobin 10.9 (*)    HCT 32.6 (*)    All other components within normal limits  BASIC METABOLIC PANEL - Abnormal; Notable for the following:    Potassium 3.4 (*)    BUN 31 (*)    All other components within normal limits  CBG MONITORING, ED - Abnormal; Notable for the following:    Glucose-Capillary 113 (*)    All other components within normal limits  TROPONIN I  URINALYSIS, ROUTINE W REFLEX MICROSCOPIC (NOT AT [facility])  CBG MONITORING, ED    Imaging Review Dg Chest Port 1 View  11/20/2014   CLINICAL DATA:  Seizures, unresponsive  EXAM: PORTABLE CHEST - 1 VIEW  COMPARISON:  05/01/2014  FINDINGS: The heart size and mediastinal contours are within normal limits. Both lungs are clear. The visualized skeletal structures are unremarkable.  IMPRESSION: No active disease.   Electronically Signed   By: Skipper Cliche M.D.   On: 11/20/2014 17:58     EKG Interpretation   Date/Time:  Tuesday November 20 2014 17:24:09 EDT Ventricular Rate:  121 PR Interval:  178 QRS Duration: 86 QT Interval:  320 QTC Calculation: 540 R Axis:   38 Text Interpretation:  Sinus tachycardia Repol abnrm suggests ischemia,  diffuse leads Lateral depression V4, V5 Confirmed by Gerald Leitz  [phone]) on 11/20/2014 6:37:33 PM      MDM   Final diagnoses:  Chest pain    Patient is a 59 year old female with history of seizure disorder presenting today with seizure. Patient had 2 seizures since prior to arrival. She was actively seizing on arrival . Given Ativan. Seizure broke. Will give Keppra load at this time. Once patient is back to baseline we'll ask questions about symptoms of infection.  Had a third seizure here and has not gone to baseline between seizures.  We'll get a CT head to rule out intracranial hemorrhage causing increased seizures.  EMS said that sugar was 325. However when we rechecked it was  104. Will give D50 at this time.  6:42 PM  Patient only received 2 of ativan today. (and keppra loading 1,500 g)  Concerned that patient is more altered than I would expect. CT pending. Concerned post ictal vs status. Neurology called.   7:51 PM  Neuorologist saw patient in ED. They are in agreement that patient needs ongoing EEG to evaluate for status. Patietn still protecting airway, able to cough, localizes.  9:24 PM Seen with critical care doctor, who would like her intubated prior to transfer.    CRITICAL CARE Performed by: Gardiner Sleeper   Total critical care time: 30  Critical care time was exclusive of separately billable procedures and treating other  patients.  Critical care was necessary to treat or prevent imminent or life-threatening deterioration.  Critical care was time spent personally by me on the following activities: development of treatment plan with patient and/or surrogate as well as nursing, discussions with consultants, evaluation of patient's response to treatment, examination of patient, obtaining history from patient or surrogate, ordering and performing treatments and interventions, ordering and review of laboratory studies, ordering and review of radiographic studies, pulse oximetry and re-evaluation of patient's condition.  Concern that patient is in status.    Dayra Rapley Julio Alm, MD 11/20/14 2002  Greyden Besecker Julio Alm, MD 11/20/14 2124

## 2014-11-20 NOTE — ED Notes (Signed)
Pt has personal wheelchair at bedside.

## 2014-11-20 NOTE — ED Notes (Signed)
"  Per EMS, pt was at her PCP's office for routine check up for her DM when she started to have a grand-mal seizure.  Upon arrival to the ED, pt started to have another episode which lasted for 5-6 minutes.  Pt was given ativan 2mg  IVP upon arrival in the ED.

## 2014-11-20 NOTE — H&P (Signed)
PULMONARY / CRITICAL CARE MEDICINE   Name: [name] [name] MRN: [id number] DOB: 02-12-1956    ADMISSION DATE:  11/20/2014 CONSULTATION DATE:  11/20/2014  REFERRING MD :  EDP  CHIEF COMPLAINT:  Seizures  INITIAL PRESENTATION:  59 y.o. F brought to [facility] ED 7/26 with seizures and status epilepticus.  Intubated by DF in ED for GCS ~ 4 and transferred to [facility] for continuous EEG monitoring.   STUDIES:  CT head 7/26 >>> no acute process.  Severe bilateral encephalomalacia. CXR 7/26 >>> no acute process.  SIGNIFICANT EVENTS: 7/26 - admitted.  HISTORY OF PRESENT ILLNESS:  Pt is encephalopathic; therefore, this HPI is obtained from chart review. [name] [name] is a 59 y.o. F with PMH as outlined below.  She apparently was at her PCP office on 11/20/14 when she suddenly had a grand mal seizure.  EMS was dispatched and en route to ED, she seized again. On arrival to ED, she apparently had an additional focal seizure on the left side.  She is on Keppra daily as an outpatient, unknown compliance.  In ED, her K was low at 3.4 and Mg low at 1.4.  She was initially somnolent but arouseable to voice.  By the time of our assessment, she was non-responsive with GCS of 4.  She was subsequently intubated for airway protection.   PAST MEDICAL HISTORY :   has a past medical history of Uterine fibroid; Gastritis; Hiatal hernia; Hemorrhoids; Anemia; Stroke (12 1989); Dyslipidemia; Hypertension; Seizure disorder; Carpal tunnel syndrome; Depression; Osteoporosis; Urinary incontinence; Iron deficiency anemia (2008); Diabetes mellitus; and Seizures.  has past surgical history that includes Colonoscopy (2008); Upper gastrointestinal endoscopy (2008); Tracheostomy; Breast surgery (1977); Tubal ligation; Cesarean section; and Tracheostomy closure. Prior to Admission medications   Medication Sig Start Date End Date Taking? Authorizing Provider  aspirin 325 MG tablet Take 325 mg by mouth daily.   Yes Historical Provider, MD   atorvastatin (LIPITOR) 40 MG tablet Take 40 mg by mouth daily at 12 noon.    Yes Historical Provider, MD  feeding supplement, ENSURE COMPLETE, (ENSURE COMPLETE) LIQD Take 237 mLs by mouth 3 (three) times daily between meals. 05/05/14  Yes Annita Brod, MD  iron polysaccharides (NIFEREX) 150 MG capsule Take 1 capsule (150 mg total) by mouth 2 (two) times daily. 07/25/13  Yes Ripudeep Krystal Eaton, MD  levETIRAcetam (KEPPRA) 1000 MG tablet Take 1,000 mg by mouth 2 (two) times daily.   Yes Historical Provider, MD  lisinopril (PRINIVIL,ZESTRIL) 10 MG tablet Take 5 mg by mouth daily.    Yes Historical Provider, MD  Multiple Vitamin (MULTIVITAMIN WITH MINERALS) TABS tablet Take 1 tablet by mouth daily. 02/23/14  Yes Hosie Poisson, MD  Potassium Gluconate 550 MG TABS Take 550 mg by mouth daily.   Yes Historical Provider, MD   Allergies  Allergen Reactions  . Amoxicillin Itching  . Cephalexin Itching  . Penicillins Itching    FAMILY HISTORY:  Family History  Problem Relation Age of Onset  . Heart disease Paternal Grandfather   . Hypertension Paternal Grandfather   . Prostate cancer Paternal Uncle   . Ovarian cancer Mother   . Diabetes Cousin   . Colon cancer Neg Hx   . Hypertension Maternal Grandmother   . Stroke Maternal Grandmother   . Diabetes Brother   . Heart attack Brother     SOCIAL HISTORY:  reports that she has never smoked. She has never used smokeless tobacco. She reports that she does not drink alcohol or  use illicit drugs.  REVIEW OF SYSTEMS:  Unable to complete as pt is encephalopathic.  SUBJECTIVE: GCS 3-4  VITAL SIGNS: Temp:  [98.3 F (36.8 C)] 98.3 F (36.8 C) (07/26 1722) Pulse Rate:  [70-119] 70 (07/26 2300) Resp:  [13-30] 21 (07/26 2300) BP: (90-159)/(56-92) 118/76 mmHg (07/26 2300) SpO2:  [100 %] 100 % (07/26 2300) FiO2 (%):  [40 %-100 %] 40 % (07/26 2250) Weight:  [73 kg (160 lb 15 oz)] 73 kg (160 lb 15 oz) (07/26 2200) HEMODYNAMICS:   VENTILATOR  SETTINGS: Vent Mode:  [-] PRVC FiO2 (%):  [40 %-100 %] 40 % Set Rate:  [20 bmp] 20 bmp Vt Set:  [400 mL] 400 mL PEEP:  [5 cmH20] 5 cmH20 Plateau Pressure:  [12 cmH20-14 cmH20] 12 cmH20 INTAKE / OUTPUT: Intake/Output    None     PHYSICAL EXAMINATION: General: Chronically ill appearing female, in NAD. Neuro: GCS 4. Non-responsive. HEENT: [place]/AT. PERRL, sclerae anicteric. Cardiovascular: RRR, no M/R/G.  Lungs: Respirations even and unlabored.  Coarse bilaterally. Abdomen: BS x 4, soft, NT/ND.  Musculoskeletal: No gross deformities, no edema.  Skin: Dry / flaking, warm, no rashes.  LABS:  CBC  Recent Labs Lab 11/20/14 1753  WBC 6.8  HGB 10.9*  HCT 32.6*  PLT PLATELET CLUMPS NOTED ON SMEAR, COUNT APPEARS ADEQUATE   Coag's No results for input(s): APTT, INR in the last 168 hours. BMET  Recent Labs Lab 11/20/14 1753  NA 143  K 3.4*  CL 108  CO2 25  BUN 31*  CREATININE 0.72  GLUCOSE 71   Electrolytes  Recent Labs Lab 11/20/14 1753 11/20/14 2000  CALCIUM 9.5  --   MG  --  1.4*  PHOS  --  2.9   Sepsis Markers No results for input(s): LATICACIDVEN, PROCALCITON, O2SATVEN in the last 168 hours. ABG  Recent Labs Lab 11/20/14 2158  PHART 7.545*  PCO2ART 29.4*  PO2ART 197*   Liver Enzymes No results for input(s): AST, ALT, ALKPHOS, BILITOT, ALBUMIN in the last 168 hours. Cardiac Enzymes  Recent Labs Lab 11/20/14 1753  TROPONINI <0.03   Glucose  Recent Labs Lab 11/20/14 1731 11/20/14 1825 11/20/14 2220  GLUCAP 67 113* 65    Imaging Ct Head Wo Contrast  11/20/2014   CLINICAL DATA:  59 year old female with seizure today. History of seizure disorder.  EXAM: CT HEAD WITHOUT CONTRAST  TECHNIQUE: Contiguous axial images were obtained from the base of the skull through the vertex without intravenous contrast.  COMPARISON:  05/02/2014 and prior exams  FINDINGS: Severe bifrontal encephalomalacia again identified.  No acute intracranial abnormalities are  identified, including mass lesion or mass effect, hydrocephalus, extra-axial fluid collection, midline shift, hemorrhage, or acute infarction.  The visualized bony calvarium is unremarkable.  IMPRESSION: No evidence of acute intracranial abnormality.  Severe bifrontal encephalomalacia.   Electronically Signed   By: Margarette Canada M.D.   On: 11/20/2014 19:30   Portable Chest Xray  11/20/2014   CLINICAL DATA:  Seizure, post intubation.  EXAM: PORTABLE CHEST - 1 VIEW  COMPARISON:  Earlier this day at 1743 hour  FINDINGS: The endotracheal tube is 2.6 cm from the carina. The cardiomediastinal contours are normal. The lungs are clear. Pulmonary vasculature is normal. No consolidation, pleural effusion, or pneumothorax. Degenerative change of both shoulders. No acute osseous abnormalities are seen.  IMPRESSION: Endotracheal tube 2.6 cm from the carina.  Clear lungs.   Electronically Signed   By: Jeb Levering M.D.   On: 11/20/2014 22:03  Dg Chest Port 1 View  11/20/2014   CLINICAL DATA:  Seizures, unresponsive  EXAM: PORTABLE CHEST - 1 VIEW  COMPARISON:  05/01/2014  FINDINGS: The heart size and mediastinal contours are within normal limits. Both lungs are clear. The visualized skeletal structures are unremarkable.  IMPRESSION: No active disease.   Electronically Signed   By: Skipper Cliche M.D.   On: 11/20/2014 17:58    ASSESSMENT / PLAN:  NEUROLOGIC A:   Status epilepticus. Acute metabolic encephalopathy. Hx Depression, CVA. P:   Neuro following. Keppra. cEEG. Sedation:  Propofol gtt / Fentanyl PRN. RASS goal: 0 to -1. Daily WUA.  PULMONARY OETT 7/26 >>> A: VDRF due to inability to protect airway in setting of status epilepticus. P:   Full mechanical support, wean as able. VAP bundle. SBT in AM if able. CXR in AM.  CARDIOVASCULAR A:  Hx HTN, HLD. P:  Monitor hemodynamics. Continue ASA, atorvastatin - per tube. Hold outpatient lisinopril.  RENAL A:    Hypokalemia. Hypomagnesemia. P:   Replace K, Mg. NS @ 125. BMP in AM.  GASTROINTESTINAL A:   GI prophylaxis. Nutrition. Hx gastritis. P:   SUP: Pantoprazole. NPO.  HEMATOLOGIC A:   Iron deficiency anemia. VTE Prophylaxis. P:  SCD's / Heparin. CBC in AM.  INFECTIOUS A:   No indication for infection. P:   Monitor clinically.  ENDOCRINE A:   Hx DM - not on meds; now normoglycemic. P:   Monitor glucose on BMP. SSI if glucose consistently > 180.   Family updated: None.  Interdisciplinary Family Meeting v Palliative Care Meeting:  Due by: 11/26/14.   Montey Hora, [place] Pager: [phone]  or [phone] 11/20/2014, 11:24 PM  STAFF NOTE: I, Merrie Roof, MD FACP have personally reviewed patient's available data, including medical history, events of note, physical examination and test results as part of my evaluation. I have discussed with resident/NP and other care providers such as pharmacist, RN and RRT. In addition, I personally evaluated patient and elicited key findings of: now in ed, GCS worse 3-4, she requires intubation now, ad propofol, cEEG per neuro, to vent 8 cc/kg, 100% peep 5, rate 20, abg to follow, no aspiration evident at this stage, correct lytes, K, mag IV and repeat in am , saline for now, ppi, sb q heparin, hold TF for now The patient is critically ill with multiple organ systems failure and requires high complexity decision making for assessment and support, frequent evaluation and titration of therapies, application of advanced monitoring technologies and extensive interpretation of multiple databases.   Critical Care Time devoted to patient care services described in this note is 30 Minutes. This time reflects time of care of this signee: Merrie Roof, MD FACP. This critical care time does not reflect procedure time, or teaching time or supervisory time of PA/NP/Med student/Med Resident  etc but could involve care discussion time. Rest per NP/medical resident whose note is outlined above and that I agree with   Lavon Paganini. Titus Mould, MD, [place] Pgr: [place] [facility] 11/20/2014 11:44 PM

## 2014-11-20 NOTE — ED Notes (Signed)
Bed: RESB Expected date:  Expected time:  Means of arrival:  Comments: Intubation bed 7

## 2014-11-20 NOTE — Procedures (Signed)
Intubation Procedure Note [name] [name] [id number] June 04, 1955  Procedure: Intubation Indications: Airway protection and maintenance  Procedure Details Consent: Unable to obtain consent because of emergent medical necessity. Time Out: Verified patient identification, verified procedure, site/side was marked, verified correct patient position, special equipment/implants available, medications/allergies/relevent history reviewed, required imaging and test results available.  Performed  Maximum sterile technique was used including gloves, gown, hand hygiene and mask.  MAC and 3    Evaluation Hemodynamic Status: BP stable throughout; O2 sats: stable throughout Patient's Current Condition: stable Complications: No apparent complications Patient did tolerate procedure well. Chest X-ray ordered to verify placement.  CXR: pending.   [name] [name]. 11/20/2014  GCS 3

## 2014-11-21 ENCOUNTER — Inpatient Hospital Stay (HOSPITAL_COMMUNITY): Payer: Medicare HMO

## 2014-11-21 DIAGNOSIS — J9600 Acute respiratory failure, unspecified whether with hypoxia or hypercapnia: Secondary | ICD-10-CM | POA: Insufficient documentation

## 2014-11-21 DIAGNOSIS — R0789 Other chest pain: Secondary | ICD-10-CM

## 2014-11-21 LAB — GLUCOSE, CAPILLARY
GLUCOSE-CAPILLARY: 76 mg/dL (ref 65–99)
GLUCOSE-CAPILLARY: 60 mg/dL — AB (ref 65–99)
GLUCOSE-CAPILLARY: 79 mg/dL (ref 65–99)
GLUCOSE-CAPILLARY: 76 mg/dL (ref 65–99)
Glucose-Capillary: 72 mg/dL (ref 65–99)
Glucose-Capillary: 71 mg/dL (ref 65–99)
Glucose-Capillary: 70 mg/dL (ref 65–99)

## 2014-11-21 LAB — BLOOD GAS, ARTERIAL
Acid-Base Excess: 1.6 mmol/L (ref 0.0–2.0)
Bicarbonate: 25.6 mEq/L — ABNORMAL HIGH (ref 20.0–24.0)
Drawn by: 335631
FIO2: 0.4
MECHVT: 450 mL
O2 Saturation: 99.1 %
PATIENT TEMPERATURE: 98.6
PEEP: 5 cmH2O
PO2 ART: 179 mmHg — AB (ref 80.0–100.0)
RATE: 12 resp/min
TCO2: 26.8 mmol/L (ref 0–100)
pCO2 arterial: 39.7 mmHg (ref 35.0–45.0)
pH, Arterial: 7.424 (ref 7.350–7.450)

## 2014-11-21 LAB — BASIC METABOLIC PANEL
Anion gap: 6 (ref 5–15)
BUN: 24 mg/dL — AB (ref 6–20)
CO2: 26 mmol/L (ref 22–32)
CREATININE: 0.55 mg/dL (ref 0.44–1.00)
Calcium: 9.1 mg/dL (ref 8.9–10.3)
Chloride: 110 mmol/L (ref 101–111)
GFR calc Af Amer: >60 mL/min (ref >60)
GFR calc non Af Amer: >60 mL/min (ref >60)
GLUCOSE: 89 mg/dL (ref 65–99)
POTASSIUM: 3.8 mmol/L (ref 3.5–5.1)
SODIUM: 142 mmol/L (ref 135–145)

## 2014-11-21 LAB — CBC
HEMATOCRIT: 29.7 % — AB (ref 36.0–46.0)
Hemoglobin: 10.1 g/dL — ABNORMAL LOW (ref 12.0–15.0)
MCH: 32.1 pg (ref 26.0–34.0)
MCHC: 34 g/dL (ref 30.0–36.0)
MCV: 94.3 fL (ref 78.0–100.0)
Platelets: DECREASED 10*3/uL (ref 150–400)
RBC: 3.15 MIL/uL — ABNORMAL LOW (ref 3.87–5.11)
RDW: 15.2 % (ref 11.5–15.5)
WBC: 8.5 10*3/uL (ref 4.0–10.5)

## 2014-11-21 LAB — MAGNESIUM: MAGNESIUM: 2.6 mg/dL — AB (ref 1.7–2.4)

## 2014-11-21 LAB — PHOSPHORUS: PHOSPHORUS: 2.5 mg/dL (ref 2.5–4.6)

## 2014-11-21 LAB — TRIGLYCERIDES: Triglycerides: 56 mg/dL (ref <150)

## 2014-11-21 LAB — MRSA PCR SCREENING: MRSA by PCR: NEGATIVE

## 2014-11-21 MED ORDER — DEXTROSE 50 % IV SOLN
1.0000 | Freq: Once | INTRAVENOUS | Status: DC
  Filled 2014-11-21: qty 50

## 2014-11-21 MED ORDER — VITAL HIGH PROTEIN PO LIQD
1000.0000 mL | ORAL | Status: DC
  Filled 2014-11-21: qty 1000

## 2014-11-21 MED ORDER — VITAL HIGH PROTEIN PO LIQD
1000.0000 mL | ORAL | Status: DC
  Administered 2014-11-21: 16:00:00
  Administered 2014-11-21: 1000 mL
  Administered 2014-11-21 (×3)
  Administered 2014-11-22: 1000 mL
  Administered 2014-11-22: 08:00:00
  Filled 2014-11-21 (×4): qty 1000

## 2014-11-21 MED ORDER — FENTANYL CITRATE (PF) 100 MCG/2ML IJ SOLN
100.0000 ug | INTRAMUSCULAR | Status: DC | PRN
  Administered 2014-11-21 – 2014-11-22 (×7): 100 ug via INTRAVENOUS
  Filled 2014-11-21 (×5): qty 2

## 2014-11-21 MED ORDER — VITAL HIGH PROTEIN PO LIQD
1000.0000 mL | ORAL | Status: DC
  Filled 2014-11-21 (×3): qty 1000

## 2014-11-21 NOTE — Procedures (Signed)
ELECTROENCEPHALOGRAM REPORT  Patient: [name] [name]       Room #: 3M10 EEG No. ID: [id number] Age: 59 y.o.        Sex: female Referring Physician: Titus Mould, D Report Date:  11/21/2014        Interpreting Physician: Anthony Sar  History: [name] is an 59 y.o. female with a history of stroke, hypertension, diabetes mellitus and seizure disorder who presented following multiple generalized seizures. She was intubated in the emergency room. She remained unresponsive after clinical seizure activity stopped.  Indications for study:  Rule out subclinical status epilepticus.  Technique: This is an 18 channel routine scalp EEG performed at the bedside with bipolar and monopolar montages arranged in accordance to the international 10/20 system of electrode placement.   Description: Patient was intubated and on sedation with propofol at the time of this study. She was minimally responsive to external stimuli. Predominant background activity consisted of moderate amplitude diffuse irregular delta activity with superimposed diffuse moderate amplitude theta activity. Occasional runs of low amplitude beta activity recorded from the frontal and central regions. Photic stimulation was not performed. No epileptiform discharges were recorded.  Interpretation: This EEG was abnormal with moderately severe continuous unspecific slowing of cerebral activity, consistent with sedation with propofol. However, this pattern of slowing can also be seen with metabolic and toxic encephalopathies as well as with postictal states. No evidence of seizure activity was recorded during this study.   Rush Farmer M.D. [facility] [phone]

## 2014-11-21 NOTE — H&P (Signed)
PULMONARY / CRITICAL CARE MEDICINE   Name: [name] [name] MRN: [id number] DOB: 1955-08-04    ADMISSION DATE:  11/20/2014 CONSULTATION DATE:  11/21/2014  REFERRING MD :  EDP  CHIEF COMPLAINT:  Seizures  INITIAL PRESENTATION:  59 y.o. F brought to [facility] ED 7/26 with seizures and status epilepticus.  Intubated by DF in ED for GCS ~ 4 and transferred to [facility] for continuous EEG monitoring.   STUDIES:  CT head 7/26 >>> no acute process.  Severe bilateral encephalomalacia. CXR 7/26 >>> no acute process.  SIGNIFICANT EVENTS: 7/26 - admitted.  SUBJECTIVE: got eeg, remains on prop   VITAL SIGNS: Temp:  [94.7 F (34.8 C)-98.4 F (36.9 C)] 98.4 F (36.9 C) (07/27 0400) Pulse Rate:  [67-119] 82 (07/27 0600) Resp:  [12-30] 20 (07/27 0600) BP: (90-159)/(56-92) 104/63 mmHg (07/27 0600) SpO2:  [99 %-100 %] 100 % (07/27 0600) FiO2 (%):  [40 %-100 %] 40 % (07/27 0600) Weight:  [47.5 kg (104 lb 11.5 oz)-73 kg (160 lb 15 oz)] 47.5 kg (104 lb 11.5 oz) (07/27 0237) HEMODYNAMICS:   VENTILATOR SETTINGS: Vent Mode:  [-] PRVC FiO2 (%):  [40 %-100 %] 40 % Set Rate:  [20 bmp] 20 bmp Vt Set:  [400 mL-450 mL] 450 mL PEEP:  [5 cmH20] 5 cmH20 Plateau Pressure:  [12 cmH20-17 cmH20] 17 cmH20 INTAKE / OUTPUT: Intake/Output      07/26 0701 - 07/27 0700   I.V. (mL/kg) 946.7 (19.9)   IV Piggyback 400   Total Intake(mL/kg) 1346.7 (28.4)   Urine (mL/kg/hr) 550   Total Output 550   Net +796.7         PHYSICAL EXAMINATION: General: Chronically ill appearing female, in NAD. Neuro: rass -3, no follow commands, WD lowers to pain HEENT: /AT. PERRL 3 mm Cardiovascular: RRR, no M/R/G.  Lungs: CTA, increased oral secretions Abdomen: BS x 4, soft, NT/ND.  Musculoskeletal: no edema.  Skin: Dry / flaking, warm, no rashes.  LABS:  CBC  Recent Labs Lab 11/20/14 1753 11/21/14 0016  WBC 6.8 8.5  HGB 10.9* 10.1*  HCT 32.6* 29.7*  PLT PLATELET CLUMPS NOTED ON SMEAR, COUNT APPEARS ADEQUATE PLATELET CLUMPS  NOTED ON SMEAR, COUNT APPEARS DECREASED   Coag's No results for input(s): APTT, INR in the last 168 hours. BMET  Recent Labs Lab 11/20/14 1753 11/21/14 0016  NA 143 142  K 3.4* 3.8  CL 108 110  CO2 25 26  BUN 31* 24*  CREATININE 0.72 0.55  GLUCOSE 71 89   Electrolytes  Recent Labs Lab 11/20/14 1753 11/20/14 2000 11/21/14 0016  CALCIUM 9.5  --  9.1  MG  --  1.4* 2.6*  PHOS  --  2.9 2.5   Sepsis Markers No results for input(s): LATICACIDVEN, PROCALCITON, O2SATVEN in the last 168 hours. ABG  Recent Labs Lab 11/20/14 2158  PHART 7.545*  PCO2ART 29.4*  PO2ART 197*   Liver Enzymes No results for input(s): AST, ALT, ALKPHOS, BILITOT, ALBUMIN in the last 168 hours. Cardiac Enzymes  Recent Labs Lab 11/20/14 1753  TROPONINI <0.03   Glucose  Recent Labs Lab 11/20/14 1731 11/20/14 1825 11/20/14 2220 11/20/14 2328 11/21/14 0415  GLUCAP 67 113* 65 76 72    Imaging Ct Head Wo Contrast  11/20/2014   CLINICAL DATA:  60 year old female with seizure today. History of seizure disorder.  EXAM: CT HEAD WITHOUT CONTRAST  TECHNIQUE: Contiguous axial images were obtained from the base of the skull through the vertex without intravenous contrast.  COMPARISON:  05/02/2014 and prior exams  FINDINGS: Severe bifrontal encephalomalacia again identified.  No acute intracranial abnormalities are identified, including mass lesion or mass effect, hydrocephalus, extra-axial fluid collection, midline shift, hemorrhage, or acute infarction.  The visualized bony calvarium is unremarkable.  IMPRESSION: No evidence of acute intracranial abnormality.  Severe bifrontal encephalomalacia.   Electronically Signed   By: Margarette Canada M.D.   On: 11/20/2014 19:30   Portable Chest Xray  11/20/2014   CLINICAL DATA:  Seizure, post intubation.  EXAM: PORTABLE CHEST - 1 VIEW  COMPARISON:  Earlier this day at 1743 hour  FINDINGS: The endotracheal tube is 2.6 cm from the carina. The cardiomediastinal  contours are normal. The lungs are clear. Pulmonary vasculature is normal. No consolidation, pleural effusion, or pneumothorax. Degenerative change of both shoulders. No acute osseous abnormalities are seen.  IMPRESSION: Endotracheal tube 2.6 cm from the carina.  Clear lungs.   Electronically Signed   By: Jeb Levering M.D.   On: 11/20/2014 22:03   Dg Chest Port 1 View  11/20/2014   CLINICAL DATA:  Seizures, unresponsive  EXAM: PORTABLE CHEST - 1 VIEW  COMPARISON:  05/01/2014  FINDINGS: The heart size and mediastinal contours are within normal limits. Both lungs are clear. The visualized skeletal structures are unremarkable.  IMPRESSION: No active disease.   Electronically Signed   By: Skipper Cliche M.D.   On: 11/20/2014 17:58   Dg Abd Portable 1v  11/21/2014   CLINICAL DATA:  Orogastric tube placement  EXAM: PORTABLE ABDOMEN - 1 VIEW  COMPARISON:  None.  FINDINGS: The enteric tube extends well into the stomach with tip in the region of the distal gastric body.  IMPRESSION: Orogastric tube extends into the stomach.   Electronically Signed   By: Andreas Newport M.D.   On: 11/21/2014 00:20    ASSESSMENT / PLAN:  NEUROLOGIC A:   Status epilepticus. Acute metabolic encephalopathy. Hx Depression, CVA. P:   Neuro following. Keppra. cEEG not utilized, awaited int eeg result from neuro Sedation:  Propofol gtt / Fentanyl PRN - interrupt prop if ok by neuro RASS goal: -3, unless prop WUA done Any future imaging, MRI? Per neuro  PULMONARY OETT 7/26 >>> A: VDRF due to inability to protect airway in setting of status epilepticus. resp ALK P:   Alkalosis noted, ensure reduction in MV Repeat abg Avoid alkaosis If prop interruption will SBT, if not, no wean  CARDIOVASCULAR A:  Hx HTN, HLD. P:  Monitor hemodynamics. ASA, atorvastatin - per tube. Hold outpatient lisinopril.  RENAL A:   MIld hypercloremia P:   NS @ 125, kvo May need 1./2 NS BMP in AM.  GASTROINTESTINAL A:    GI prophylaxis. Nutrition. Hx gastritis. P:   SUP: Pantoprazole. NPO Start TF  HEMATOLOGIC A:   Iron deficiency anemia. VTE Prophylaxis. Plat clump P:  SCD's / Heparin. CBC in AM in citrated tube  INFECTIOUS A:   No indication for infection. P:   Monitor clinically. pcxr repeat in am  Ensure ua neg  ENDOCRINE A:   Hx DM - not on meds; now normoglycemic, glu borderline low x 1 P:   Monitor glucose on BMP. SSI if glucose consistently > 180. May need d5 in fluids Start TF, follow glu trend  Family updated: None.  Interdisciplinary Family Meeting v Palliative Care Meeting:  Due by: 11/26/14.  Ccm time 30 min   Lavon Paganini. Titus Mould, MD, [place] Pgr: [place] [facility] 11/21/2014 6:30 AM

## 2014-11-21 NOTE — Progress Notes (Signed)
EEG completed, results pending. 

## 2014-11-21 NOTE — Progress Notes (Signed)
eLink Physician-Brief Progress Note Patient Name: [name] [name] DOB: 1955/07/12 MRN: [id number]   Date of Service  11/21/2014  HPI/Events of Note  Patient biting on ETT ---> ventilator alarm triggered. Very reluctant to oversedate d/t fact that weaning attempts in AM may be hindered. Perfer to stay with Fentanyl as it can be reversed without precipitating seizures like reversing a benzodiazepine.   eICU Interventions  Will order: 1. Increase Fentanyl to 100 mg Q 1 hour PRN.  2. Place bite block.      Intervention Category Minor Interventions: Agitation / anxiety - evaluation and management  Baylin Cabal Cornelia Copa 11/21/2014, 10:35 PM

## 2014-11-21 NOTE — Progress Notes (Signed)
Utilization Review Completed.[name] [name] T7/27/2016  

## 2014-11-21 NOTE — Progress Notes (Signed)
NEURO HOSPITALIST PROGRESS NOTE   SUBJECTIVE:                                                                                                                        No new neurological developments. On propofol 25 mcg/min, intubated on the vent. As per patient nurse, she was able to follow some commands when propofol was lowered to 15 mcg. EEG reveals diffuse slowing without evidence of electrographic seizures. On keppra 1,500 mg BID.   OBJECTIVE:                                                                                                                           Vital signs in last 24 hours: Temp:  [94.7 F (34.8 C)-98.6 F (37 C)] 98.6 F (37 C) (07/27 0800) Pulse Rate:  [67-119] 83 (07/27 0905) Resp:  [12-30] 12 (07/27 0905) BP: (90-159)/(50-92) 92/50 mmHg (07/27 0905) SpO2:  [99 %-100 %] 100 % (07/27 0905) FiO2 (%):  [40 %-100 %] 40 % (07/27 0905) Weight:  [47.5 kg (104 lb 11.5 oz)-73 kg (160 lb 15 oz)] 47.5 kg (104 lb 11.5 oz) (07/27 0237)  Intake/Output from previous day: 07/26 0701 - 07/27 0700 In: 1471.7 [I.V.:1071.7; IV Piggyback:400] Out: 550 [Urine:550] Intake/Output this shift: Total I/O In: 36.8 [I.V.:36.8] Out: 150 [Urine:150] Nutritional status: Diet NPO time specified  Past Medical History  Diagnosis Date  . Uterine fibroid   . Gastritis   . Hiatal hernia   . Hemorrhoids   . Anemia   . Stroke 12 1989  . Dyslipidemia   . Hypertension   . Seizure disorder   . Carpal tunnel syndrome   . Depression   . Osteoporosis   . Urinary incontinence   . Iron deficiency anemia 2008    negative EGD and colonoscopy  . Diabetes mellitus   . Seizures      Physical exam: sedated with propofol, intubated on the vent. Head: normocephalic. Neck: supple, no bruits, no JVD. Cardiac: no murmurs. Lungs: clear. Abdomen: soft, no tender, no mass. Extremities: no edema. Skin: no rash   Neurologic Exam:  Mental  Status: Patient does not respond to verbal stimuli. Localizes to some degree with the LUE to deep sternal rub. Does  not follow commands. No verbalizations noted.  Cranial Nerves: II: patient does not respond confrontation bilaterally, pupils right 3 mm, left 3 mm,and reactive bilaterally III,IV,VI: Patient does not go beyond midline to the right with oculocephalic maneuver V,VII: corneal reflex present bilaterally  VIII: patient does not respond to verbal stimuli IX,X: gag reflex reduced, XI: trapezius strength unable to test bilaterally XII: tongue strength unable to test Motor: Increased tone on the right. Minimal spontaneous movement in the left upper and lower extremity. No movement noted on the right.  Sensory: Does not respond to noxious stimuli in any extremity. Deep Tendon Reflexes:  2+ in the upper extremities and absent in the lower extremities Plantars: upgoing bilaterally Cerebellar: Unable to perform  Lab Results: Lab Results  Component Value Date/Time   CHOL  03/24/2010 05:19 AM    115        ATP III CLASSIFICATION:  <200     mg/dL   Desirable  200-239  mg/dL   Borderline High  >=240    mg/dL   High          Lipid Panel  Recent Labs  11/21/14 0016  TRIG 56    Studies/Results: Ct Head Wo Contrast  11/20/2014   CLINICAL DATA:  59 year old female with seizure today. History of seizure disorder.  EXAM: CT HEAD WITHOUT CONTRAST  TECHNIQUE: Contiguous axial images were obtained from the base of the skull through the vertex without intravenous contrast.  COMPARISON:  05/02/2014 and prior exams  FINDINGS: Severe bifrontal encephalomalacia again identified.  No acute intracranial abnormalities are identified, including mass lesion or mass effect, hydrocephalus, extra-axial fluid collection, midline shift, hemorrhage, or acute infarction.  The visualized bony calvarium is unremarkable.  IMPRESSION: No evidence of acute intracranial abnormality.  Severe bifrontal  encephalomalacia.   Electronically Signed   By: Margarette Canada M.D.   On: 11/20/2014 19:30   Dg Chest Port 1 View  11/21/2014   CLINICAL DATA:  59 year old female with respiratory failure  EXAM: PORTABLE CHEST - 1 VIEW  COMPARISON:  Prior chest x-ray 11/20/2014  FINDINGS: The tip of the endotracheal tube is 3.3 cm above the carina. Nasogastric tube projects over the stomach. Cardiac and mediastinal contours remain within normal limits. The patient is rotated toward the left. The lungs remain clear. Unremarkable imaged upper abdominal bowel gas pattern. No acute osseous abnormality. Calcified left hilar lymph nodes.  IMPRESSION: 1. Interval placement of a nasogastric tube which appears well positioned overlying the gastric bubble. 2. The tip of the endotracheal tube is in good position 3.3 cm above the carina. 3. The lungs remain clear.   Electronically Signed   By: Jacqulynn Cadet M.D.   On: 11/21/2014 08:07   Portable Chest Xray  11/20/2014   CLINICAL DATA:  Seizure, post intubation.  EXAM: PORTABLE CHEST - 1 VIEW  COMPARISON:  Earlier this day at 1743 hour  FINDINGS: The endotracheal tube is 2.6 cm from the carina. The cardiomediastinal contours are normal. The lungs are clear. Pulmonary vasculature is normal. No consolidation, pleural effusion, or pneumothorax. Degenerative change of both shoulders. No acute osseous abnormalities are seen.  IMPRESSION: Endotracheal tube 2.6 cm from the carina.  Clear lungs.   Electronically Signed   By: Jeb Levering M.D.   On: 11/20/2014 22:03   Dg Chest Port 1 View  11/20/2014   CLINICAL DATA:  Seizures, unresponsive  EXAM: PORTABLE CHEST - 1 VIEW  COMPARISON:  05/01/2014  FINDINGS: The heart size and mediastinal contours  are within normal limits. Both lungs are clear. The visualized skeletal structures are unremarkable.  IMPRESSION: No active disease.   Electronically Signed   By: Skipper Cliche M.D.   On: 11/20/2014 17:58   Dg Abd Portable 1v  11/21/2014    CLINICAL DATA:  Orogastric tube placement  EXAM: PORTABLE ABDOMEN - 1 VIEW  COMPARISON:  None.  FINDINGS: The enteric tube extends well into the stomach with tip in the region of the distal gastric body.  IMPRESSION: Orogastric tube extends into the stomach.   Electronically Signed   By: Andreas Newport M.D.   On: 11/21/2014 00:20    MEDICATIONS                                                                                                                        Scheduled: . antiseptic oral rinse  7 mL Mouth Rinse QID  . aspirin  325 mg Per Tube Daily  . atorvastatin  40 mg Per Tube q1800  . chlorhexidine  15 mL Mouth Rinse BID  . feeding supplement (VITAL HIGH PROTEIN)  1,000 mL Per Tube Q24H  . heparin  5,000 Units Subcutaneous 3 times per day  . levETIRAcetam  1,500 mg Intravenous Q12H  . pantoprazole (PROTONIX) IV  40 mg Intravenous QHS    ASSESSMENT/PLAN:                                                                                                            59 year old female with a history of stroke and seizures presenting with breakthrough seizures. She remains sedated with propofol. No electrographic seizures on EEG. Continue IV keppra at current dose. Hoping to be able to wean off propofol and extubate over the following 48 hours. Will continue to follow  Dorian Pod, MD [facility] [phone]  11/21/2014, 9:26 AM

## 2014-11-21 NOTE — Progress Notes (Signed)
Initial Nutrition Assessment  DOCUMENTATION CODES:   Severe malnutrition in context of chronic illness, Underweight  INTERVENTION:   Initiate Vital High Protein @ 20 ml/hr via OG tube and increase by 10 ml every 12 hours to goal rate of 45 ml/hr.   Tube feeding regimen provides 1080 kcal, 94 grams of protein, and 902 ml of H2O.   TF regimen and propofol at current rate providing 1254 total kcal/day (105 % of kcal needs)   NUTRITION DIAGNOSIS:   Malnutrition related to chronic illness as evidenced by severe depletion of body fat, severe depletion of muscle mass.   GOAL:   Patient will meet greater than or equal to 90% of their needs   MONITOR:   TF tolerance, Vent status, Labs, I & O's, Skin  REASON FOR ASSESSMENT:   Consult Enteral/tube feeding initiation and management  ASSESSMENT:   59 y.o. F brought to [facility] ED 7/26 with seizures and status epilepticus.  Patient is currently intubated on ventilator support MV: 6.1 L/min Temp (24hrs), Avg:97.7 F (36.5 C), Min:94.7 F (34.8 C), Max:98.6 F (37 C)  Propofol: 6.6 ml/hr  Labs reviewed: Magnesium elevated, potassium and phosphorus are WNL Nutrition-Focused physical exam completed. Findings are severe fat depletion, severe muscle depletion, and no edema.   Per review of previous RD notes for this pt she has had continued weight loss since 2015. Per RD notes pt felt as if she had given up.   Pt at risk for refeeding syndrome due to severe malnutrition. Will advance TF slowly.   Diet Order:    NPO  Skin:  Reviewed, no issues  Last BM:  PTA  Height:   Ht Readings from Last 1 Encounters:  11/21/14 5\' 6"  (1.676 m)    Weight:   Wt Readings from Last 1 Encounters:  11/21/14 104 lb 11.5 oz (47.5 kg)    Ideal Body Weight:  59 kg  BMI:  Body mass index is 16.91 kg/(m^2).  Estimated Nutritional Needs:   Kcal:  1193  Protein:  75-95 grams  Fluid:  > 1.5 L/day  EDUCATION NEEDS:   No education needs  identified at this time  [place], [place], [place] Pager [phone] After Hours Pager

## 2014-11-22 ENCOUNTER — Inpatient Hospital Stay (HOSPITAL_COMMUNITY): Payer: Medicare HMO

## 2014-11-22 DIAGNOSIS — Z4659 Encounter for fitting and adjustment of other gastrointestinal appliance and device: Secondary | ICD-10-CM | POA: Insufficient documentation

## 2014-11-22 LAB — COMPREHENSIVE METABOLIC PANEL
ALK PHOS: 71 U/L (ref 38–126)
ALT: 70 U/L — AB (ref 14–54)
ANION GAP: 6 (ref 5–15)
AST: 36 U/L (ref 15–41)
Albumin: 2.9 g/dL — ABNORMAL LOW (ref 3.5–5.0)
BUN: 21 mg/dL — AB (ref 6–20)
CALCIUM: 9 mg/dL (ref 8.9–10.3)
CO2: 26 mmol/L (ref 22–32)
CREATININE: 0.73 mg/dL (ref 0.44–1.00)
Chloride: 108 mmol/L (ref 101–111)
GFR calc non Af Amer: >60 mL/min (ref >60)
Glucose, Bld: 72 mg/dL (ref 65–99)
Potassium: 3.8 mmol/L (ref 3.5–5.1)
Sodium: 140 mmol/L (ref 135–145)
TOTAL PROTEIN: 5.6 g/dL — AB (ref 6.5–8.1)
Total Bilirubin: 0.9 mg/dL (ref 0.3–1.2)

## 2014-11-22 LAB — URINALYSIS, ROUTINE W REFLEX MICROSCOPIC
Bilirubin Urine: NEGATIVE
Glucose, UA: NEGATIVE mg/dL
Ketones, ur: NEGATIVE mg/dL
Nitrite: NEGATIVE
PH: 6.5 (ref 5.0–8.0)
Protein, ur: NEGATIVE mg/dL
Specific Gravity, Urine: 1.018 (ref 1.005–1.030)
Urobilinogen, UA: 1 mg/dL (ref 0.0–1.0)

## 2014-11-22 LAB — CBC WITH DIFFERENTIAL/PLATELET
BASOS ABS: 0 10*3/uL (ref 0.0–0.1)
Basophils Relative: 0 % (ref 0–1)
EOS PCT: 1 % (ref 0–5)
Eosinophils Absolute: 0.1 10*3/uL (ref 0.0–0.7)
HCT: 27.8 % — ABNORMAL LOW (ref 36.0–46.0)
HEMOGLOBIN: 9.5 g/dL — AB (ref 12.0–15.0)
LYMPHS PCT: 20 % (ref 12–46)
Lymphs Abs: 1.3 10*3/uL (ref 0.7–4.0)
MCH: 31.9 pg (ref 26.0–34.0)
MCHC: 34.2 g/dL (ref 30.0–36.0)
MCV: 93.3 fL (ref 78.0–100.0)
MONOS PCT: 6 % (ref 3–12)
Monocytes Absolute: 0.4 10*3/uL (ref 0.1–1.0)
NEUTROS ABS: 4.7 10*3/uL (ref 1.7–7.7)
Neutrophils Relative %: 73 % (ref 43–77)
Platelets: UNDETERMINED 10*3/uL (ref 150–400)
RBC: 2.98 MIL/uL — AB (ref 3.87–5.11)
RDW: 15.7 % — ABNORMAL HIGH (ref 11.5–15.5)
WBC: 6.5 10*3/uL (ref 4.0–10.5)

## 2014-11-22 LAB — GLUCOSE, CAPILLARY
GLUCOSE-CAPILLARY: 66 mg/dL (ref 65–99)
GLUCOSE-CAPILLARY: 121 mg/dL — AB (ref 65–99)
GLUCOSE-CAPILLARY: 71 mg/dL (ref 65–99)
GLUCOSE-CAPILLARY: 83 mg/dL (ref 65–99)
Glucose-Capillary: 63 mg/dL — ABNORMAL LOW (ref 65–99)
Glucose-Capillary: 90 mg/dL (ref 65–99)
Glucose-Capillary: 68 mg/dL (ref 65–99)
Glucose-Capillary: 70 mg/dL (ref 65–99)
Glucose-Capillary: 100 mg/dL — ABNORMAL HIGH (ref 65–99)

## 2014-11-22 LAB — URINE MICROSCOPIC-ADD ON

## 2014-11-22 LAB — OSMOLALITY, URINE: Osmolality, Ur: 702 mOsm/kg (ref 390–1090)

## 2014-11-22 MED ORDER — DEXTROSE 50 % IV SOLN
INTRAVENOUS | Status: AC
  Administered 2014-11-22: 25 mL
  Filled 2014-11-22: qty 50

## 2014-11-22 MED ORDER — SODIUM CHLORIDE 0.9 % IV BOLUS (SEPSIS)
500.0000 mL | Freq: Once | INTRAVENOUS | Status: DC
Start: 2014-11-22 — End: 2014-11-23

## 2014-11-22 NOTE — Procedures (Signed)
Extubation Procedure Note  Patient Details:   Name: [name] [name] DOB: [birth year] MRN: [id number]   Airway Documentation:     Evaluation  O2 sats: stable throughout Complications: No apparent complications Patient did tolerate procedure well. Bilateral Breath Sounds: Clear, Diminished Suctioning: Oral, Airway Yes   Patient extubated to [place]. Vital signs stable throughout. Patient tolerated well. No complications. RN at bedside. RT will continue to monitor.  Mcneil Sober 11/22/2014, 9:32 AM

## 2014-11-22 NOTE — Progress Notes (Signed)
NEURO HOSPITALIST PROGRESS NOTE   SUBJECTIVE:                                                                                                                        Still intubated on he vent but awake and following commands. Off propofol No further seizures noted. On keppra 1,500 mg BID.  OBJECTIVE:                                                                                                                           Vital signs in last 24 hours: Temp:  [97.9 F (36.6 C)-99.8 F (37.7 C)] 99.8 F (37.7 C) (07/28 0753) Pulse Rate:  [59-100] 94 (07/28 0839) Resp:  [11-23] 23 (07/28 0839) BP: (80-138)/(49-80) 136/74 mmHg (07/28 0839) SpO2:  [100 %] 100 % (07/28 0839) FiO2 (%):  [40 %] 40 % (07/28 0839) Weight:  [47.8 kg (105 lb 6.1 oz)] 47.8 kg (105 lb 6.1 oz) (07/28 0302)  Intake/Output from previous day: 07/27 0701 - 07/28 0700 In: 941.9 [I.V.:338.8; NG/GT:373.2; IV Piggyback:230] Out: 832 [Urine:832] Intake/Output this shift:   Nutritional status:    Past Medical History  Diagnosis Date  . Uterine fibroid   . Gastritis   . Hiatal hernia   . Hemorrhoids   . Anemia   . Stroke 12 1989  . Dyslipidemia   . Hypertension   . Seizure disorder   . Carpal tunnel syndrome   . Depression   . Osteoporosis   . Urinary incontinence   . Iron deficiency anemia 2008    negative EGD and colonoscopy  . Diabetes mellitus   . Seizures    Physical exam: sedated with propofol, intubated on the vent. Head: normocephalic. Neck: supple, no bruits, no JVD. Cardiac: no murmurs. Lungs: clear. Abdomen: soft, no tender, no mass. Extremities: no edema. Skin: no rash  Neurologic Exam:  General: Mental Status: Off propofol, intubated but alert and awake and following commands. Cranial Nerves: II: Discs flat bilaterally; Visual fields grossly normal, pupils equal, round, reactive to light and accommodation III,IV, VI: ptosis not present,  extra-ocular motions intact bilaterally V,VII: smile symmetric, facial light touch sensation normal bilaterally VIII: hearing normal bilaterally IX,X: uvula rises symmetrically XI: bilateral shoulder shrug XII:  midline tongue extension without atrophy or fasciculations  Motor: Moves all extremities symmetrically Tone and bulk:normal tone throughout; no atrophy noted Sensory: Pinprick and light touch intact throughout, bilaterally Deep Tendon Reflexes:  Right: Upper Extremity   Left: Upper extremity   biceps (C-5 to C-6) 2/4   biceps (C-5 to C-6) 2/4 tricep (C7) 2/4    triceps (C7) 2/4 Brachioradialis (C6) 2/4  Brachioradialis (C6) 2/4  Lower Extremity Lower Extremity  quadriceps (L-2 to L-4) 2/4   quadriceps (L-2 to L-4) 2/4 Achilles (S1) 2/4   Achilles (S1) 2/4  Plantars: Right: downgoing   Left: downgoing Cerebellar: No tested Gait:  No tested due to clinical condition    Unable to perform  Lab Results: Lab Results  Component Value Date/Time   CHOL  03/24/2010 05:19 AM    115        ATP III CLASSIFICATION:  <200     mg/dL   Desirable  200-239  mg/dL   Borderline High  >=240    mg/dL   High          Lipid Panel  Recent Labs  11/21/14 0016  TRIG 56    Studies/Results: Ct Head Wo Contrast  11/20/2014   CLINICAL DATA:  59 year old female with seizure today. History of seizure disorder.  EXAM: CT HEAD WITHOUT CONTRAST  TECHNIQUE: Contiguous axial images were obtained from the base of the skull through the vertex without intravenous contrast.  COMPARISON:  05/02/2014 and prior exams  FINDINGS: Severe bifrontal encephalomalacia again identified.  No acute intracranial abnormalities are identified, including mass lesion or mass effect, hydrocephalus, extra-axial fluid collection, midline shift, hemorrhage, or acute infarction.  The visualized bony calvarium is unremarkable.  IMPRESSION: No evidence of acute intracranial abnormality.  Severe bifrontal encephalomalacia.    Electronically Signed   By: Margarette Canada M.D.   On: 11/20/2014 19:30   Dg Chest Port 1 View  11/22/2014   CLINICAL DATA:  Acute respiratory failure, check endotracheal tube placement  EXAM: PORTABLE CHEST - 1 VIEW  COMPARISON:  11/21/2014  FINDINGS: Cardiac shadow is stable. A nasogastric catheter is seen within the stomach. The endotracheal tube is noted 3.9 cm above the carina in satisfactory position. The lungs are clear bilaterally. No acute bony abnormality is seen.  IMPRESSION: No active disease.   Electronically Signed   By: Inez Catalina M.D.   On: 11/22/2014 08:00   Dg Chest Port 1 View  11/21/2014   CLINICAL DATA:  59 year old female with respiratory failure  EXAM: PORTABLE CHEST - 1 VIEW  COMPARISON:  Prior chest x-ray 11/20/2014  FINDINGS: The tip of the endotracheal tube is 3.3 cm above the carina. Nasogastric tube projects over the stomach. Cardiac and mediastinal contours remain within normal limits. The patient is rotated toward the left. The lungs remain clear. Unremarkable imaged upper abdominal bowel gas pattern. No acute osseous abnormality. Calcified left hilar lymph nodes.  IMPRESSION: 1. Interval placement of a nasogastric tube which appears well positioned overlying the gastric bubble. 2. The tip of the endotracheal tube is in good position 3.3 cm above the carina. 3. The lungs remain clear.   Electronically Signed   By: Jacqulynn Cadet M.D.   On: 11/21/2014 08:07   Portable Chest Xray  11/20/2014   CLINICAL DATA:  Seizure, post intubation.  EXAM: PORTABLE CHEST - 1 VIEW  COMPARISON:  Earlier this day at 1743 hour  FINDINGS: The endotracheal tube is 2.6 cm from the carina. The cardiomediastinal contours are  normal. The lungs are clear. Pulmonary vasculature is normal. No consolidation, pleural effusion, or pneumothorax. Degenerative change of both shoulders. No acute osseous abnormalities are seen.  IMPRESSION: Endotracheal tube 2.6 cm from the carina.  Clear lungs.   Electronically  Signed   By: Jeb Levering M.D.   On: 11/20/2014 22:03   Dg Chest Port 1 View  11/20/2014   CLINICAL DATA:  Seizures, unresponsive  EXAM: PORTABLE CHEST - 1 VIEW  COMPARISON:  05/01/2014  FINDINGS: The heart size and mediastinal contours are within normal limits. Both lungs are clear. The visualized skeletal structures are unremarkable.  IMPRESSION: No active disease.   Electronically Signed   By: Skipper Cliche M.D.   On: 11/20/2014 17:58   Dg Abd Portable 1v  11/21/2014   CLINICAL DATA:  Orogastric tube placement  EXAM: PORTABLE ABDOMEN - 1 VIEW  COMPARISON:  None.  FINDINGS: The enteric tube extends well into the stomach with tip in the region of the distal gastric body.  IMPRESSION: Orogastric tube extends into the stomach.   Electronically Signed   By: Andreas Newport M.D.   On: 11/21/2014 00:20    MEDICATIONS                                                                                                                        Scheduled: . antiseptic oral rinse  7 mL Mouth Rinse QID  . aspirin  325 mg Per Tube Daily  . atorvastatin  40 mg Per Tube q1800  . chlorhexidine  15 mL Mouth Rinse BID  . dextrose  1 ampule Intravenous Once  . feeding supplement (VITAL HIGH PROTEIN)  1,000 mL Per Tube Q24H  . heparin  5,000 Units Subcutaneous 3 times per day  . levETIRAcetam  1,500 mg Intravenous Q12H  . pantoprazole (PROTONIX) IV  40 mg Intravenous QHS  . sodium chloride  500 mL Intravenous Once    ASSESSMENT/PLAN:                                                                                                           59 year old female with a history of stroke and seizures presenting with breakthrough seizures. Remarkably improved. No electrographic seizures on EEG. Continue IV keppra at current dose. Hoping to be able to extubate today. Will follow up.  Dorian Pod, MD [facility] [phone]  11/22/2014, 9:10 AM

## 2014-11-22 NOTE — Progress Notes (Signed)
PULMONARY / CRITICAL CARE MEDICINE   Name: [name] [name] MRN: [id number] DOB: 1955/09/06    ADMISSION DATE:  11/20/2014 CONSULTATION DATE:  11/22/2014  REFERRING MD :  EDP  CHIEF COMPLAINT:  Seizures  INITIAL PRESENTATION:  59 y.o. F brought to [facility] ED 7/26 with seizures and status epilepticus.  Intubated by DF in ED for GCS ~ 4 and transferred to [facility] for continuous EEG monitoring.   STUDIES:  CT head 7/26 >>> no acute process.  Severe bilateral encephalomalacia. CXR 7/26 >>> no acute process. 7/27 eeg>>>no focus  SIGNIFICANT EVENTS: 7/26 - admitted. 7/27- increased aggitation 7/28- follows commands  SUBJECTIVE: follows commands off prop   VITAL SIGNS: Temp:  [97.9 F (36.6 C)-99.8 F (37.7 C)] 99.8 F (37.7 C) (07/28 0753) Pulse Rate:  [59-100] 90 (07/28 0700) Resp:  [11-22] 14 (07/28 0700) BP: (80-138)/(49-80) 138/68 mmHg (07/28 0700) SpO2:  [100 %] 100 % (07/28 0700) FiO2 (%):  [40 %] 40 % (07/28 0405) Weight:  [47.8 kg (105 lb 6.1 oz)] 47.8 kg (105 lb 6.1 oz) (07/28 0302) HEMODYNAMICS:   VENTILATOR SETTINGS: Vent Mode:  [-] PRVC FiO2 (%):  [40 %] 40 % Set Rate:  [12 bmp] 12 bmp Vt Set:  [450 mL] 450 mL PEEP:  [5 cmH20] 5 cmH20 Plateau Pressure:  [12 cmH20-14 cmH20] 14 cmH20 INTAKE / OUTPUT: Intake/Output      07/27 0701 - 07/28 0700 07/28 0701 - 07/29 0700   I.V. (mL/kg) 338.8 (7.1)    NG/GT 373.2    IV Piggyback 230    Total Intake(mL/kg) 941.9 (19.7)    Urine (mL/kg/hr) 832 (0.7)    Total Output 832     Net +109.9            PHYSICAL EXAMINATION: General: Chronically ill appearing female, in NAD Neuro: rass 0, moves all ext at basline, fc HEENT: [place]/AT. PERRL 2 mm Cardiovascular: RRR, no M/R/G.  Lungs: CTA Abdomen: BS x 4, soft, NT/ND.  Musculoskeletal: no edema.  Skin: Dry / flaking, warm, no rashes.  LABS:  CBC  Recent Labs Lab 11/20/14 1753 11/21/14 0016 11/22/14 0305  WBC 6.8 8.5 6.5  HGB 10.9* 10.1* 9.5*  HCT 32.6* 29.7* 27.8*  PLT  PLATELET CLUMPS NOTED ON SMEAR, COUNT APPEARS ADEQUATE PLATELET CLUMPS NOTED ON SMEAR, COUNT APPEARS DECREASED PLATELET CLUMPS NOTED ON SMEAR, UNABLE TO ESTIMATE   Coag's No results for input(s): APTT, INR in the last 168 hours. BMET  Recent Labs Lab 11/20/14 1753 11/21/14 0016 11/22/14 0305  NA 143 142 140  K 3.4* 3.8 3.8  CL 108 110 108  CO2 25 26 26   BUN 31* 24* 21*  CREATININE 0.72 0.55 0.73  GLUCOSE 71 89 72   Electrolytes  Recent Labs Lab 11/20/14 1753 11/20/14 2000 11/21/14 0016 11/22/14 0305  CALCIUM 9.5  --  9.1 9.0  MG  --  1.4* 2.6*  --   PHOS  --  2.9 2.5  --    Sepsis Markers No results for input(s): LATICACIDVEN, PROCALCITON, O2SATVEN in the last 168 hours. ABG  Recent Labs Lab 11/20/14 2158 11/21/14 0752  PHART 7.545* 7.424  PCO2ART 29.4* 39.7  PO2ART 197* 179*   Liver Enzymes  Recent Labs Lab 11/22/14 0305  AST 36  ALT 70*  ALKPHOS 71  BILITOT 0.9  ALBUMIN 2.9*   Cardiac Enzymes  Recent Labs Lab 11/20/14 1753  TROPONINI <0.03   Glucose  Recent Labs Lab 11/21/14 2329 11/22/14 0148 11/22/14 0330 11/22/14 0334 11/22/14  0454 11/22/14 0751  GLUCAP 63* 90 66 68 121* 71    Imaging Dg Chest Port 1 View  11/22/2014   CLINICAL DATA:  Acute respiratory failure, check endotracheal tube placement  EXAM: PORTABLE CHEST - 1 VIEW  COMPARISON:  11/21/2014  FINDINGS: Cardiac shadow is stable. A nasogastric catheter is seen within the stomach. The endotracheal tube is noted 3.9 cm above the carina in satisfactory position. The lungs are clear bilaterally. No acute bony abnormality is seen.  IMPRESSION: No active disease.   Electronically Signed   By: Inez Catalina M.D.   On: 11/22/2014 08:00    ASSESSMENT / PLAN:  NEUROLOGIC A:   Status epilepticus. Acute metabolic encephalopathy. Hx Depression, CVA. P:   Neuro following. Keppra. eeg neg x 1, no further planned Sedation:  Propofol gtt WUA, , now off RASS goal:0  PULMONARY OETT  7/26 >>> A: VDRF due to inability to protect airway in setting of status epilepticus. resp ALK P:   Weaning cpap 5 ps 5, goal 30 min  Strong cough and good neurostatus wua  CARDIOVASCULAR A:  Hx HTN, HLD. P:  Monitor hemodynamics. ASA, atorvastatin - per tube. Lisinopril can start if needed  RENAL A:   Low output per RN likley hypovolemic P:   Goal 15 cc/hr Bolus Re add saline UA, urine osm  GASTROINTESTINAL A:   GI prophylaxis. Nutrition. Hx gastritis. P:   SUP: Pantoprazole. TF hold for weaning  HEMATOLOGIC A:   Iron deficiency anemia. VTE Prophylaxis. Plat clump P:  SCD's / Heparin. CBC in AM in citrated tube, remained clumped, will ensure done  INFECTIOUS A:   No indication for infection. P:   pcxr neg Ensure ua neg- was  ENDOCRINE A:   Hx DM - not on meds; now normoglycemic, glu borderline P:   Monitor glucose on BMP. SSI if glucose consistently > 180. May need d5 in fluids once Tf are off   Family updated: None.  Interdisciplinary Family Meeting v Palliative Care Meeting:  Due by: 11/26/14.  Ccm time 30 min   Lavon Paganini. Titus Mould, MD, [place] Pgr: [facility] 11/22/2014 8:20 AM

## 2014-11-22 NOTE — Evaluation (Signed)
Clinical/Bedside Swallow Evaluation Patient Details  Name: [name] [name] MRN: [id number] Date of Birth: 1955/07/15  Today's Date: 11/22/2014 Time: SLP Start Time (ACUTE ONLY): 1342 SLP Stop Time (ACUTE ONLY): 1356 SLP Time Calculation (min) (ACUTE ONLY): 14 min  Past Medical History:  Past Medical History  Diagnosis Date  . Uterine fibroid   . Gastritis   . Hiatal hernia   . Hemorrhoids   . Anemia   . Stroke 12 1989  . Dyslipidemia   . Hypertension   . Seizure disorder   . Carpal tunnel syndrome   . Depression   . Osteoporosis   . Urinary incontinence   . Iron deficiency anemia 2008    negative EGD and colonoscopy  . Diabetes mellitus   . Seizures    Past Surgical History:  Past Surgical History  Procedure Laterality Date  . Colonoscopy  2008  . Upper gastrointestinal endoscopy  2008  . Tracheostomy    . Breast surgery  1977  . Tubal ligation    . Cesarean section      x2  . Tracheostomy closure     HPI:  59 y.o. F brought to [facility] ED 7/26 with seizures and status epilepticus. Intubated 7/26 to 7/28. CT head clear, CXR clear.    Assessment / Plan / Recommendation Clinical Impression  Pt demonstrates moderately hoarse vocal quality though cough is strong and pts oral and oropharyngeal strength appear adequate. Voice improved throughout session with increased effort. Pt given over 8oz of liquid with no overt signs of aspiration. Solids tolerated well. Recommend a regular diet and thin liquids. No SLP f/u needed    Aspiration Risk  Mild    Diet Recommendation Age appropriate regular solids;Thin   Medication Administration: Whole meds with liquid    Other  Recommendations Oral Care Recommendations: Oral care BID   Follow Up Recommendations       Frequency and Duration        Pertinent Vitals/Pain NA    SLP Swallow Goals     Swallow Study Prior Functional Status       General Other Pertinent Information: 59 y.o. F brought to [facility] ED 7/26 with seizures and  status epilepticus. Intubated 7/26 to 7/28. CT head clear, CXR clear.  Type of Study: Bedside swallow evaluation Previous Swallow Assessment: per pt, history of dysphagia, but she cannot give specifics.  Diet Prior to this Study: NPO Temperature Spikes Noted: No Respiratory Status: Supplemental O2 delivered via (comment) Behavior/Cognition: Alert;Cooperative;Pleasant mood Oral Cavity - Dentition: Adequate natural dentition/normal for age Self-Feeding Abilities: Able to feed self;Needs assist Patient Positioning: Upright in bed Baseline Vocal Quality: Hoarse;Low vocal intensity Volitional Cough: Strong Volitional Swallow: Able to elicit    Oral/Motor/Sensory Function Overall Oral Motor/Sensory Function: Appears within functional limits for tasks assessed   Ice Chips     Thin Liquid Thin Liquid: Within functional limits    Nectar Thick Nectar Thick Liquid: Not tested   Honey Thick Honey Thick Liquid: Not tested   Puree Puree: Within functional limits   Solid   GO    Solid: Within functional limits      Herbie Baltimore, MA CCC-SLP [phone]  Catheryne Deford, Katherene Ponto 11/22/2014,2:49 PM

## 2014-11-22 NOTE — Clinical Documentation Improvement (Signed)
Possible Clinical Conditions? (please answer in progress notes and Discharge Summary)  Severe Protein Calorie Malnutrition Moderate malnutrition Mild malnutrition  Cachexia   Other Condition Cannot clinically determine  Supporting Information: Per RD assessment: "Severe malnutrition in context of chronic illness. Underweight" Risk Factors: Signs & Symptoms: BMI = 16.91 Diagnostics: Per RD assessment: "Malnutrition related to chronic illness as evidenced by severe depletion of body fat, severe depletion of muscle mass" Treatment: Tube feedings  Thank You,  Carrolyn Meiers, RN [email or web address] [phone]

## 2014-11-23 DIAGNOSIS — I209 Angina pectoris, unspecified: Secondary | ICD-10-CM

## 2014-11-23 LAB — GLUCOSE, CAPILLARY
GLUCOSE-CAPILLARY: 80 mg/dL (ref 65–99)
GLUCOSE-CAPILLARY: 88 mg/dL (ref 65–99)
GLUCOSE-CAPILLARY: 111 mg/dL — AB (ref 65–99)
Glucose-Capillary: 140 mg/dL — ABNORMAL HIGH (ref 65–99)
Glucose-Capillary: 149 mg/dL — ABNORMAL HIGH (ref 65–99)
Glucose-Capillary: 66 mg/dL (ref 65–99)
Glucose-Capillary: 76 mg/dL (ref 65–99)

## 2014-11-23 LAB — BASIC METABOLIC PANEL
Anion gap: 6 (ref 5–15)
BUN: 16 mg/dL (ref 6–20)
CO2: 25 mmol/L (ref 22–32)
CREATININE: 0.56 mg/dL (ref 0.44–1.00)
Calcium: 8.7 mg/dL — ABNORMAL LOW (ref 8.9–10.3)
Chloride: 110 mmol/L (ref 101–111)
GFR calc Af Amer: >60 mL/min (ref >60)
GFR calc non Af Amer: >60 mL/min (ref >60)
Glucose, Bld: 93 mg/dL (ref 65–99)
Potassium: 3.5 mmol/L (ref 3.5–5.1)
Sodium: 141 mmol/L (ref 135–145)

## 2014-11-23 MED ORDER — ASPIRIN 325 MG PO TABS
325.0000 mg | ORAL_TABLET | Freq: Every day | ORAL | Status: DC
  Administered 2014-11-24 – 2014-11-25 (×2): 325 mg via ORAL
  Filled 2014-11-23 (×2): qty 1

## 2014-11-23 MED ORDER — PANTOPRAZOLE SODIUM 40 MG PO TBEC
40.0000 mg | DELAYED_RELEASE_TABLET | Freq: Every day | ORAL | Status: DC
  Administered 2014-11-23 – 2014-11-24 (×2): 40 mg via ORAL
  Filled 2014-11-23 (×2): qty 1

## 2014-11-23 MED ORDER — LEVETIRACETAM 750 MG PO TABS
1500.0000 mg | ORAL_TABLET | Freq: Two times a day (BID) | ORAL | Status: DC
  Administered 2014-11-23 – 2014-11-25 (×4): 1500 mg via ORAL
  Filled 2014-11-23 (×5): qty 2

## 2014-11-23 NOTE — Progress Notes (Signed)
Nutrition Follow-up  DOCUMENTATION CODES:   Severe malnutrition in context of chronic illness, Underweight  INTERVENTION:   Magic cup with meals.    NUTRITION DIAGNOSIS:   Malnutrition related to chronic illness as evidenced by severe depletion of body fat, severe depletion of muscle mass.  ongoing  GOAL:   Patient will meet greater than or equal to 90% of their needs  Not met.   MONITOR:   PO intake, Supplement acceptance, I & O's, Labs  REASON FOR ASSESSMENT:   Consult Enteral/tube feeding initiation and management  ASSESSMENT:   59 y.o. F brought to [facility] ED 7/26 with seizures and status epilepticus. Pt with hx of seizures and depression.   Pt extubated 7/28. Passed swallow eval and now on Regular diet.  Labs reviewed. Pt slow but eats 100% of her meals.   Diet Order:  Diet regular Room service appropriate?: Yes; Fluid consistency:: Thin  Skin:  Reviewed, no issues  Last BM:  PTA  Height:   Ht Readings from Last 1 Encounters:  11/21/14 _0  (1.676 m)    Weight:   Wt Readings from Last 1 Encounters:  11/23/14 105 lb 6.1 oz (47.8 kg)    Ideal Body Weight:  59 kg  BMI:  Body mass index is 17.02 kg/(m^2).  Estimated Nutritional Needs:   Kcal:  1300-1500  Protein:  70-80 grams  Fluid:  > 1.5 L/day  EDUCATION NEEDS:   No education needs identified at this time  [place], [place], [place] Pager [phone] After Hours Pager

## 2014-11-23 NOTE — Progress Notes (Signed)
Patient transferred from [place]. She is alert/oriented. Denied any pain at this time. Safety precautions and orders reviewed with patient. VSS. Will continue to monitor.  Ave Filter, RN

## 2014-11-23 NOTE — Progress Notes (Signed)
PULMONARY / CRITICAL CARE MEDICINE   Name: [name] [name] MRN: [id number] DOB: 07/08/1955    ADMISSION DATE:  11/20/2014 CONSULTATION DATE:  11/23/2014  REFERRING MD :  EDP  CHIEF COMPLAINT:  Seizures  INITIAL PRESENTATION:  59 y.o. F brought to [facility] ED 7/26 with seizures and status epilepticus.  Intubated by DF in ED for GCS ~ 4 and transferred to [facility] for continuous EEG monitoring.  STUDIES:  CT head 7/26 >>> no acute process.  Severe bilateral encephalomalacia. CXR 7/26 >>> no acute process. 7/27 eeg>>>no focus  SIGNIFICANT EVENTS: 7/26 - admitted. 7/27- increased aggitation 7/28- follows commands  SUBJECTIVE:  Extubated, tolerating well. No complaints  VITAL SIGNS: Temp:  [97.3 F (36.3 C)-98.3 F (36.8 C)] 97.3 F (36.3 C) (07/29 0821) Pulse Rate:  [46-115] 53 (07/29 1500) Resp:  [11-20] 12 (07/29 1500) BP: (84-147)/(42-96) 118/62 mmHg (07/29 1500) SpO2:  [92 %-100 %] 100 % (07/29 1500) Weight:  [47.8 kg (105 lb 6.1 oz)] 47.8 kg (105 lb 6.1 oz) (07/29 0500) HEMODYNAMICS:   VENTILATOR SETTINGS:   INTAKE / OUTPUT: Intake/Output      07/28 0701 - 07/29 0700 07/29 0701 - 07/30 0700   P.O. 120 360   I.V. (mL/kg) 1700.3 (35.6) 600 (12.6)   NG/GT 30    IV Piggyback 215 100   Total Intake(mL/kg) 2065.3 (43.2) 1060 (22.2)   Urine (mL/kg/hr) 1465 (1.3) 515 (1.3)   Total Output 1465 515   Net +600.3 +545          PHYSICAL EXAMINATION: General: Chronically ill appearing female, in NAD Neuro: alert, oriented, non-focal. Mild confusion HEENT: [place]/AT. PERRL 2 mm Cardiovascular: RRR, no M/R/G.  Lungs: CTA Abdomen: BS x 4, soft, NT/ND.  Musculoskeletal: no edema.  Skin: Dry / flaking, warm, no rashes.  LABS:  CBC  Recent Labs Lab 11/20/14 1753 11/21/14 0016 11/22/14 0305  WBC 6.8 8.5 6.5  HGB 10.9* 10.1* 9.5*  HCT 32.6* 29.7* 27.8*  PLT PLATELET CLUMPS NOTED ON SMEAR, COUNT APPEARS ADEQUATE PLATELET CLUMPS NOTED ON SMEAR, COUNT APPEARS DECREASED PLATELET CLUMPS  NOTED ON SMEAR, UNABLE TO ESTIMATE   Coag's No results for input(s): APTT, INR in the last 168 hours. BMET  Recent Labs Lab 11/21/14 0016 11/22/14 0305 11/23/14 0232  NA 142 140 141  K 3.8 3.8 3.5  CL 110 108 110  CO2 26 26 25   BUN 24* 21* 16  CREATININE 0.55 0.73 0.56  GLUCOSE 89 72 93   Electrolytes  Recent Labs Lab 11/20/14 2000 11/21/14 0016 11/22/14 0305 11/23/14 0232  CALCIUM  --  9.1 9.0 8.7*  MG 1.4* 2.6*  --   --   PHOS 2.9 2.5  --   --    Sepsis Markers No results for input(s): LATICACIDVEN, PROCALCITON, O2SATVEN in the last 168 hours. ABG  Recent Labs Lab 11/20/14 2158 11/21/14 0752  PHART 7.545* 7.424  PCO2ART 29.4* 39.7  PO2ART 197* 179*   Liver Enzymes  Recent Labs Lab 11/22/14 0305  AST 36  ALT 70*  ALKPHOS 71  BILITOT 0.9  ALBUMIN 2.9*   Cardiac Enzymes  Recent Labs Lab 11/20/14 1753  TROPONINI <0.03   Glucose  Recent Labs Lab 11/22/14 1551 11/22/14 1942 11/22/14 2330 11/23/14 0408 11/23/14 0507 11/23/14 0820  GLUCAP 83 100* 76 66 80 88    Imaging No results found.  ASSESSMENT / PLAN:  NEUROLOGIC A:   Status epilepticus. Acute metabolic encephalopathy. Hx Depression, CVA.  P:   Neuro following. Keppra.  PULMONARY OETT 7/26 >>>7/28 A: VDRF due to inability to protect airway in setting of status epilepticus. > resolved resp ALK  P:   Extubated Monitor  CARDIOVASCULAR A:  Hx HTN, HLD.  P:  Monitor hemodynamics. ASA, atorvastatin Holding home lisinopril as BP OK  RENAL A:   UOP improved  P:   NS infusion at 75/hr  GASTROINTESTINAL A:   GI prophylaxis. Nutrition. Hx gastritis.  P:   SUP: Pantoprazole. Carb mod diet, tolerating  HEMATOLOGIC A:   Iron deficiency anemia. VTE Prophylaxis. Plat clump  P:  SCD's / Heparin. CBC in AM, plt clumps repeat  INFECTIOUS A:   No indication for infection.  P:   PCXR UA neg Follow WBC and fever curve  ENDOCRINE A:   Hx DM - not  on meds; now normoglycemic, glu borderline  P:   Monitor glucose on BMP. SSI if glucose consistently > 180.   Family updated: Updated patient  Interdisciplinary Family Meeting v Palliative Care Meeting:  Due by: 11/26/14.  Summary:  Extubated, tolerating well, tolerating diet. No additional seizure activity on Keppra.   To Floor, [facility] 7/30 PCCM off.  Georgann Housekeeper, AGACNP-BC  Pulmonology/Critical Care Pager [phone] or [phone]  11/23/2014 3:48 PM     STAFF NOTE: Linwood Dibbles, MD FACP have personally reviewed patient's available data, including medical history, events of note, physical examination and test results as part of my evaluation. I have discussed with resident/NP and other care providers such as pharmacist, RN and RRT. In addition, I personally evaluated patient and elicited key findings of: no distress, no seizures,  consider kvo, diet, has progressed well without aspiration, to [facility], floor  Lavon Paganini. Titus Mould, MD, [place] Pgr: [place] 11/23/2014 4:16 PM

## 2014-11-24 DIAGNOSIS — G40901 Epilepsy, unspecified, not intractable, with status epilepticus: Secondary | ICD-10-CM | POA: Insufficient documentation

## 2014-11-24 DIAGNOSIS — G40301 Generalized idiopathic epilepsy and epileptic syndromes, not intractable, with status epilepticus: Secondary | ICD-10-CM

## 2014-11-24 DIAGNOSIS — J9600 Acute respiratory failure, unspecified whether with hypoxia or hypercapnia: Secondary | ICD-10-CM

## 2014-11-24 LAB — CBC
HCT: 25.5 % — ABNORMAL LOW (ref 36.0–46.0)
HEMOGLOBIN: 8.4 g/dL — AB (ref 12.0–15.0)
MCH: 31.6 pg (ref 26.0–34.0)
MCHC: 32.9 g/dL (ref 30.0–36.0)
MCV: 95.9 fL (ref 78.0–100.0)
RBC: 2.66 MIL/uL — AB (ref 3.87–5.11)
RDW: 15.6 % — ABNORMAL HIGH (ref 11.5–15.5)
WBC: 4.5 10*3/uL (ref 4.0–10.5)

## 2014-11-24 LAB — BASIC METABOLIC PANEL
Anion gap: 4 — ABNORMAL LOW (ref 5–15)
BUN: 15 mg/dL (ref 6–20)
CALCIUM: 8.8 mg/dL — AB (ref 8.9–10.3)
CHLORIDE: 111 mmol/L (ref 101–111)
CO2: 26 mmol/L (ref 22–32)
Creatinine, Ser: 0.52 mg/dL (ref 0.44–1.00)
GFR calc Af Amer: >60 mL/min (ref >60)
GFR calc non Af Amer: >60 mL/min (ref >60)
GLUCOSE: 92 mg/dL (ref 65–99)
Potassium: 4 mmol/L (ref 3.5–5.1)
Sodium: 141 mmol/L (ref 135–145)

## 2014-11-24 LAB — GLUCOSE, CAPILLARY
GLUCOSE-CAPILLARY: 49 mg/dL — AB (ref 65–99)
GLUCOSE-CAPILLARY: 57 mg/dL — AB (ref 65–99)
Glucose-Capillary: 77 mg/dL (ref 65–99)
Glucose-Capillary: 82 mg/dL (ref 65–99)
Glucose-Capillary: 99 mg/dL (ref 65–99)

## 2014-11-24 MED ORDER — GLUCOSE 40 % PO GEL
1.0000 | Freq: Once | ORAL | Status: DC
Start: 2014-11-24 — End: 2014-11-24

## 2014-11-24 MED ORDER — GLUCOSE 40 % PO GEL
ORAL | Status: AC
  Administered 2014-11-24: 37.5 g
  Filled 2014-11-24: qty 1

## 2014-11-24 NOTE — Progress Notes (Signed)
NEURO HOSPITALIST PROGRESS NOTE   SUBJECTIVE:                                                                                                                        Resting comfortably in bed, eating breakfast. Stated that she is doing much better. No further seizures noted. On keppra 1,500 mg BID which she seems to be tolerating well.  OBJECTIVE:                                                                                                                           Vital signs in last 24 hours: Temp:  [97.3 F (36.3 C)-97.8 F (36.6 C)] 97.6 F (36.4 C) (07/30 0533) Pulse Rate:  [50-115] 50 (07/30 0533) Resp:  [11-20] 18 (07/30 0533) BP: (100-147)/(42-96) 143/72 mmHg (07/30 0533) SpO2:  [92 %-100 %] 100 % (07/30 0533) Weight:  [55.4 kg (122 lb 2.2 oz)] 55.4 kg (122 lb 2.2 oz) (07/30 0533)  Intake/Output from previous day: 07/29 0701 - 07/30 0700 In: 1450 [P.O.:600; I.V.:750; IV Piggyback:100] Out: 2005 [Urine:2005] Intake/Output this shift:   Nutritional status: Diet Carb Modified Fluid consistency:: Thin; Room service appropriate?: Yes  Past Medical History  Diagnosis Date  . Uterine fibroid   . Gastritis   . Hiatal hernia   . Hemorrhoids   . Anemia   . Stroke 12 1989  . Dyslipidemia   . Hypertension   . Seizure disorder   . Carpal tunnel syndrome   . Depression   . Osteoporosis   . Urinary incontinence   . Iron deficiency anemia 2008    negative EGD and colonoscopy  . Diabetes mellitus   . Seizures   Physical exam: sedated with propofol, intubated on the vent. Head: normocephalic. Neck: supple, no bruits, no JVD. Cardiac: no murmurs. Lungs: clear. Abdomen: soft, no tender, no mass. Extremities: no edema. Skin: no rash   Neurologic Exam:  General: Mental Status: Off propofol, intubated but alert and awake and following commands. Cranial Nerves: II: Discs flat bilaterally; Visual fields grossly normal, pupils  equal, round, reactive to light and accommodation III,IV, VI: ptosis not present, extra-ocular motions intact bilaterally V,VII: smile symmetric, facial light touch sensation normal bilaterally VIII: hearing normal bilaterally IX,X:  uvula rises symmetrically XI: bilateral shoulder shrug XII: midline tongue extension without atrophy or fasciculations  Motor: Moves all extremities symmetrically Tone and bulk:normal tone throughout; no atrophy noted Sensory: Pinprick and light touch intact throughout, bilaterally Deep Tendon Reflexes:  Right: Upper Extremity Left: Upper extremity   biceps (C-5 to C-6) 2/4 biceps (C-5 to C-6) 2/4 tricep (C7) 2/4triceps (C7) 2/4 Brachioradialis (C6) 2/4Brachioradialis (C6) 2/4  Lower Extremity Lower Extremity  quadriceps (L-2 to L-4) 2/4 quadriceps (L-2 to L-4) 2/4 Achilles (S1) 2/4Achilles (S1) 2/4  Plantars: Right: downgoingLeft: downgoing Cerebellar: No tested Gait:  No tested due to clinical condition   Lab Results: Lab Results  Component Value Date/Time   CHOL  03/24/2010 05:19 AM    115        ATP III CLASSIFICATION:  <200     mg/dL   Desirable  200-239  mg/dL   Borderline High  >=240    mg/dL   High          Lipid Panel No results for input(s): CHOL, TRIG, HDL, CHOLHDL, VLDL, LDLCALC in the last 72 hours.  Studies/Results: No results found.  MEDICATIONS                                                                                                                        Scheduled: . antiseptic oral rinse  7 mL Mouth Rinse QID  . aspirin  325 mg Oral Daily  . atorvastatin  40 mg Per Tube q1800  . heparin  5,000 Units Subcutaneous 3 times per day  . levETIRAcetam  1,500 mg Oral BID  . pantoprazole  40 mg Oral QHS    ASSESSMENT/PLAN:                                                                                                            59 year old female with a history of stroke and seizures presenting with breakthrough seizures. Patient is back to baseline. Continue keppra 1,500 mg BID. Neurology will sign off.   Dorian Pod, MD [facility] [phone]  11/24/2014, 8:17 AM

## 2014-11-24 NOTE — Progress Notes (Signed)
PROGRESS NOTE  [name] [name] HUD:[id number] DOB: 23-Jan-1956 DOA: 11/20/2014 PCP: Helane Rima, MD  Assessment/Plan: Seizures- s/p intubation for status epilepticus -keppra -neuro signed off  Hypoglycemia -change to regular diet Not on any meds  Code Status: full Family Communication: patient/daughter Disposition Plan: home in AM??   Consultants:  neuro  Procedures:      HPI/Subjective: Doing well No complaints  Objective: Filed Vitals:   11/24/14 1019  BP: 139/69  Pulse: 51  Temp: 97.6 F (36.4 C)  Resp: 20    Intake/Output Summary (Last 24 hours) at 11/24/14 1301 Last data filed at 11/24/14 1206  Gross per 24 hour  Intake   1020 ml  Output   2490 ml  Net  -1470 ml   Filed Weights   11/22/14 0302 11/23/14 0500 11/24/14 0533  Weight: 47.8 kg (105 lb 6.1 oz) 47.8 kg (105 lb 6.1 oz) 55.4 kg (122 lb 2.2 oz)    Exam:   General:  Awake, NAD  Cardiovascular: rrr  Respiratory: clear  Abdomen: +BS, soft  Musculoskeletal: no edema   Data Reviewed: Basic Metabolic Panel:  Recent Labs Lab 11/20/14 1753 11/20/14 2000 11/21/14 0016 11/22/14 0305 11/23/14 0232 11/24/14 0428  NA 143  --  142 140 141 141  K 3.4*  --  3.8 3.8 3.5 4.0  CL 108  --  110 108 110 111  CO2 25  --  26 26 25 26   GLUCOSE 71  --  89 72 93 92  BUN 31*  --  24* 21* 16 15  CREATININE 0.72  --  0.55 0.73 0.56 0.52  CALCIUM 9.5  --  9.1 9.0 8.7* 8.8*  MG  --  1.4* 2.6*  --   --   --   PHOS  --  2.9 2.5  --   --   --    Liver Function Tests:  Recent Labs Lab 11/22/14 0305  AST 36  ALT 70*  ALKPHOS 71  BILITOT 0.9  PROT 5.6*  ALBUMIN 2.9*   No results for input(s): LIPASE, AMYLASE in the last 168 hours. No results for input(s): AMMONIA in the last 168 hours. CBC:  Recent Labs Lab 11/20/14 1753 11/21/14 0016 11/22/14 0305 11/24/14 0428  WBC 6.8 8.5 6.5 4.5  NEUTROABS 5.0  --  4.7  --   HGB 10.9* 10.1* 9.5* 8.4*  HCT 32.6* 29.7* 27.8* 25.5*  MCV 95.6 94.3  93.3 95.9  PLT PLATELET CLUMPS NOTED ON SMEAR, COUNT APPEARS ADEQUATE PLATELET CLUMPS NOTED ON SMEAR, COUNT APPEARS DECREASED PLATELET CLUMPS NOTED ON SMEAR, UNABLE TO ESTIMATE PLATELET CLUMPING, SUGGEST RECOLLECTION OF SAMPLE IN CITRATE TUBE.   Cardiac Enzymes:  Recent Labs Lab 11/20/14 1753  TROPONINI <0.03   BNP (last 3 results)  Recent Labs  05/02/14 1127  BNP 313.9*    ProBNP (last 3 results) No results for input(s): PROBNP in the last 8760 hours.  CBG:  Recent Labs Lab 11/23/14 2208 11/24/14 0614 11/24/14 1150 11/24/14 1209 11/24/14 1236  GLUCAP 149* 77 49* 57* 82    Recent Results (from the past 240 hour(s))  MRSA PCR Screening     Status: None   Collection Time: 11/20/14 11:54 PM  Result Value Ref Range Status   MRSA by PCR NEGATIVE NEGATIVE Final    Comment:        The GeneXpert MRSA Assay (FDA approved for NASAL specimens only), is one component of a comprehensive MRSA colonization surveillance program. It is not intended to diagnose MRSA  infection nor to guide or monitor treatment for MRSA infections.      Studies: No results found.  Scheduled Meds: . antiseptic oral rinse  7 mL Mouth Rinse QID  . aspirin  325 mg Oral Daily  . atorvastatin  40 mg Per Tube q1800  . heparin  5,000 Units Subcutaneous 3 times per day  . levETIRAcetam  1,500 mg Oral BID  . pantoprazole  40 mg Oral QHS   Continuous Infusions:  Antibiotics Given (last 72 hours)    None      Active Problems:   Seizure   Chest pain   Acute respiratory failure   Encounter for orogastric (OG) tube placement    Time spent: 25 min    Karthik Whittinghill  [facility] Pager [phone] If 7PM-7AM, please contact night-coverage at [email or web address], password [facility] 11/24/2014, 1:01 PM  LOS: 4 days

## 2014-11-24 NOTE — Progress Notes (Signed)
CSW (Clinical Education officer, museum) made aware by MD that pt will need transportation assistance tomorrow. Pt wheelchair bound and per daughter uses [facility] in the community. CSW communicate to pt daughter that it is unlikely we will be able to arrange weekend [facility] transport. Pt daughter agreeable to pt returning home with non-emergent ambulance transport. Pt family made aware non-emergent transport will not be able to bring pt wheelchair. Family agreeable to picking up wheelchair tomorrow. Address on facesheet is correct. Sunday Weekend CSW will just need to set up transport and notify family of time so that they can pick up pt wheelchair.   Larence Thone Jenny Reichmann Weekend CSW [phone]

## 2014-11-24 NOTE — Progress Notes (Signed)
Hypoglycemic Event  CBG: 49 at 1156 Repeat CBG :57 at 1209 Treatment: 15 GM gel  Symptoms: None  Follow-up CBGTime:1236 CBG Result:82  Possible Reasons for Event: Other: asymptomatic AOX4  Comments/MD notified: MD made aware/ Order for [facility]S CBG    Ulyssa Walthour sedo  Remember to initiate Hypoglycemia Order Set & complete

## 2014-11-25 DIAGNOSIS — R569 Unspecified convulsions: Secondary | ICD-10-CM

## 2014-11-25 LAB — GLUCOSE, CAPILLARY
GLUCOSE-CAPILLARY: 129 mg/dL — AB (ref 65–99)
Glucose-Capillary: 93 mg/dL (ref 65–99)

## 2014-11-25 MED ORDER — LEVETIRACETAM 750 MG PO TABS: 1500.0000 mg | ORAL_TABLET | Freq: Two times a day (BID) | ORAL | Status: DC

## 2014-11-25 NOTE — Discharge Summary (Signed)
Physician Discharge Summary  [name] [name] WUJ:[id number] DOB: [birth year] DOA: 11/20/2014  PCP: Helane Rima, MD  Admit date: 11/20/2014 Discharge date: 11/25/2014  Time spent: 35 minutes  Recommendations for Outpatient Follow-up:  1. Home with daughter  Discharge Diagnoses:  Active Problems:   Seizure   Chest pain   Acute respiratory failure   Encounter for orogastric (OG) tube placement   Status epilepticus   Discharge Condition: improved  Diet recommendation: regular  Filed Weights   11/23/14 0500 11/24/14 0533 11/25/14 0400  Weight: 47.8 kg (105 lb 6.1 oz) 55.4 kg (122 lb 2.2 oz) 53.1 kg (117 lb 1 oz)    History of present illness:  Pt is encephalopathic; therefore, this HPI is obtained from chart review. [name] [name] is a 59 y.o. F with PMH as outlined below. She apparently was at her PCP office on 11/20/14 when she suddenly had a grand mal seizure. EMS was dispatched and en route to ED, she seized again. On arrival to ED, she apparently had an additional focal seizure on the left side. She is on Keppra daily as an outpatient, unknown compliance.  In ED, her K was low at 3.4 and Mg low at 1.4. She was initially somnolent but arouseable to voice. By the time of our assessment, she was non-responsive with GCS of 4. She was subsequently intubated for airway protection.  Hospital Course:  59 year old female with a history of stroke and seizures presenting with breakthrough seizures. Patient is back to baseline. Continue keppra 1,500 mg BID. -not ambulatory at baseline  Procedures:  intubation  Consultations:  PCCM   neuro  Discharge Exam: Filed Vitals:   11/25/14 0951  BP: 123/65  Pulse: 72  Temp: 97.7 F (36.5 C)  Resp: 20    General: A+Ox3, NAD   Discharge Instructions    Current Discharge Medication List    CONTINUE these medications which have NOT CHANGED   Details  aspirin 325 MG tablet Take 325 mg by mouth daily.    atorvastatin  (LIPITOR) 40 MG tablet Take 40 mg by mouth daily at 12 noon.     feeding supplement, ENSURE COMPLETE, (ENSURE COMPLETE) LIQD Take 237 mLs by mouth 3 (three) times daily between meals. Qty: 90 Bottle, Refills: 5    iron polysaccharides (NIFEREX) 150 MG capsule Take 1 capsule (150 mg total) by mouth 2 (two) times daily. Qty: 60 capsule, Refills: 3    levETIRAcetam (KEPPRA) 1000 MG tablet Take 1,000 mg by mouth 2 (two) times daily.    lisinopril (PRINIVIL,ZESTRIL) 10 MG tablet Take 5 mg by mouth daily.     Multiple Vitamin (MULTIVITAMIN WITH MINERALS) TABS tablet Take 1 tablet by mouth daily. Qty: 30 tablet, Refills: 1    Potassium Gluconate 550 MG TABS Take 550 mg by mouth daily.       Allergies  Allergen Reactions  . Amoxicillin Itching  . Cephalexin Itching  . Penicillins Itching      The results of significant diagnostics from this hospitalization (including imaging, microbiology, ancillary and laboratory) are listed below for reference.    Significant Diagnostic Studies: Ct Head Wo Contrast  11/20/2014   CLINICAL DATA:  59 year old female with seizure today. History of seizure disorder.  EXAM: CT HEAD WITHOUT CONTRAST  TECHNIQUE: Contiguous axial images were obtained from the base of the skull through the vertex without intravenous contrast.  COMPARISON:  05/02/2014 and prior exams  FINDINGS: Severe bifrontal encephalomalacia again identified.  No acute intracranial abnormalities are identified, including  mass lesion or mass effect, hydrocephalus, extra-axial fluid collection, midline shift, hemorrhage, or acute infarction.  The visualized bony calvarium is unremarkable.  IMPRESSION: No evidence of acute intracranial abnormality.  Severe bifrontal encephalomalacia.   Electronically Signed   By: Margarette Canada M.D.   On: 11/20/2014 19:30   Dg Chest Port 1 View  11/22/2014   CLINICAL DATA:  Acute respiratory failure, check endotracheal tube placement  EXAM: PORTABLE CHEST - 1 VIEW   COMPARISON:  11/21/2014  FINDINGS: Cardiac shadow is stable. A nasogastric catheter is seen within the stomach. The endotracheal tube is noted 3.9 cm above the carina in satisfactory position. The lungs are clear bilaterally. No acute bony abnormality is seen.  IMPRESSION: No active disease.   Electronically Signed   By: Inez Catalina M.D.   On: 11/22/2014 08:00   Dg Chest Port 1 View  11/21/2014   CLINICAL DATA:  59 year old female with respiratory failure  EXAM: PORTABLE CHEST - 1 VIEW  COMPARISON:  Prior chest x-ray 11/20/2014  FINDINGS: The tip of the endotracheal tube is 3.3 cm above the carina. Nasogastric tube projects over the stomach. Cardiac and mediastinal contours remain within normal limits. The patient is rotated toward the left. The lungs remain clear. Unremarkable imaged upper abdominal bowel gas pattern. No acute osseous abnormality. Calcified left hilar lymph nodes.  IMPRESSION: 1. Interval placement of a nasogastric tube which appears well positioned overlying the gastric bubble. 2. The tip of the endotracheal tube is in good position 3.3 cm above the carina. 3. The lungs remain clear.   Electronically Signed   By: Jacqulynn Cadet M.D.   On: 11/21/2014 08:07   Portable Chest Xray  11/20/2014   CLINICAL DATA:  Seizure, post intubation.  EXAM: PORTABLE CHEST - 1 VIEW  COMPARISON:  Earlier this day at 1743 hour  FINDINGS: The endotracheal tube is 2.6 cm from the carina. The cardiomediastinal contours are normal. The lungs are clear. Pulmonary vasculature is normal. No consolidation, pleural effusion, or pneumothorax. Degenerative change of both shoulders. No acute osseous abnormalities are seen.  IMPRESSION: Endotracheal tube 2.6 cm from the carina.  Clear lungs.   Electronically Signed   By: Jeb Levering M.D.   On: 11/20/2014 22:03   Dg Chest Port 1 View  11/20/2014   CLINICAL DATA:  Seizures, unresponsive  EXAM: PORTABLE CHEST - 1 VIEW  COMPARISON:  05/01/2014  FINDINGS: The heart  size and mediastinal contours are within normal limits. Both lungs are clear. The visualized skeletal structures are unremarkable.  IMPRESSION: No active disease.   Electronically Signed   By: Skipper Cliche M.D.   On: 11/20/2014 17:58   Dg Abd Portable 1v  11/21/2014   CLINICAL DATA:  Orogastric tube placement  EXAM: PORTABLE ABDOMEN - 1 VIEW  COMPARISON:  None.  FINDINGS: The enteric tube extends well into the stomach with tip in the region of the distal gastric body.  IMPRESSION: Orogastric tube extends into the stomach.   Electronically Signed   By: Andreas Newport M.D.   On: 11/21/2014 00:20    Microbiology: Recent Results (from the past 240 hour(s))  MRSA PCR Screening     Status: None   Collection Time: 11/20/14 11:54 PM  Result Value Ref Range Status   MRSA by PCR NEGATIVE NEGATIVE Final    Comment:        The GeneXpert MRSA Assay (FDA approved for NASAL specimens only), is one component of a comprehensive MRSA colonization surveillance program. It  is not intended to diagnose MRSA infection nor to guide or monitor treatment for MRSA infections.      Labs: Basic Metabolic Panel:  Recent Labs Lab 11/20/14 1753 11/20/14 2000 11/21/14 0016 11/22/14 0305 11/23/14 0232 11/24/14 0428  NA 143  --  142 140 141 141  K 3.4*  --  3.8 3.8 3.5 4.0  CL 108  --  110 108 110 111  CO2 25  --  26 26 25 26   GLUCOSE 71  --  89 72 93 92  BUN 31*  --  24* 21* 16 15  CREATININE 0.72  --  0.55 0.73 0.56 0.52  CALCIUM 9.5  --  9.1 9.0 8.7* 8.8*  MG  --  1.4* 2.6*  --   --   --   PHOS  --  2.9 2.5  --   --   --    Liver Function Tests:  Recent Labs Lab 11/22/14 0305  AST 36  ALT 70*  ALKPHOS 71  BILITOT 0.9  PROT 5.6*  ALBUMIN 2.9*   No results for input(s): LIPASE, AMYLASE in the last 168 hours. No results for input(s): AMMONIA in the last 168 hours. CBC:  Recent Labs Lab 11/20/14 1753 11/21/14 0016 11/22/14 0305 11/24/14 0428  WBC 6.8 8.5 6.5 4.5  NEUTROABS 5.0   --  4.7  --   HGB 10.9* 10.1* 9.5* 8.4*  HCT 32.6* 29.7* 27.8* 25.5*  MCV 95.6 94.3 93.3 95.9  PLT PLATELET CLUMPS NOTED ON SMEAR, COUNT APPEARS ADEQUATE PLATELET CLUMPS NOTED ON SMEAR, COUNT APPEARS DECREASED PLATELET CLUMPS NOTED ON SMEAR, UNABLE TO ESTIMATE PLATELET CLUMPING, SUGGEST RECOLLECTION OF SAMPLE IN CITRATE TUBE.   Cardiac Enzymes:  Recent Labs Lab 11/20/14 1753  TROPONINI <0.03   BNP: BNP (last 3 results)  Recent Labs  05/02/14 1127  BNP 313.9*    ProBNP (last 3 results) No results for input(s): PROBNP in the last 8760 hours.  CBG:  Recent Labs Lab 11/24/14 1150 11/24/14 1209 11/24/14 1236 11/24/14 1637 11/25/14 0628  GLUCAP 49* 57* 82 99 129*       Signed:  Gabrielle Wakeland  [facility] 11/25/2014, 11:30 AM

## 2014-11-25 NOTE — Progress Notes (Signed)
Pt is being discharged home. Discharge instructions were given to patient and envelop was given to transporter. Pt daughter lauren wickersham was called to pick up her mother wheelchair at [phone].

## 2014-11-25 NOTE — Progress Notes (Signed)
CSW informed that pt will be able to DC this morning. Placed PTAR packet on the chart  Patient will discharge to home Anticipated discharge date:11/25/14 Transportation by [facility]- [phone] press ext 1 then ext [phone], RN to call when pt ready for DC  CSW signing off.  Domenica Reamer, [place] Social Worker [phone]

## 2014-12-26 ENCOUNTER — Ambulatory Visit: Payer: Medicare HMO | Admitting: Certified Nurse Midwife

## 2015-01-08 ENCOUNTER — Encounter: Payer: Medicare HMO | Attending: Family Medicine | Admitting: *Deleted

## 2015-01-08 DIAGNOSIS — E119 Type 2 diabetes mellitus without complications: Secondary | ICD-10-CM | POA: Diagnosis not present

## 2015-01-08 DIAGNOSIS — Z713 Dietary counseling and surveillance: Secondary | ICD-10-CM | POA: Diagnosis not present

## 2015-01-08 NOTE — Patient Instructions (Signed)
[name] is good choice with the flavor all mixed in. Not the kind with fruit and syrup to mix in. [name] [name] is OK, try to restrict to one time per week Increase protein in order to gain weight.  You are doing a good job with your food. It is a challenge to try to gain weight without increasing your blood sugar. It has been a pleasure meeting with you.

## 2015-01-10 ENCOUNTER — Encounter: Payer: Self-pay | Admitting: *Deleted

## 2015-01-10 NOTE — Progress Notes (Signed)
Diabetes Self-Management Education  Visit Type: First/Initial  Appt. Start Time: 1400 Appt. End Time: 0383  01/10/2015  [name]. [name] [name], identified by name and date of birth, is a 59 y.o. female with a diagnosis of Diabetes: Type 2. Patient presents with primary diagnosis of T2DM. [name] [name] is in wheel chair and arrives to our office with the assist of [employer] driver. She is very frail and underweight. She sits in her wheel chair with cushion to her buttocks. She states she has a pillow for her chair put it doesn't get used. I am concerned about her skin integrity. In an effort to understand how I can best help [name] [name] with her diabetes and weight I learn that she lives with her daughter, son-in-law and 37 yr old grandson. There is much tension in the home environment. She eats her meals in her room, not by her choice. When asked what she eats, "what I'm given". I asked her if there is somewhere else she would rather live.... "in a nursing home". I note that in Dr. Bonnielee Haff office notes similar observations were made. I contacted Dr. Bonnielee Haff office and shared my concerns.  ASSESSMENT  Last menstrual period 06/24/2013. There is no weight on file to calculate BMI.      Diabetes Self-Management Education - 01/08/15 1438    Visit Information   Visit Type First/Initial   Initial Visit   Diabetes Type Type 2   Date Diagnosed 2013   Health Coping   How would you rate your overall health? Fair   Psychosocial Assessment   Patient Belief/Attitude about Diabetes Motivated to manage diabetes   Self-care barriers Other (comment)  patient is wheel chair bound, lives with daughter and depends on [employer] for transportation. As a result of CVA  patient is a poor historian.    Self-management support Doctor's office;CDE visits  She feels family is not supportive ofher needs   Other persons present Patient   Patient Concerns Weight Control;Glycemic Control   Special Needs Other (comment)  Patient  presents alone. It would have been beneficial for a family member was with her.   Preferred Learning Style No preference indicated   Learning Readiness Change in progress   How often do you need to have someone help you when you read instructions, pamphlets, or other written materials from your doctor or pharmacy? 3 - Sometimes   What is the last grade level you completed in school? 4 yrs college [employer]   Complications   Last HgB A1C per patient/outside source 5.9 %   How often do you check your blood sugar? 0 times/day (not testing)   Dietary Intake   Breakfast scrambled eggs, hamburger bun with butter, salisbery steak, cinnamon apple sauce, water   Lunch hamberger, potato or pasta (1C ) , green vegetable, fruit   Dinner meat, starch, vegetable, fruit   Beverage(s) water   Exercise   Exercise Type Other (comment)  wheel chair   How many days per week to you exercise? 0   How many minutes per day do you exercise? 0   Total minutes per week of exercise 0   Patient Education   Previous Diabetes Education Yes (please comment)  dietitian   Disease state  Factors that contribute to the development of diabetes   Nutrition management  Role of diet in the treatment of diabetes and the relationship between the three main macronutrients and blood glucose level;Meal options for control of blood glucose level and chronic complications.  Monitoring --  Patient not physically able to test. No one else available to assist.   Chronic complications Relationship between chronic complications and blood glucose control;Identified and discussed with patient  current chronic complications  Patient has pillow for wheelchair put it is not regularly used by person putting her in wheel chair. Concern to skin breakdown.   Psychosocial adjustment Worked with patient to identify barriers to care and solutions;Role of stress on diabetes  Patient would like to go to a nursing home to receive better chair than she is  receiving in her family's home   Individualized Goals (developed by patient)   Nutrition General guidelines for healthy choices and portions discussed   Outcomes   Expected Outcomes Demonstrated limited interest in learning.  Expect minimal changes;Other (comment)  From the information I received from [name]. [name] I doubt that any change will occur. She noted her daughter was off work this date but did not come with her. I feel that her health and social needs would be bettter serviced in a care facility as requested   Future DMSE PRN   Program Status Completed   Subsequent Visit   Since your last visit have you continued or begun to take your medications as prescribed? Yes      Individualized Plan for Diabetes Self-Management Training:   Learning Objective:  Patient will have a greater understanding of diabetes self-management. Patient education plan is to attend individual and/or group sessions per assessed needs and concerns.   Plan:   Patient Instructions  [place] Yogurt is good choice with the flavor all mixed in. Not the kind with fruit and syrup to mix in. Hansel Starling is OK, try to restrict to one time per week Increase protein in order to gain weight.  You are doing a good job with your food. It is a challenge to try to gain weight without increasing your blood sugar. It has been a pleasure meeting with you.    Expected Outcomes:  Demonstrated limited interest in learning.  Expect minimal changes, Other (comment) (From the information I received from [name] [name] I doubt that any change will occur. She noted her daughter was off work this date but did not come with her. With the patients inability to fully remember it would be beneficial if a family member or caregiver would accompany her to her visit. I feel that her health and social needs would be better served in a care facility as requested by the patient)  Education material provided: Living Well with Diabetes, A1C conversion sheet,  Meal plan card, My Plate and Snack sheet  If problems or questions, patient to contact team via:  Phone  Future DSME appointment: PRN

## 2015-02-07 ENCOUNTER — Other Ambulatory Visit: Payer: Self-pay | Admitting: *Deleted

## 2015-02-07 ENCOUNTER — Non-Acute Institutional Stay (SKILLED_NURSING_FACILITY): Payer: Medicare HMO | Admitting: Adult Health

## 2015-02-07 ENCOUNTER — Encounter: Payer: Self-pay | Admitting: Adult Health

## 2015-02-07 DIAGNOSIS — G40901 Epilepsy, unspecified, not intractable, with status epilepticus: Secondary | ICD-10-CM | POA: Diagnosis not present

## 2015-02-07 DIAGNOSIS — E1149 Type 2 diabetes mellitus with other diabetic neurological complication: Secondary | ICD-10-CM | POA: Insufficient documentation

## 2015-02-07 DIAGNOSIS — G9340 Encephalopathy, unspecified: Secondary | ICD-10-CM

## 2015-02-07 DIAGNOSIS — I10 Essential (primary) hypertension: Secondary | ICD-10-CM | POA: Diagnosis not present

## 2015-02-07 DIAGNOSIS — E785 Hyperlipidemia, unspecified: Secondary | ICD-10-CM

## 2015-02-07 DIAGNOSIS — D649 Anemia, unspecified: Secondary | ICD-10-CM | POA: Diagnosis not present

## 2015-02-07 DIAGNOSIS — E43 Unspecified severe protein-calorie malnutrition: Secondary | ICD-10-CM

## 2015-02-07 MED ORDER — NUTRITIONAL SUPPLEMENT PO LIQD
90.0000 mL | Freq: Three times a day (TID) | ORAL | Status: DC
Start: 2015-02-07 — End: 2015-07-04

## 2015-02-07 MED ORDER — TRAMADOL HCL 50 MG PO TABS: ORAL_TABLET | ORAL | Status: DC

## 2015-02-07 NOTE — Progress Notes (Signed)
Patient ID: [name] [name], female   DOB: 1956-04-15, 59 y.o.   MRN: [id number]    Facility: [facility]      Allergies  Allergen Reactions  . Amoxicillin Itching  . Cephalexin Itching  . Penicillins Itching    Chief Complaint  Patient presents with  . Acute Visit    follow up transfer     HPI:  She was living at home; was unable to care for herself; is here for short term rehab to improve upon her ability for independence with her adl's . Her goal is to return back home. She denies any recent falls. She states that she has been weak. She is not voicing any complaints of pain or discomfort. There are no nursing concerns at this time.    Past Medical History  Diagnosis Date  . Uterine fibroid   . Gastritis   . Hiatal hernia   . Hemorrhoids   . Anemia   . Stroke ([place]) 12 1989  . Dyslipidemia   . Hypertension   . Seizure disorder ([place])   . Carpal tunnel syndrome   . Depression   . Osteoporosis   . Urinary incontinence   . Iron deficiency anemia 2008    negative EGD and colonoscopy  . Diabetes mellitus   . Seizures ([place])   . Hyperlipidemia     Past Surgical History  Procedure Laterality Date  . Colonoscopy  2008  . Upper gastrointestinal endoscopy  2008  . Tracheostomy    . Breast surgery  1977  . Tubal ligation    . Cesarean section      x2  . Tracheostomy closure      Family History  Problem Relation Age of Onset  . Heart disease Paternal Grandfather   . Hypertension Paternal Grandfather   . Prostate cancer Paternal Uncle   . Ovarian cancer Mother   . Diabetes Cousin   . Colon cancer Neg Hx   . Hypertension Maternal Grandmother   . Stroke Maternal Grandmother   . Diabetes Brother   . Heart attack Brother     Social History   Social History  . Marital Status: Divorced    Spouse Name: N/A  . Number of Children: 2  . Years of Education: N/A   Occupational History  . Disabled    Social History Main Topics  . Smoking status: Never  Smoker   . Smokeless tobacco: Never Used  . Alcohol Use: No  . Drug Use: No  . Sexual Activity: Not on file   Other Topics Concern  . Not on file   Social History Narrative     VITAL SIGNS BP 122/89 mmHg  Pulse 60  Temp(Src) 97.2 F (36.2 C)  Resp 20  Ht $R'5\' 7"'pF$  (1.702 m)  Wt 117 lb (53.071 kg)  BMI 18.32 kg/m2  SpO2 97%  LMP 06/24/2013  Patient's Medications  New Prescriptions   No medications on file  Previous Medications   ASPIRIN 325 MG TABLET    Take 325 mg by mouth daily.   ATORVASTATIN (LIPITOR) 40 MG TABLET    Take 40 mg by mouth daily at 12 noon.    IRON POLYSACCHARIDES (NIFEREX) 150 MG CAPSULE    Take 1 capsule (150 mg total) by mouth 2 (two) times daily.   LEVETIRACETAM (KEPPRA) 1000 MG TABLET    Take 1,000 mg by mouth 2 (two) times daily.   LISINOPRIL (PRINIVIL,ZESTRIL) 5 MG TABLET    Take 5 mg by mouth daily.  MULTIPLE VITAMIN (MULTIVITAMIN WITH MINERALS) TABS TABLET    Take 1 tablet by mouth daily.   TRAMADOL (ULTRAM) 50 MG TABLET    Take by mouth every 8 (eight) hours as needed.  Modified Medications   No medications on file  Discontinued Medications     SIGNIFICANT DIAGNOSTIC EXAMS  11-20-14: chest x-ray: No active disease.  11-20-14: ct of head: No evidence of acute intracranial abnormality. Severe bifrontal encephalomalacia.  11-23-14: EEG: This EEG was abnormal with moderately severe continuous unspecific slowing of cerebral activity, consistent with sedation with propofol. However, this pattern of slowing can also be seen with metabolic and toxic encephalopathies as well as with postictal states. No evidence of seizure activity was recorded during this study.   LABS REVIEWED:   11-06-14: hgb a1c 5.8; tsh 2.390; free t4: 0.97 11-22-14: wbc 6.5 ;hgb 9.5; hct 27.8; mcv 93.3; plt: clumped; glucose 72; bun 21; creat 0.73; k+ 3.8; na++140; alt 70; ast 36; alk phos 71; albumin 2.9  12-28-14: chol 164; ldl 71; trig 46 hdl 64      Review of Systems    Constitutional: Negative for appetite change and fatigue.       Has a history of unintentional weight loss   HENT: Negative for congestion.   Respiratory: Negative for cough, chest tightness and shortness of breath.   Cardiovascular: Negative for chest pain, palpitations and leg swelling.  Gastrointestinal: Negative for nausea, abdominal pain, diarrhea and constipation.  Musculoskeletal: Negative for myalgias and arthralgias.       Has weakness present   Skin: Negative for pallor.  Neurological: Negative for dizziness.  Psychiatric/Behavioral: The patient is not nervous/anxious.       Physical Exam  Constitutional: She is oriented to person, place, and time. No distress.  Frail   Eyes: Conjunctivae are normal.  Neck: Neck supple. No JVD present. No thyromegaly present.  Cardiovascular: Normal rate, regular rhythm and intact distal pulses.   Respiratory: Effort normal and breath sounds normal. No respiratory distress. She has no wheezes.  GI: Soft. Bowel sounds are normal. She exhibits no distension. There is no tenderness.  Musculoskeletal: She exhibits no edema.  Able to move all extremities   Lymphadenopathy:    She has no cervical adenopathy.  Neurological: She is alert and oriented to person, place, and time.  Skin: Skin is warm and dry. She is not diaphoretic.  Psychiatric: She has a normal mood and affect.       ASSESSMENT/ PLAN:  1. Hypertension: will continue lisinopril 5 mg daily   2. Dyslipidemia: will continue lipitor 40 mg daily ldl is 71  3. Anemia: will continue niferex  150 mg twice daily her hgb is 9.5  4. CVA: is neurologically stable will continue asa 325 mg daily   5. Seizure: no reports of seizure activity present; will continue keppra 1000 mg twice daily   6. Diabetes: is presently not on medications; is on asa; statin and asa hgb a1c is 5.8  7. Physical deconditioning: will continue therapy as directed to improve upon her gait; balance;  strength and indpendence with adl's   8. Protein calorie malnutrition: will begin med pass 90 cc three times daily her albumin is 2.9    Time spent with patient  50  minutes >50% time spent counseling; reviewing medical record; tests; labs; and developing future plan of care      Ok Edwards NP [facility]  Contact [phone] Monday through Friday 8am- 5pm  After hours call  [phone]

## 2015-02-07 NOTE — Telephone Encounter (Signed)
[facility] 

## 2015-02-08 ENCOUNTER — Other Ambulatory Visit: Payer: Self-pay | Admitting: *Deleted

## 2015-02-08 MED ORDER — TRAMADOL HCL 50 MG PO TABS: ORAL_TABLET | ORAL | Status: DC

## 2015-02-08 NOTE — Telephone Encounter (Signed)
[facility] 

## 2015-02-11 ENCOUNTER — Encounter: Payer: Self-pay | Admitting: Internal Medicine

## 2015-02-11 ENCOUNTER — Non-Acute Institutional Stay (SKILLED_NURSING_FACILITY): Payer: Medicare HMO | Admitting: Internal Medicine

## 2015-02-11 DIAGNOSIS — R7303 Prediabetes: Secondary | ICD-10-CM | POA: Diagnosis not present

## 2015-02-11 DIAGNOSIS — E43 Unspecified severe protein-calorie malnutrition: Secondary | ICD-10-CM

## 2015-02-11 DIAGNOSIS — G40901 Epilepsy, unspecified, not intractable, with status epilepticus: Secondary | ICD-10-CM

## 2015-02-11 DIAGNOSIS — I639 Cerebral infarction, unspecified: Secondary | ICD-10-CM

## 2015-02-11 DIAGNOSIS — E785 Hyperlipidemia, unspecified: Secondary | ICD-10-CM

## 2015-02-11 DIAGNOSIS — D649 Anemia, unspecified: Secondary | ICD-10-CM

## 2015-02-11 DIAGNOSIS — R531 Weakness: Secondary | ICD-10-CM | POA: Diagnosis not present

## 2015-02-11 DIAGNOSIS — I10 Essential (primary) hypertension: Secondary | ICD-10-CM

## 2015-02-11 DIAGNOSIS — IMO0002 Reserved for concepts with insufficient information to code with codable children: Secondary | ICD-10-CM

## 2015-02-11 DIAGNOSIS — Z8673 Personal history of transient ischemic attack (TIA), and cerebral infarction without residual deficits: Secondary | ICD-10-CM | POA: Diagnosis not present

## 2015-02-11 NOTE — Progress Notes (Signed)
Patient ID: [name] [name], female   DOB: 09-26-1955, 59 y.o.   MRN: [id number]    HISTORY AND PHYSICAL   DATE: 02/11/15  Location:  [facility]    Place of Service: SNF (31)   Extended Emergency Contact Information Primary Emergency Contact: Llewellyn,Lauren Address: [place], [place] Phone: [phone] Relation: Daughter  Advanced Directive information   FULL CODE  Chief Complaint  Patient presents with  . New Admit To SNF    HPI:  59 yo female seen today as a new admission into SNF from home. She will be a long term resident.  Hx CVA - she reports generalized weakness and is confined to w/c. She takes ASA daily and statin. BP controlled with lisinopril. She takes tramadol for pain prn  Prediabetes - CBG checked 2x per week and range 60-70s. She takes no meds. Hx DM.  Seizure d/o - controlled on keppra  Protein calorie malnutrition - cont nutritional supplement and MVI  Anemia - stable on iron  Past Medical History  Diagnosis Date  . Uterine fibroid   . Gastritis   . Hiatal hernia   . Hemorrhoids   . Anemia   . Stroke ([place]) 12 1989  . Dyslipidemia   . Hypertension   . Seizure disorder ([place])   . Carpal tunnel syndrome   . Depression   . Osteoporosis   . Urinary incontinence   . Iron deficiency anemia 2008    negative EGD and colonoscopy  . Diabetes mellitus   . Seizures ([place])   . Hyperlipidemia     Past Surgical History  Procedure Laterality Date  . Colonoscopy  2008  . Upper gastrointestinal endoscopy  2008  . Tracheostomy    . Breast surgery  1977  . Tubal ligation    . Cesarean section      x2  . Tracheostomy closure      Patient Care Team: Helane Rima, MD as PCP - General (Family Medicine)  Social History   Social History  . Marital Status: Divorced    Spouse Name: N/A  . Number of Children: 2  . Years of Education: N/A   Occupational History  . Disabled     Social History Main Topics  . Smoking status: Never Smoker   . Smokeless tobacco: Never Used  . Alcohol Use: No  . Drug Use: No  . Sexual Activity: Not on file   Other Topics Concern  . Not on file   Social History Narrative   Size and skilled nursing facility secondary to multiple previous strokes and lack of family to care for her at home     reports that she has never smoked. She has never used smokeless tobacco. She reports that she does not drink alcohol or use illicit drugs.  Family History  Problem Relation Age of Onset  . Heart disease Paternal Grandfather   . Hypertension Paternal Grandfather   . Prostate cancer Paternal Uncle   . Ovarian cancer Mother   . Diabetes Cousin   . Colon cancer Neg Hx   . Hypertension Maternal Grandmother   . Stroke Maternal Grandmother   . Diabetes Brother   . Heart attack Brother    Family Status  Relation Status Death Age  . Paternal Grandfather Deceased   . Paternal Uncle Deceased   . Mother Deceased 63    Aids  . Maternal Grandmother Deceased   .  Father Deceased 61    MVA  . Maternal Grandfather Deceased     cancer  . Paternal Grandmother Deceased   . Brother Deceased     unknown  . Brother Deceased     Immunization History  Administered Date(s) Administered  . Influenza,inj,Quad PF,36+ Mos 05/03/2014  . Influenza-Unspecified 12/26/2013    Allergies  Allergen Reactions  . Amoxicillin Itching  . Cephalexin Itching  . Penicillins Itching    Medications: Patient's Medications  New Prescriptions   No medications on file  Previous Medications   ASPIRIN 325 MG TABLET    Take 325 mg by mouth daily.   ATORVASTATIN (LIPITOR) 40 MG TABLET    Take 40 mg by mouth daily at 12 noon.    IRON POLYSACCHARIDES (NIFEREX) 150 MG CAPSULE    Take 1 capsule (150 mg total) by mouth 2 (two) times daily.   LEVETIRACETAM (KEPPRA) 1000 MG TABLET    Take 1,000 mg by mouth 2 (two) times daily.   LISINOPRIL (PRINIVIL,ZESTRIL) 5 MG  TABLET    Take 5 mg by mouth daily.   MULTIPLE VITAMIN (MULTIVITAMIN WITH MINERALS) TABS TABLET    Take 1 tablet by mouth daily.   NUTRITIONAL SUPPLEMENT LIQD    Take 90 mLs by mouth 3 (three) times daily.   TRAMADOL (ULTRAM) 50 MG TABLET    Take one tablet by mouth every 8 hours as needed for pain  Modified Medications   No medications on file  Discontinued Medications   No medications on file    Review of Systems  Constitutional: Positive for fatigue. Negative for fever, chills, diaphoresis, activity change and appetite change.  HENT: Negative for ear pain and sore throat.   Eyes: Negative for visual disturbance.  Respiratory: Negative for cough, chest tightness and shortness of breath.   Cardiovascular: Negative for chest pain, palpitations and leg swelling.  Gastrointestinal: Negative for nausea, vomiting, abdominal pain, diarrhea, constipation and blood in stool.  Genitourinary: Negative for dysuria.  Musculoskeletal: Positive for arthralgias.  Neurological: Positive for weakness. Negative for dizziness, tremors, numbness and headaches.  Psychiatric/Behavioral: Negative for sleep disturbance. The patient is not nervous/anxious.     Filed Vitals:   02/11/15 2313  BP: 120/64  Pulse: 64  Temp: 97.1 F (36.2 C)  SpO2: 97%   There is no weight on file to calculate BMI.  Physical Exam  Constitutional: She is oriented to person, place, and time. She appears well-developed.  Sitting in w/c in NAD, frail appearing  HENT:  Mouth/Throat: Oropharynx is clear and moist. No oropharyngeal exudate.  Eyes: Pupils are equal, round, and reactive to light. No scleral icterus.  Neck: Neck supple. Carotid bruit is not present. No tracheal deviation present. No thyromegaly present.  Cardiovascular: Normal rate, regular rhythm and intact distal pulses.  Exam reveals no gallop and no friction rub.   Murmur (1/6 SEM) heard. No LE edema b/l. no calf TTP.   Pulmonary/Chest: Effort normal and  breath sounds normal. No stridor. No respiratory distress. She has no wheezes. She has no rales.  Abdominal: Soft. Bowel sounds are normal. She exhibits no distension and no mass. There is no hepatomegaly. There is no tenderness. There is no rebound and no guarding.  Musculoskeletal: She exhibits edema.  Muscle atrophy R>L leg  Lymphadenopathy:    She has no cervical adenopathy.  Neurological: She is alert and oriented to person, place, and time. She displays atrophy.  Skin: Skin is warm and dry. No rash noted.  Psychiatric:  She has a normal mood and affect. Her behavior is normal. Judgment and thought content normal.     Labs reviewed: Admission on 11/20/2014, Discharged on 11/25/2014  No results displayed because visit has over 200 results.     CBC Latest Ref Rng 11/24/2014 11/22/2014 11/21/2014  WBC 4.0 - 10.5 K/uL 4.5 6.5 8.5  Hemoglobin 12.0 - 15.0 g/dL 8.4(L) 9.5(L) 10.1(L)  Hematocrit 36.0 - 46.0 % 25.5(L) 27.8(L) 29.7(L)  Platelets 150 - 400 K/uL PLATELET CLUMPING, SUGGEST RECOLLECTION OF SAMPLE IN CITRATE TUBE. PLATELET CLUMPS NOTED ON SMEAR, UNABLE TO ESTIMATE PLATELET CLUMPS NOTED ON SMEAR, COUNT APPEARS DECREASED    CMP Latest Ref Rng 11/24/2014 11/23/2014 11/22/2014  Glucose 65 - 99 mg/dL 92 93 72  BUN 6 - 20 mg/dL 15 16 21(H)  Creatinine 0.44 - 1.00 mg/dL 0.52 0.56 0.73  Sodium 135 - 145 mmol/L 141 141 140  Potassium 3.5 - 5.1 mmol/L 4.0 3.5 3.8  Chloride 101 - 111 mmol/L 111 110 108  CO2 22 - 32 mmol/L 26 25 26   Calcium 8.9 - 10.3 mg/dL 8.8(L) 8.7(L) 9.0  Total Protein 6.5 - 8.1 g/dL - - 5.6(L)  Total Bilirubin 0.3 - 1.2 mg/dL - - 0.9  Alkaline Phos 38 - 126 U/L - - 71  AST 15 - 41 U/L - - 36  ALT 14 - 54 U/L - - 70(H)    Lab Results  Component Value Date   HGBA1C 5.9* 05/02/2014     No results found.   Assessment/Plan   ICD-9-CM ICD-10-CM   1. History of stroke V12.54 Z86.73   2. Weakness due to cerebrovascular accident (CVA) ([place]) 434.91 I63.9    780.79  R53.1   3. Essential hypertension, benign - stable 401.1 I10   4. Prediabetes - stable 790.29 R73.03   5. Status epilepticus ([place]) - controlled 345.3 G40.901   6. HLD (hyperlipidemia) - stable 272.4 E78.5   7. Protein-calorie malnutrition, severe ([place]) - stable 262 E43   8. Normocytic anemia - stable 285.9 D64.9     Cont CBGs 2 times weekly  Cont nutritional supplement as ordered  Cont current meds as ordered  PT/OT/ST as indicated  GOAL:long term care. Communicated with pt and nursing.  Will follow  Jhonathan Desroches S. Perlie Gold  [facility] [address] [place], [place] [address] [phone] Cell (Monday-Friday 8 AM - 5 PM) [phone] After 5 PM and follow prompts

## 2015-03-11 ENCOUNTER — Encounter: Payer: Self-pay | Admitting: Internal Medicine

## 2015-03-11 ENCOUNTER — Non-Acute Institutional Stay (SKILLED_NURSING_FACILITY): Payer: Medicare HMO | Admitting: Internal Medicine

## 2015-03-11 DIAGNOSIS — I10 Essential (primary) hypertension: Secondary | ICD-10-CM | POA: Diagnosis not present

## 2015-03-11 DIAGNOSIS — J069 Acute upper respiratory infection, unspecified: Secondary | ICD-10-CM

## 2015-03-11 DIAGNOSIS — E785 Hyperlipidemia, unspecified: Secondary | ICD-10-CM | POA: Diagnosis not present

## 2015-03-11 DIAGNOSIS — D649 Anemia, unspecified: Secondary | ICD-10-CM

## 2015-03-11 NOTE — Assessment & Plan Note (Signed)
Controlled on Lisinopril 5 mg daily  

## 2015-03-11 NOTE — Assessment & Plan Note (Signed)
Stable on current med; LDL 71; plan - cont lipitor 40 mg

## 2015-03-11 NOTE — Progress Notes (Signed)
MRN: [id number] Name: Tabithia Oguinn  Sex: female Age: 59 y.o. DOB: [birth year]  [place] #: Karren Burly Facility/Room:121 Level Of Care: SNF Provider: Inocencio Homes D Emergency Contacts: Extended Emergency Contact Information Primary Emergency Contact: Baumbach,Lauren Address: [place], [place] Phone: [phone] Relation: Daughter  Code Status:   Allergies: Amoxicillin; Cephalexin; and Penicillins  Chief Complaint  Patient presents with  . Medical Management of Chronic Issues    HPI: Patient is 59 y.o. female with anemia, s/p stroke, anemia, HLD, HTN, seizure dx, depression, DM2 who is being seen for routine issues of HLD, anemia, HTN and acute c/o URI.   Past Medical History  Diagnosis Date  . Uterine fibroid   . Gastritis   . Hiatal hernia   . Hemorrhoids   . Anemia   . Stroke ([place]) 12 1989  . Dyslipidemia   . Hypertension   . Seizure disorder ([place])   . Carpal tunnel syndrome   . Depression   . Osteoporosis   . Urinary incontinence   . Iron deficiency anemia 2008    negative EGD and colonoscopy  . Diabetes mellitus   . Seizures ([place])   . Hyperlipidemia     Past Surgical History  Procedure Laterality Date  . Colonoscopy  2008  . Upper gastrointestinal endoscopy  2008  . Tracheostomy    . Breast surgery  1977  . Tubal ligation    . Cesarean section      x2  . Tracheostomy closure        Medication List       This list is accurate as of: 03/11/15  8:18 PM.  Always use your most recent med list.               aspirin 325 MG tablet  Take 325 mg by mouth daily.     atorvastatin 40 MG tablet  Commonly known as:  LIPITOR  Take 40 mg by mouth daily at 12 noon.     iron polysaccharides 150 MG capsule  Commonly known as:  NIFEREX  Take 1 capsule (150 mg total) by mouth 2 (two) times daily.     levETIRAcetam 1000 MG tablet  Commonly known as:  KEPPRA  Take 1,000 mg by mouth 2 (two) times daily.     lisinopril 5 MG tablet  Commonly known as:  PRINIVIL,ZESTRIL  Take 5 mg by mouth daily.     multivitamin with minerals Tabs tablet  Take 1 tablet by mouth daily.     NUTRITIONAL SUPPLEMENT Liqd  Take 90 mLs by mouth 3 (three) times daily.     traMADol 50 MG tablet  Commonly known as:  ULTRAM  Take one tablet by mouth every 8 hours as needed for pain        No orders of the defined types were placed in this encounter.    Immunization History  Administered Date(s) Administered  . Influenza,inj,Quad PF,36+ Mos 05/03/2014  . Influenza-Unspecified 12/26/2013    Social History  Substance Use Topics  . Smoking status: Never Smoker   . Smokeless tobacco: Never Used  . Alcohol Use: No    Review of Systems  DATA OBTAINED: from patient, nurse GENERAL:  no fevers, fatigue, appetite changes SKIN: No itching, rash HEENT: No complaint RESPIRATORY: No cough, wheezing, SOB, + runny nose, no ST CARDIAC: No chest pain, palpitations, lower extremity edema  GI: No abdominal pain, No N/V/D or constipation,  No heartburn or reflux  GU: No dysuria, frequency or urgency, or incontinence  MUSCULOSKELETAL: No unrelieved bone/joint pain NEUROLOGIC: No headache, dizziness  PSYCHIATRIC: No overt anxiety or sadness  Filed Vitals:   03/11/15 1254  BP: 122/77  Pulse: 76  Temp: 98 F (36.7 C)  Resp: 20    Physical Exam  GENERAL APPEARANCE: Alert, conversant, No acute distress  SKIN: No diaphoresis rash, or wounds HEENT: clear nasal d/c, pharynx not red or swollen RESPIRATORY: Breathing is even, unlabored. Lung sounds are clear   CARDIOVASCULAR: Heart RRR no murmurs, rubs or gallops. No peripheral edema  GASTROINTESTINAL: Abdomen is soft, non-tender, not distended w/ normal bowel sounds.  GENITOURINARY: Bladder non tender, not distended  MUSCULOSKELETAL: No abnormal joints or musculature NEUROLOGIC: Cranial nerves 2-12 grossly intact; R side weakness but use R UE PSYCHIATRIC: Mood and  affect appropriate to situation, no behavioral issues  Patient Active Problem List   Diagnosis Date Noted  . Type II diabetes mellitus with neurological manifestations ([place]) 02/07/2015  . Essential hypertension, benign 02/07/2015  . Status epilepticus ([place])   . Encounter for orogastric (OG) tube placement   . Acute respiratory failure ([place])   . Chest pain   . Encephalopathy 05/02/2014  . Septic shock ([place]) 05/02/2014  . Bradycardia 05/01/2014  . Normocytic anemia 05/01/2014  . Elevated transaminase level 05/01/2014  . Protein-calorie malnutrition, severe ([place]) 02/23/2014  . HLD (hyperlipidemia) 02/22/2014  . History of stroke 06/09/2007  . EXTERNAL HEMORRHOIDS 03/22/2007  . HIATAL HERNIA 03/22/2007    CBC    Component Value Date/Time   WBC 4.5 11/24/2014 0428   RBC 2.66* 11/24/2014 0428   RBC 3.41* 07/25/2013 0830   HGB 8.4* 11/24/2014 0428   HCT 25.5* 11/24/2014 0428   PLT  11/24/2014 0428    PLATELET CLUMPING, SUGGEST RECOLLECTION OF SAMPLE IN CITRATE TUBE.   MCV 95.9 11/24/2014 0428   LYMPHSABS 1.3 11/22/2014 0305   MONOABS 0.4 11/22/2014 0305   EOSABS 0.1 11/22/2014 0305   BASOSABS 0.0 11/22/2014 0305    CMP     Component Value Date/Time   NA 141 11/24/2014 0428   K 4.0 11/24/2014 0428   CL 111 11/24/2014 0428   CO2 26 11/24/2014 0428   GLUCOSE 92 11/24/2014 0428   BUN 15 11/24/2014 0428   CREATININE 0.52 11/24/2014 0428   CALCIUM 8.8* 11/24/2014 0428   PROT 5.6* 11/22/2014 0305   ALBUMIN 2.9* 11/22/2014 0305   AST 36 11/22/2014 0305   ALT 70* 11/22/2014 0305   ALKPHOS 71 11/22/2014 0305   BILITOT 0.9 11/22/2014 0305   GFRNONAA >60 11/24/2014 0428   GFRAA >60 11/24/2014 0428    Assessment and Plan  HLD (hyperlipidemia) Stable on current med; LDL 71; plan - cont lipitor 40 mg  Normocytic anemia Hb from SNF lab 7/28 was 9.4, Hb 8.4 at hospital 7/30; either way pt needs cont BID iron  Essential hypertension, benign Controlled on Lisinopril 5 mg  daily  URI - clear/white nasal d/c - phenylephrine 10 mg  q 8 prn for 7 days  Hennie Duos, MD

## 2015-03-11 NOTE — Assessment & Plan Note (Signed)
Hb from SNF lab 7/28 was 9.4, Hb 8.4 at hospital 7/30; either way pt needs cont BID iron

## 2015-04-04 ENCOUNTER — Non-Acute Institutional Stay (SKILLED_NURSING_FACILITY): Payer: Medicare HMO | Admitting: Adult Health

## 2015-04-04 DIAGNOSIS — E1149 Type 2 diabetes mellitus with other diabetic neurological complication: Secondary | ICD-10-CM | POA: Diagnosis not present

## 2015-04-04 DIAGNOSIS — D649 Anemia, unspecified: Secondary | ICD-10-CM | POA: Diagnosis not present

## 2015-04-04 DIAGNOSIS — G40901 Epilepsy, unspecified, not intractable, with status epilepticus: Secondary | ICD-10-CM | POA: Diagnosis not present

## 2015-04-04 DIAGNOSIS — E43 Unspecified severe protein-calorie malnutrition: Secondary | ICD-10-CM | POA: Diagnosis not present

## 2015-04-04 DIAGNOSIS — I10 Essential (primary) hypertension: Secondary | ICD-10-CM

## 2015-04-04 DIAGNOSIS — E785 Hyperlipidemia, unspecified: Secondary | ICD-10-CM

## 2015-04-04 DIAGNOSIS — Z8673 Personal history of transient ischemic attack (TIA), and cerebral infarction without residual deficits: Secondary | ICD-10-CM

## 2015-04-28 ENCOUNTER — Encounter: Payer: Self-pay | Admitting: Adult Health

## 2015-04-28 NOTE — Progress Notes (Signed)
Patient ID: [name] [name], female   DOB: Sep 01, 1955, 60 y.o.   MRN: [id number]    Facility:  [facility]      Allergies  Allergen Reactions  . Amoxicillin Itching  . Cephalexin Itching  . Penicillins Itching    Chief Complaint  Patient presents with  . Medical Management of Chronic Issues    HPI:  She is a resident of this facilty being seen for the management of her chronic illnesses. She tells me that she is feeling good and has no complaints or concerns. There are no nursing concerns a this time.   Past Medical History  Diagnosis Date  . Uterine fibroid   . Gastritis   . Hiatal hernia   . Hemorrhoids   . Anemia   . Stroke ([place]) 12 1989  . Dyslipidemia   . Hypertension   . Seizure disorder ([place])   . Carpal tunnel syndrome   . Depression   . Osteoporosis   . Urinary incontinence   . Iron deficiency anemia 2008    negative EGD and colonoscopy  . Diabetes mellitus   . Seizures ([place])   . Hyperlipidemia     Past Surgical History  Procedure Laterality Date  . Colonoscopy  2008  . Upper gastrointestinal endoscopy  2008  . Tracheostomy    . Breast surgery  1977  . Tubal ligation    . Cesarean section      x2  . Tracheostomy closure      VITAL SIGNS BP 140/88 mmHg  Pulse 83  Ht 5' 7" (1.702 m)  Wt 129 lb (58.514 kg)  BMI 20.20 kg/m2  SpO2 98%  LMP 06/24/2013  Patient's Medications  New Prescriptions   No medications on file  Previous Medications   ASPIRIN 325 MG TABLET    Take 325 mg by mouth daily.   ATORVASTATIN (LIPITOR) 40 MG TABLET    Take 40 mg by mouth daily at 12 noon.    IRON POLYSACCHARIDES (NIFEREX) 150 MG CAPSULE    Take 1 capsule (150 mg total) by mouth 2 (two) times daily.   LEVETIRACETAM (KEPPRA) 1000 MG TABLET    Take 1,000 mg by mouth 2 (two) times daily.   LISINOPRIL (PRINIVIL,ZESTRIL) 5 MG TABLET    Take 5 mg by mouth daily.   MULTIPLE VITAMIN (MULTIVITAMIN WITH MINERALS) TABS TABLET    Take 1 tablet by mouth daily.   NUTRITIONAL  SUPPLEMENT LIQD    Take 90 mLs by mouth 3 (three) times daily.   TRAMADOL (ULTRAM) 50 MG TABLET    Take one tablet by mouth every 8 hours as needed for pain  Modified Medications   No medications on file  Discontinued Medications   No medications on file     SIGNIFICANT DIAGNOSTIC EXAMS   11-20-14: chest x-ray: No active disease.  11-20-14: ct of head: No evidence of acute intracranial abnormality. Severe bifrontal encephalomalacia.  11-23-14: EEG: This EEG was abnormal with moderately severe continuous unspecific slowing of cerebral activity, consistent with sedation with propofol. However, this pattern of slowing can also be seen with metabolic and toxic encephalopathies as well as with postictal states. No evidence of seizure activity was recorded during this study.   LABS REVIEWED:   11-06-14: hgb a1c 5.8; tsh 2.390; free t4: 0.97 11-22-14: wbc 6.5 ;hgb 9.5; hct 27.8; mcv 93.3; plt: clumped; glucose 72; bun 21; creat 0.73; k+ 3.8; na++140; alt 70; ast 36; alk phos 71; albumin 2.9  12-28-14: chol 164; ldl 71; trig  46 hdl 64      Review of Systems  Constitutional: Negative for appetite change and fatigue.       Has a history of unintentional weight loss   HENT: Negative for congestion.   Respiratory: Negative for cough, chest tightness and shortness of breath.   Cardiovascular: Negative for chest pain, palpitations and leg swelling.  Gastrointestinal: Negative for nausea, abdominal pain, diarrhea and constipation.  Musculoskeletal: Negative for myalgias and arthralgias.       Has weakness present   Skin: Negative for pallor.  Neurological: Negative for dizziness.  Psychiatric/Behavioral: The patient is not nervous/anxious.       Physical Exam  Constitutional: She is oriented to person, place, and time. No distress.  Frail   Eyes: Conjunctivae are normal.  Neck: Neck supple. No JVD present. No thyromegaly present.  Cardiovascular: Normal rate, regular rhythm and intact  distal pulses.   Respiratory: Effort normal and breath sounds normal. No respiratory distress. She has no wheezes.  GI: Soft. Bowel sounds are normal. She exhibits no distension. There is no tenderness.  Musculoskeletal: She exhibits no edema.  Able to move all extremities   Lymphadenopathy:    She has no cervical adenopathy.  Neurological: She is alert and oriented to person, place, and time.  Skin: Skin is warm and dry. She is not diaphoretic.  Psychiatric: She has a normal mood and affect.       ASSESSMENT/ PLAN:  1. Hypertension: will continue lisinopril 5 mg daily   2. Dyslipidemia: will continue lipitor 40 mg daily ldl is 71  3. Anemia: will continue niferex  150 mg twice daily her hgb is 9.5  4. CVA: is neurologically stable will continue asa 325 mg daily   5. Seizure: no reports of seizure activity present; will continue keppra 1000 mg twice daily   6. Diabetes: is presently not on medications; is on asa; statin and asa hgb a1c is 5.8  7. Protein calorie malnutrition: will continue med pass 90 cc three times daily her albumin is 2.9  Her current weight is 129 pounds will monitor          Ok Edwards NP [facility]  Contact [phone] Monday through Friday 8am- 5pm  After hours call [phone]

## 2015-05-13 ENCOUNTER — Non-Acute Institutional Stay (SKILLED_NURSING_FACILITY): Payer: Medicare HMO | Admitting: Internal Medicine

## 2015-05-13 DIAGNOSIS — R569 Unspecified convulsions: Secondary | ICD-10-CM | POA: Diagnosis not present

## 2015-05-13 DIAGNOSIS — Z8673 Personal history of transient ischemic attack (TIA), and cerebral infarction without residual deficits: Secondary | ICD-10-CM

## 2015-05-13 DIAGNOSIS — E43 Unspecified severe protein-calorie malnutrition: Secondary | ICD-10-CM | POA: Diagnosis not present

## 2015-05-13 NOTE — Progress Notes (Signed)
MRN: [id number] Name: [name] [name]  Sex: female Age: 60 y.o. DOB: June 19, 1955  [place] #: Karren Burly Facility/Room: Level Of Care: SNF Provider: Inocencio Homes D Emergency Contacts: Extended Emergency Contact Information Primary Emergency Contact: Mcgrory,Lauren Address: [place], [place] Phone: [phone] Relation: Daughter  Code Status:   Allergies: Amoxicillin; Cephalexin; and Penicillins  Chief Complaint  Patient presents with  . Medical Management of Chronic Issues    HPI: Patient is 60 y.o. female with anemia, s/p stroke, anemia, HLD, HTN, seizure dx, depression, DM2 who is being seen for routine issues of s/p CVA,seizures and protein calorie malnutrition.   Past Medical History  Diagnosis Date  . Uterine fibroid   . Gastritis   . Hiatal hernia   . Hemorrhoids   . Anemia   . Stroke ([place]) 12 1989  . Dyslipidemia   . Hypertension   . Seizure disorder ([place])   . Carpal tunnel syndrome   . Depression   . Osteoporosis   . Urinary incontinence   . Iron deficiency anemia 2008    negative EGD and colonoscopy  . Diabetes mellitus   . Seizures ([place])   . Hyperlipidemia     Past Surgical History  Procedure Laterality Date  . Colonoscopy  2008  . Upper gastrointestinal endoscopy  2008  . Tracheostomy    . Breast surgery  1977  . Tubal ligation    . Cesarean section      x2  . Tracheostomy closure        Medication List       This list is accurate as of: 05/13/15 11:59 PM.  Always use your most recent med list.               aspirin 325 MG tablet  Take 325 mg by mouth daily.     atorvastatin 40 MG tablet  Commonly known as:  LIPITOR  Take 40 mg by mouth daily at 12 noon.     iron polysaccharides 150 MG capsule  Commonly known as:  NIFEREX  Take 1 capsule (150 mg total) by mouth 2 (two) times daily.     levETIRAcetam 1000 MG tablet  Commonly known as:  KEPPRA  Take 1,000 mg by mouth 2 (two) times  daily.     lisinopril 5 MG tablet  Commonly known as:  PRINIVIL,ZESTRIL  Take 5 mg by mouth daily.     multivitamin with minerals Tabs tablet  Take 1 tablet by mouth daily.     NUTRITIONAL SUPPLEMENT Liqd  Take 90 mLs by mouth 3 (three) times daily.     traMADol 50 MG tablet  Commonly known as:  ULTRAM  Take one tablet by mouth every 8 hours as needed for pain        No orders of the defined types were placed in this encounter.    Immunization History  Administered Date(s) Administered  . Influenza,inj,Quad PF,36+ Mos 05/03/2014  . Influenza-Unspecified 12/26/2013    Social History  Substance Use Topics  . Smoking status: Never Smoker   . Smokeless tobacco: Never Used  . Alcohol Use: No    Review of Systems-today pt had her face under the covers and did not speak to me; nursing without concerns    Filed Vitals:   05/18/15 1524  BP: 128/78  Pulse: 80  Temp: 98 F (36.7 C)  Resp: 18    Physical Exam  GENERAL APPEARANCE:  Alert, non conversant, No acute distress  SKIN: No diaphoresis rash, or wounds HEENT: Unremarkable RESPIRATORY: Breathing is even, unlabored. Lung sounds are clear   CARDIOVASCULAR: Heart RRR no murmurs, rubs or gallops. No peripheral edema  GASTROINTESTINAL: Abdomen is soft, non-tender, not distended w/ normal bowel sounds.  GENITOURINARY: Bladder non tender, not distended  MUSCULOSKELETAL: No abnormal joints or musculature NEUROLOGIC: Cranial nerves 2-12 grossly intact; R side paresis ut can use RUE PSYCHIATRIC: Mood and affect-pt quiet today, no behavioral issues  Patient Active Problem List   Diagnosis Date Noted  . Seizures ([place]) 05/18/2015  . Type II diabetes mellitus with neurological manifestations ([place]) 02/07/2015  . Essential hypertension, benign 02/07/2015  . Status epilepticus ([place])   . Encounter for orogastric (OG) tube placement   . Acute respiratory failure ([place])   . Chest pain   . Septic shock ([place]) 05/02/2014  .  Normocytic anemia 05/01/2014  . Protein-calorie malnutrition, severe ([place]) 02/23/2014  . HLD (hyperlipidemia) 02/22/2014  . History of stroke 06/09/2007    CBC    Component Value Date/Time   WBC 4.5 11/24/2014 0428   RBC 2.66* 11/24/2014 0428   RBC 3.41* 07/25/2013 0830   HGB 8.4* 11/24/2014 0428   HCT 25.5* 11/24/2014 0428   PLT  11/24/2014 0428    PLATELET CLUMPING, SUGGEST RECOLLECTION OF SAMPLE IN CITRATE TUBE.   MCV 95.9 11/24/2014 0428   LYMPHSABS 1.3 11/22/2014 0305   MONOABS 0.4 11/22/2014 0305   EOSABS 0.1 11/22/2014 0305   BASOSABS 0.0 11/22/2014 0305    CMP     Component Value Date/Time   NA 141 11/24/2014 0428   K 4.0 11/24/2014 0428   CL 111 11/24/2014 0428   CO2 26 11/24/2014 0428   GLUCOSE 92 11/24/2014 0428   BUN 15 11/24/2014 0428   CREATININE 0.52 11/24/2014 0428   CALCIUM 8.8* 11/24/2014 0428   PROT 5.6* 11/22/2014 0305   ALBUMIN 2.9* 11/22/2014 0305   AST 36 11/22/2014 0305   ALT 70* 11/22/2014 0305   ALKPHOS 71 11/22/2014 0305   BILITOT 0.9 11/22/2014 0305   GFRNONAA >60 11/24/2014 0428   GFRAA >60 11/24/2014 0428    Assessment and Plan  History of stroke Chronic and neurologically stable will continue asa 325 mg daily   Seizures (HCC) Stable; no reports of seizure activity present; will continue keppra 1000 mg twice daily   Protein-calorie malnutrition, severe Ongoing;  continue med pass 90 cc three times daily her albumin is 2.9  Her current weight is 129 pounds will monitor      Hennie Duos, MD

## 2015-05-18 ENCOUNTER — Encounter: Payer: Self-pay | Admitting: Internal Medicine

## 2015-05-18 DIAGNOSIS — R569 Unspecified convulsions: Secondary | ICD-10-CM | POA: Insufficient documentation

## 2015-05-18 NOTE — Assessment & Plan Note (Signed)
Chronic and neurologically stable will continue asa 325 mg daily

## 2015-05-18 NOTE — Assessment & Plan Note (Signed)
Ongoing;  continue med pass 90 cc three times daily her albumin is 2.9  Her current weight is 129 pounds will monitor

## 2015-05-18 NOTE — Assessment & Plan Note (Signed)
Stable; no reports of seizure activity present; will continue keppra 1000 mg twice daily

## 2015-06-10 ENCOUNTER — Non-Acute Institutional Stay: Payer: Medicare HMO | Admitting: Internal Medicine

## 2015-06-10 ENCOUNTER — Encounter: Payer: Self-pay | Admitting: Internal Medicine

## 2015-06-10 DIAGNOSIS — J302 Other seasonal allergic rhinitis: Secondary | ICD-10-CM

## 2015-06-10 DIAGNOSIS — E1149 Type 2 diabetes mellitus with other diabetic neurological complication: Secondary | ICD-10-CM

## 2015-06-10 DIAGNOSIS — I10 Essential (primary) hypertension: Secondary | ICD-10-CM | POA: Diagnosis not present

## 2015-06-10 NOTE — Progress Notes (Signed)
MRN: [id number] Name: [name] [name]  Sex: female Age: 60 y.o. DOB: 07/13/1955  [place] #: Karren Burly Facility/Room:216 Level Of Care: SNF Provider: Inocencio Homes D Emergency Contacts: Extended Emergency Contact Information Primary Emergency Contact: Silverstein,Lauren Address: [place], [place] of [place] Phone: [phone] Relation: Daughter  Code Status:   Allergies: Amoxicillin; Cephalexin; and Penicillins  Chief Complaint  Patient presents with  . Medical Management of Chronic Issues    HPI: Patient is 60 y.o. female who is being seen for routine issues of HTN and DM. Also her seasonal allergies are bothering her and sgshe would like something for them.  Past Medical History  Diagnosis Date  . Uterine fibroid   . Gastritis   . Hiatal hernia   . Hemorrhoids   . Anemia   . Stroke ([place]) 12 1989  . Dyslipidemia   . Hypertension   . Seizure disorder ([place])   . Carpal tunnel syndrome   . Depression   . Osteoporosis   . Urinary incontinence   . Iron deficiency anemia 2008    negative EGD and colonoscopy  . Diabetes mellitus   . Seizures ([place])   . Hyperlipidemia     Past Surgical History  Procedure Laterality Date  . Colonoscopy  2008  . Upper gastrointestinal endoscopy  2008  . Tracheostomy    . Breast surgery  1977  . Tubal ligation    . Cesarean section      x2  . Tracheostomy closure        Medication List       This list is accurate as of: 06/10/15 11:59 PM.  Always use your most recent med list.               aspirin 325 MG tablet  Take 325 mg by mouth daily.     atorvastatin 40 MG tablet  Commonly known as:  LIPITOR  Take 40 mg by mouth daily at 12 noon.     iron polysaccharides 150 MG capsule  Commonly known as:  NIFEREX  Take 1 capsule (150 mg total) by mouth 2 (two) times daily.     levETIRAcetam 1000 MG tablet  Commonly known as:  KEPPRA  Take 1,000 mg by mouth 2 (two) times daily.     lisinopril  5 MG tablet  Commonly known as:  PRINIVIL,ZESTRIL  Take 5 mg by mouth daily.     multivitamin with minerals Tabs tablet  Take 1 tablet by mouth daily.     NUTRITIONAL SUPPLEMENT Liqd  Take 90 mLs by mouth 3 (three) times daily.     traMADol 50 MG tablet  Commonly known as:  ULTRAM  Take one tablet by mouth every 8 hours as needed for pain        No orders of the defined types were placed in this encounter.    Immunization History  Administered Date(s) Administered  . Influenza,inj,Quad PF,36+ Mos 05/03/2014  . Influenza-Unspecified 12/26/2013    Social History  Substance Use Topics  . Smoking status: Never Smoker   . Smokeless tobacco: Never Used  . Alcohol Use: No    Review of Systems  DATA OBTAINED: from patient GENERAL:  no fevers, fatigue, appetite changes SKIN: No itching, rash HEENT: No complaint- allegies -runny nose RESPIRATORY: No cough, wheezing, SOB CARDIAC: No chest pain, palpitations, lower extremity edema  GI: No abdominal pain, No N/V/D or constipation, No heartburn or reflux  GU: No dysuria, frequency or urgency, or incontinence  MUSCULOSKELETAL: No unrelieved bone/joint pain NEUROLOGIC: No headache, dizziness  PSYCHIATRIC: No overt anxiety or sadness  Filed Vitals:   06/10/15 1616  BP: 130/68  Pulse: 72  Temp: 98.3 F (36.8 C)  Resp: 20    Physical Exam  GENERAL APPEARANCE: Alert, conversant, No acute distress BF working a crossword puzzle SKIN: No diaphoresis rash HEENT: Unremarkable RESPIRATORY: Breathing is even, unlabored. Lung sounds are clear   CARDIOVASCULAR: Heart RRR no murmurs, rubs or gallops. No peripheral edema  GASTROINTESTINAL: Abdomen is soft, non-tender, not distended w/ normal bowel sounds.  GENITOURINARY: Bladder non tender, not distended  MUSCULOSKELETAL: No abnormal joints or musculature NEUROLOGIC: Cranial nerves 2-12 grossly intact. Moves all extremities PSYCHIATRIC: Mood and affect appropriate to situation, no  behavioral issues  Patient Active Problem List   Diagnosis Date Noted  . Seasonal allergies 06/16/2015  . Seizures ([place]) 05/18/2015  . Type II diabetes mellitus with neurological manifestations ([place]) 02/07/2015  . Essential hypertension, benign 02/07/2015  . Status epilepticus ([place])   . Encounter for orogastric (OG) tube placement   . Acute respiratory failure ([place])   . Chest pain   . Septic shock ([place]) 05/02/2014  . Normocytic anemia 05/01/2014  . Protein-calorie malnutrition, severe ([place]) 02/23/2014  . HLD (hyperlipidemia) 02/22/2014  . History of stroke 06/09/2007    CBC    Component Value Date/Time   WBC 4.5 11/24/2014 0428   RBC 2.66* 11/24/2014 0428   RBC 3.41* 07/25/2013 0830   HGB 8.4* 11/24/2014 0428   HCT 25.5* 11/24/2014 0428   PLT  11/24/2014 0428    PLATELET CLUMPING, SUGGEST RECOLLECTION OF SAMPLE IN CITRATE TUBE.   MCV 95.9 11/24/2014 0428   LYMPHSABS 1.3 11/22/2014 0305   MONOABS 0.4 11/22/2014 0305   EOSABS 0.1 11/22/2014 0305   BASOSABS 0.0 11/22/2014 0305    CMP     Component Value Date/Time   NA 141 11/24/2014 0428   K 4.0 11/24/2014 0428   CL 111 11/24/2014 0428   CO2 26 11/24/2014 0428   GLUCOSE 92 11/24/2014 0428   BUN 15 11/24/2014 0428   CREATININE 0.52 11/24/2014 0428   CALCIUM 8.8* 11/24/2014 0428   PROT 5.6* 11/22/2014 0305   ALBUMIN 2.9* 11/22/2014 0305   AST 36 11/22/2014 0305   ALT 70* 11/22/2014 0305   ALKPHOS 71 11/22/2014 0305   BILITOT 0.9 11/22/2014 0305   GFRNONAA >60 11/24/2014 0428   GFRAA >60 11/24/2014 0428    Assessment and Plan  Essential hypertension, benign Controlled in lisinopril 5 mg daily; cont med  Type II diabetes mellitus with neurological manifestations ([place]) is presently not on medications; is on asa; statin and asa hgb a1c is 5.8   Seasonal allergies Will start claritin 10 mg po daily and benadryl at night as requested    Hennie Duos, MD

## 2015-06-16 ENCOUNTER — Encounter: Payer: Self-pay | Admitting: Internal Medicine

## 2015-06-16 DIAGNOSIS — J302 Other seasonal allergic rhinitis: Secondary | ICD-10-CM | POA: Insufficient documentation

## 2015-06-16 NOTE — Assessment & Plan Note (Signed)
Will start claritin 10 mg po daily and benadryl at night as requested

## 2015-06-16 NOTE — Assessment & Plan Note (Signed)
Controlled in lisinopril 5 mg daily; cont med

## 2015-06-16 NOTE — Assessment & Plan Note (Signed)
is presently not on medications; is on asa; statin and asa hgb a1c is 5.8

## 2015-06-20 ENCOUNTER — Non-Acute Institutional Stay: Payer: Medicare HMO | Admitting: Internal Medicine

## 2015-06-20 DIAGNOSIS — M7989 Other specified soft tissue disorders: Secondary | ICD-10-CM | POA: Diagnosis not present

## 2015-06-20 DIAGNOSIS — R29898 Other symptoms and signs involving the musculoskeletal system: Secondary | ICD-10-CM

## 2015-06-20 NOTE — Progress Notes (Signed)
MRN: [id number] Name: [name] [name]  Sex: female Age: 60 y.o. DOB: [birth year]  [place] #: Karren Burly Facility/Room: Level Of Care: SNF Provider: Inocencio Homes D Emergency Contacts: Extended Emergency Contact Information Primary Emergency Contact: Tinch,Lauren Address: [place], [place] of [place] Phone: [phone] Relation: Daughter  Code Status:   Allergies: Amoxicillin; Cephalexin; and Penicillins  Chief Complaint  Patient presents with  . Acute Visit    HPI: Patient is 60 y.o. female who stopped me in the hall to see her. She c/o feeling tight in R arm for several weeks and weaker in her arm when she tries to raise it. She denies numbness or tingling, is not dropping anything from her hand. She c/o R shoulder has pain when she tries to raise it. She denies falls or other trauma.  Past Medical History  Diagnosis Date  . Uterine fibroid   . Gastritis   . Hiatal hernia   . Hemorrhoids   . Anemia   . Stroke ([place]) 12 1989  . Dyslipidemia   . Hypertension   . Seizure disorder ([place])   . Carpal tunnel syndrome   . Depression   . Osteoporosis   . Urinary incontinence   . Iron deficiency anemia 2008    negative EGD and colonoscopy  . Diabetes mellitus   . Seizures ([place])   . Hyperlipidemia     Past Surgical History  Procedure Laterality Date  . Colonoscopy  2008  . Upper gastrointestinal endoscopy  2008  . Tracheostomy    . Breast surgery  1977  . Tubal ligation    . Cesarean section      x2  . Tracheostomy closure        Medication List       This list is accurate as of: 06/20/15 11:59 PM.  Always use your most recent med list.               aspirin 325 MG tablet  Take 325 mg by mouth daily.     atorvastatin 40 MG tablet  Commonly known as:  LIPITOR  Take 40 mg by mouth daily at 12 noon.     iron polysaccharides 150 MG capsule  Commonly known as:  NIFEREX  Take 1 capsule (150 mg total) by mouth 2 (two) times  daily.     levETIRAcetam 1000 MG tablet  Commonly known as:  KEPPRA  Take 1,000 mg by mouth 2 (two) times daily.     lisinopril 5 MG tablet  Commonly known as:  PRINIVIL,ZESTRIL  Take 5 mg by mouth daily.     multivitamin with minerals Tabs tablet  Take 1 tablet by mouth daily.     NUTRITIONAL SUPPLEMENT Liqd  Take 90 mLs by mouth 3 (three) times daily.     traMADol 50 MG tablet  Commonly known as:  ULTRAM  Take one tablet by mouth every 8 hours as needed for pain        No orders of the defined types were placed in this encounter.    Immunization History  Administered Date(s) Administered  . Influenza,inj,Quad PF,36+ Mos 05/03/2014  . Influenza-Unspecified 12/26/2013    Social History  Substance Use Topics  . Smoking status: Never Smoker   . Smokeless tobacco: Never Used  . Alcohol Use: No    Review of Systems  DATA OBTAINED: from patient GENERAL:  no fevers, fatigue, appetite changes SKIN: No itching, rash HEENT:  No complaint RESPIRATORY: No cough, wheezing, SOB CARDIAC: No chest pain, palpitations, lower extremity edema  GI: No abdominal pain, No N/V/D or constipation, No heartburn or reflux  GU: No dysuria, frequency or urgency, or incontinence  MUSCULOSKELETAL: as per HPI NEUROLOGIC: No headache, dizziness  PSYCHIATRIC: No overt anxiety or sadness  Filed Vitals:   06/25/15 1713  BP: 130/68  Pulse: 80  Temp: 98.3 F (36.8 C)  Resp: 20    Physical Exam  GENERAL APPEARANCE: Alert, conversant, No acute distress  SKIN: No diaphoresis rash, or wounds HEENT: Unremarkable RESPIRATORY: Breathing is even, unlabored. Lung sounds are clear   CARDIOVASCULAR: Heart RRR no murmurs, rubs or gallops; RUE with mild edema throughout entire arm c/w left arm GASTROINTESTINAL: Abdomen is soft, non-tender, not distended w/ normal bowel sounds.  GENITOURINARY: Bladder non tender, not distended  MUSCULOSKELETAL: No abnormal joints or musculature NEUROLOGIC: Cranial  nerves 2-12 grossly intact. Moves all extremities; pt cannot abduct her R shoulder past horizontal; she can hold it above he head for some seconds if passively elevated PSYCHIATRIC: Mood and affect appropriate to situation, no behavioral issues  Patient Active Problem List   Diagnosis Date Noted  . Seasonal allergies 06/16/2015  . Seizures ([place]) 05/18/2015  . Type II diabetes mellitus with neurological manifestations ([place]) 02/07/2015  . Essential hypertension, benign 02/07/2015  . Status epilepticus ([place])   . Encounter for orogastric (OG) tube placement   . Acute respiratory failure ([place])   . Chest pain   . Septic shock ([place]) 05/02/2014  . Normocytic anemia 05/01/2014  . Protein-calorie malnutrition, severe ([place]) 02/23/2014  . HLD (hyperlipidemia) 02/22/2014  . History of stroke 06/09/2007    CBC    Component Value Date/Time   WBC 4.5 11/24/2014 0428   RBC 2.66* 11/24/2014 0428   RBC 3.41* 07/25/2013 0830   HGB 8.4* 11/24/2014 0428   HCT 25.5* 11/24/2014 0428   PLT  11/24/2014 0428    PLATELET CLUMPING, SUGGEST RECOLLECTION OF SAMPLE IN CITRATE TUBE.   MCV 95.9 11/24/2014 0428   LYMPHSABS 1.3 11/22/2014 0305   MONOABS 0.4 11/22/2014 0305   EOSABS 0.1 11/22/2014 0305   BASOSABS 0.0 11/22/2014 0305    CMP     Component Value Date/Time   NA 141 11/24/2014 0428   K 4.0 11/24/2014 0428   CL 111 11/24/2014 0428   CO2 26 11/24/2014 0428   GLUCOSE 92 11/24/2014 0428   BUN 15 11/24/2014 0428   CREATININE 0.52 11/24/2014 0428   CALCIUM 8.8* 11/24/2014 0428   PROT 5.6* 11/22/2014 0305   ALBUMIN 2.9* 11/22/2014 0305   AST 36 11/22/2014 0305   ALT 70* 11/22/2014 0305   ALKPHOS 71 11/22/2014 0305   BILITOT 0.9 11/22/2014 0305   GFRNONAA >60 11/24/2014 0428   GFRAA >60 11/24/2014 0428    Assessment and Plan   R ARM SWELLING -  Without redness or heat or coolness ordered RUE U/S  R SHOULDER WEAKNESS - IF U/S is neg have ordered PT to eval her; depending on their  findings may have her see neuro as outpt  Hennie Duos, MD

## 2015-06-25 ENCOUNTER — Encounter: Payer: Self-pay | Admitting: Internal Medicine

## 2015-07-01 ENCOUNTER — Encounter: Payer: Self-pay | Admitting: Internal Medicine

## 2015-07-01 ENCOUNTER — Non-Acute Institutional Stay (SKILLED_NURSING_FACILITY): Payer: Medicare HMO | Admitting: Internal Medicine

## 2015-07-01 ENCOUNTER — Encounter: Payer: Self-pay | Admitting: *Deleted

## 2015-07-01 DIAGNOSIS — E785 Hyperlipidemia, unspecified: Secondary | ICD-10-CM

## 2015-07-01 DIAGNOSIS — J302 Other seasonal allergic rhinitis: Secondary | ICD-10-CM

## 2015-07-01 DIAGNOSIS — I10 Essential (primary) hypertension: Secondary | ICD-10-CM | POA: Diagnosis not present

## 2015-07-01 DIAGNOSIS — D649 Anemia, unspecified: Secondary | ICD-10-CM | POA: Diagnosis not present

## 2015-07-01 DIAGNOSIS — E43 Unspecified severe protein-calorie malnutrition: Secondary | ICD-10-CM | POA: Diagnosis not present

## 2015-07-01 DIAGNOSIS — Z8673 Personal history of transient ischemic attack (TIA), and cerebral infarction without residual deficits: Secondary | ICD-10-CM

## 2015-07-01 DIAGNOSIS — E1149 Type 2 diabetes mellitus with other diabetic neurological complication: Secondary | ICD-10-CM | POA: Diagnosis not present

## 2015-07-01 DIAGNOSIS — R569 Unspecified convulsions: Secondary | ICD-10-CM

## 2015-07-01 NOTE — Progress Notes (Signed)
MRN: [id number] Name: [name] [name]  Sex: female Age: 60 y.o. DOB: Jan 14, 1956  [place] #: Karren Burly Facility/Room:216 Level Of Care: SNF Provider: Inocencio Homes D Emergency Contacts: Extended Emergency Contact Information Primary Emergency Contact: Courville,Lauren Address: [place], [place] of [place] Phone: [phone] Relation: Daughter  Code Status:   Allergies: Amoxicillin; Cephalexin; and Penicillins  Chief Complaint  Patient presents with  . Discharge Note    HPI: Patient is 60 y.o. female with anemia, s/p stroke, anemia, HLD, HTN, seizure dx, depression, DM2 who is being transferred to another SNF per her request.  Past Medical History  Diagnosis Date  . Uterine fibroid   . Gastritis   . Hiatal hernia   . Hemorrhoids   . Anemia   . Stroke ([place]) 12 1989  . Dyslipidemia   . Hypertension   . Seizure disorder ([place])   . Carpal tunnel syndrome   . Depression   . Osteoporosis   . Urinary incontinence   . Iron deficiency anemia 2008    negative EGD and colonoscopy  . Diabetes mellitus   . Seizures ([place])   . Hyperlipidemia     Past Surgical History  Procedure Laterality Date  . Colonoscopy  2008  . Upper gastrointestinal endoscopy  2008  . Tracheostomy    . Breast surgery  1977  . Tubal ligation    . Cesarean section      x2  . Tracheostomy closure        Medication List       This list is accurate as of: 07/01/15  7:08 PM.  Always use your most recent med list.               aspirin 325 MG tablet  Take 325 mg by mouth daily. For hypertension.     atorvastatin 40 MG tablet  Commonly known as:  LIPITOR  Take 40 mg by mouth daily at 12 noon. For hyperlipidemia.     diphenhydrAMINE 25 MG tablet  Commonly known as:  SOMINEX  Take 25 mg by mouth at bedtime as needed for sleep. For allergies.     levETIRAcetam 1000 MG tablet  Commonly known as:  KEPPRA  Take 1,000 mg by mouth 2 (two) times daily. For  conversion disorder with seizures or convulsions.     lisinopril 5 MG tablet  Commonly known as:  PRINIVIL,ZESTRIL  Take 5 mg by mouth daily. For hypertension.     loratadine 10 MG tablet  Commonly known as:  CLARITIN  Take 10 mg by mouth every morning. For allergies.     multivitamin with minerals Tabs tablet  Take 1 tablet by mouth daily.     NUTRITIONAL SUPPLEMENT Liqd  Take 90 mLs by mouth 3 (three) times daily.     traMADol 50 MG tablet  Commonly known as:  ULTRAM  Take one tablet by mouth every 8 hours as needed for pain        No orders of the defined types were placed in this encounter.    Immunization History  Administered Date(s) Administered  . Influenza,inj,Quad PF,36+ Mos 05/03/2014  . Influenza-Unspecified 12/26/2013  . PPD Test 02/06/2015    Social History  Substance Use Topics  . Smoking status: Never Smoker   . Smokeless tobacco: Never Used  . Alcohol Use: No    Filed Vitals:   07/01/15 1901  BP: 139/65  Pulse: 72  Temp: 97.8 F (  36.6 C)  Resp: 18    Physical Exam  GENERAL APPEARANCE: Alert, conversant. No acute distress.  HEENT: Unremarkable. RESPIRATORY: Breathing is even, unlabored. Lung sounds are clear   CARDIOVASCULAR: Heart RRR no murmurs, rubs or gallops. No peripheral edema.  GASTROINTESTINAL: Abdomen is soft, non-tender, not distended w/ normal bowel sounds.  NEUROLOGIC: Cranial nerves 2-12 grossly intact; R side weakness  Patient Active Problem List   Diagnosis Date Noted  . Seasonal allergies 06/16/2015  . Seizures ([place]) 05/18/2015  . Type II diabetes mellitus with neurological manifestations ([place]) 02/07/2015  . Essential hypertension, benign 02/07/2015  . Status epilepticus ([place])   . Encounter for orogastric (OG) tube placement   . Acute respiratory failure ([place])   . Chest pain   . Septic shock ([place]) 05/02/2014  . Normocytic anemia 05/01/2014  . Protein-calorie malnutrition, severe ([place]) 02/23/2014  . HLD  (hyperlipidemia) 02/22/2014  . History of stroke 06/09/2007    CBC    Component Value Date/Time   WBC 4.5 11/24/2014 0428   RBC 2.66* 11/24/2014 0428   RBC 3.41* 07/25/2013 0830   HGB 8.4* 11/24/2014 0428   HCT 25.5* 11/24/2014 0428   PLT  11/24/2014 0428    PLATELET CLUMPING, SUGGEST RECOLLECTION OF SAMPLE IN CITRATE TUBE.   MCV 95.9 11/24/2014 0428   LYMPHSABS 1.3 11/22/2014 0305   MONOABS 0.4 11/22/2014 0305   EOSABS 0.1 11/22/2014 0305   BASOSABS 0.0 11/22/2014 0305    CMP     Component Value Date/Time   NA 141 11/24/2014 0428   K 4.0 11/24/2014 0428   CL 111 11/24/2014 0428   CO2 26 11/24/2014 0428   GLUCOSE 92 11/24/2014 0428   BUN 15 11/24/2014 0428   CREATININE 0.52 11/24/2014 0428   CALCIUM 8.8* 11/24/2014 0428   PROT 5.6* 11/22/2014 0305   ALBUMIN 2.9* 11/22/2014 0305   AST 36 11/22/2014 0305   ALT 70* 11/22/2014 0305   ALKPHOS 71 11/22/2014 0305   BILITOT 0.9 11/22/2014 0305   GFRNONAA >60 11/24/2014 0428   GFRAA >60 11/24/2014 0428    Assessment and Plan  Pt is stable for d/c to a new SNF. Medications have been reconciled and Rx has been written.  Time spent > 30 min;> 50% of time with patient was spent reviewing records, labs, tests and studies, counseling and developing plan of care   Hennie Duos, MD

## 2015-07-04 ENCOUNTER — Non-Acute Institutional Stay (SKILLED_NURSING_FACILITY): Payer: Medicare HMO | Admitting: Internal Medicine

## 2015-07-04 ENCOUNTER — Encounter: Payer: Self-pay | Admitting: Internal Medicine

## 2015-07-04 DIAGNOSIS — M25461 Effusion, right knee: Secondary | ICD-10-CM

## 2015-07-04 DIAGNOSIS — G8389 Other specified paralytic syndromes: Secondary | ICD-10-CM

## 2015-07-04 DIAGNOSIS — I10 Essential (primary) hypertension: Secondary | ICD-10-CM

## 2015-07-04 DIAGNOSIS — M25561 Pain in right knee: Secondary | ICD-10-CM

## 2015-07-04 DIAGNOSIS — D509 Iron deficiency anemia, unspecified: Secondary | ICD-10-CM

## 2015-07-04 DIAGNOSIS — E43 Unspecified severe protein-calorie malnutrition: Secondary | ICD-10-CM

## 2015-07-04 DIAGNOSIS — M62838 Other muscle spasm: Secondary | ICD-10-CM | POA: Diagnosis not present

## 2015-07-04 DIAGNOSIS — R569 Unspecified convulsions: Secondary | ICD-10-CM

## 2015-07-04 DIAGNOSIS — R5381 Other malaise: Secondary | ICD-10-CM | POA: Diagnosis not present

## 2015-07-04 DIAGNOSIS — E785 Hyperlipidemia, unspecified: Secondary | ICD-10-CM | POA: Diagnosis not present

## 2015-07-04 DIAGNOSIS — J302 Other seasonal allergic rhinitis: Secondary | ICD-10-CM | POA: Diagnosis not present

## 2015-07-04 DIAGNOSIS — IMO0002 Reserved for concepts with insufficient information to code with codable children: Secondary | ICD-10-CM

## 2015-07-04 DIAGNOSIS — E1149 Type 2 diabetes mellitus with other diabetic neurological complication: Secondary | ICD-10-CM | POA: Diagnosis not present

## 2015-07-04 NOTE — Progress Notes (Signed)
LOCATION: [facility]  PCP: Helane Rima, MD   Code Status: Full Code  Goals of care: Advanced Directive information Advanced Directives 01/08/2015  Does patient have an advance directive? No  Would patient like information on creating an advanced directive? No - patient declined information       Extended Emergency Contact Information Primary Emergency Contact: Cegielski,Lauren Address: [place], [place] of [place] Phone: [phone] Relation: Daughter   Allergies  Allergen Reactions  . Amoxicillin Itching  . Cephalexin Itching  . Penicillins Itching    Chief Complaint  Patient presents with  . New Admit To SNF    New Admission     HPI:  Patient is a 60 y.o. female seen today for long term care from another facility. She has PMH of HTN, CVA, type 2 DM, HLD, seizure among others. She is seen in her room today. She complaints of tightness to both her legs and has right knee and leg pain after hitting her leg against something few days back. She denies any other concern in particular.   Review of Systems:  Constitutional: Negative for fever, chills, malaise and diaphoresis. Energy level is good.  HENT: Negative for headache, congestion, nasal discharge, hearing loss, sore throat, difficulty swallowing. Eyes: Negative for blurred vision, double vision and discharge. Wears glasses.  Respiratory: Negative for cough, shortness of breath and wheezing.   Cardiovascular: Negative for chest pain, palpitations, leg swelling.  Gastrointestinal: Negative for heartburn, nausea, vomiting, abdominal pain, loss of appetite, melena, diarrhea and constipation. Last bowel movement was yesterday.  Genitourinary: Negative for dysuria and flank pain.  Musculoskeletal: Negative for back pain, fall in this facility.  Skin: Negative for itching, rash.  Neurological: Negative for weakness and dizziness. Psychiatric/Behavioral: Negative for  depression, anxiety, insomnia and memory loss.    Past Medical History  Diagnosis Date  . Uterine fibroid   . Gastritis   . Hiatal hernia   . Hemorrhoids   . Anemia   . Stroke ([place]) 12 1989  . Dyslipidemia   . Hypertension   . Seizure disorder ([place])   . Carpal tunnel syndrome   . Depression   . Osteoporosis   . Urinary incontinence   . Iron deficiency anemia 2008    negative EGD and colonoscopy  . Diabetes mellitus   . Seizures ([place])   . Hyperlipidemia    Past Surgical History  Procedure Laterality Date  . Colonoscopy  2008  . Upper gastrointestinal endoscopy  2008  . Tracheostomy    . Breast surgery  1977  . Tubal ligation    . Cesarean section      x2  . Tracheostomy closure     Social History:   reports that she has never smoked. She has never used smokeless tobacco. She reports that she does not drink alcohol or use illicit drugs.  Family History  Problem Relation Age of Onset  . Heart disease Paternal Grandfather   . Hypertension Paternal Grandfather   . Prostate cancer Paternal Uncle   . Ovarian cancer Mother   . Diabetes Cousin   . Colon cancer Neg Hx   . Hypertension Maternal Grandmother   . Stroke Maternal Grandmother   . Diabetes Brother   . Heart attack Brother     Medications:   Medication List       This list is accurate as of: 07/04/15 11:33 AM.  Always use your  most recent med list.               aspirin 325 MG tablet  Take 325 mg by mouth daily. For hypertension.     atorvastatin 40 MG tablet  Commonly known as:  LIPITOR  Take 40 mg by mouth daily at 12 noon. For hyperlipidemia.     diphenhydrAMINE 25 MG tablet  Commonly known as:  SOMINEX  Take 25 mg by mouth at bedtime as needed for sleep. For allergies.     ferrous sulfate 325 (65 FE) MG tablet  Take 325 mg by mouth 2 (two) times daily.     levETIRAcetam 1000 MG tablet  Commonly known as:  KEPPRA  Take 1,000 mg by mouth 2 (two) times daily. For conversion disorder with  seizures or convulsions.     lisinopril 5 MG tablet  Commonly known as:  PRINIVIL,ZESTRIL  Take 5 mg by mouth daily. For hypertension.     loratadine 10 MG tablet  Commonly known as:  CLARITIN  Take 10 mg by mouth every morning. For allergies.     multivitamin with minerals Tabs tablet  Take 1 tablet by mouth daily.     traMADol 50 MG tablet  Commonly known as:  ULTRAM  Take one tablet by mouth every 8 hours as needed for pain     UNABLE TO FIND  Med Name: Med pass 30 mL 3 times daily for supplement     zinc oxide 11.3 % Crea cream  Commonly known as:  BALMEX  Apply 1 application topically daily as needed. Cleanse buttocks and apply afterwards        Immunizations: Immunization History  Administered Date(s) Administered  . Influenza,inj,Quad PF,36+ Mos 05/03/2014  . Influenza-Unspecified 12/26/2013  . PPD Test 02/06/2015     Physical Exam: Filed Vitals:   07/04/15 1119  BP: 156/90  Pulse: 100  Temp: 97.7 F (36.5 C)  TempSrc: Oral  Resp: 20  Height: 5\' 7"  (1.702 m)  Weight: 129 lb (58.514 kg)  SpO2: 98%   Body mass index is 20.2 kg/(m^2).  General- adult female, well built, in no acute distress Head- normocephalic, atraumatic Nose- no maxillary or frontal sinus tenderness, no nasal discharge Throat- moist mucus membrane Eyes- PERRLA, EOMI, no pallor, no icterus, no discharge, normal conjunctiva, normal sclera Neck- no cervical lymphadenopathy Cardiovascular- normal s1,s2, no murmur, no leg edema Respiratory- bilateral clear to auscultation, no wheeze, no rhonchi, no crackles, no use of accessory muscles Abdomen- bowel sounds present, soft, non tender Musculoskeletal- able to move her upper extremities with ROM limited in right shoulder, limited movement to LE with weakness and increased tone noted, swelling of right knee but normal temperature, no erythema, mild tenderness to medial area, no drainage, no erythema, has weak grip in both hands, needs  assistance with transfers Neurological- alert and oriented to person, place and time Skin- warm and dry, scab to right shin noted  Psychiatry- normal mood and affect    Labs reviewed: Basic Metabolic Panel:  Recent Labs  11/20/14 2000 11/21/14 0016 11/22/14 0305 11/23/14 0232 11/24/14 0428  NA  --  142 140 141 141  K  --  3.8 3.8 3.5 4.0  CL  --  110 108 110 111  CO2  --  26 26 25 26   GLUCOSE  --  89 72 93 92  BUN  --  24* 21* 16 15  CREATININE  --  0.55 0.73 0.56 0.52  CALCIUM  --  9.1  9.0 8.7* 8.8*  MG 1.4* 2.6*  --   --   --   PHOS 2.9 2.5  --   --   --    Liver Function Tests:  Recent Labs  11/22/14 0305  AST 36  ALT 70*  ALKPHOS 71  BILITOT 0.9  PROT 5.6*  ALBUMIN 2.9*   No results for input(s): LIPASE, AMYLASE in the last 8760 hours. No results for input(s): AMMONIA in the last 8760 hours. CBC:  Recent Labs  11/20/14 1753 11/21/14 0016 11/22/14 0305 11/24/14 0428  WBC 6.8 8.5 6.5 4.5  NEUTROABS 5.0  --  4.7  --   HGB 10.9* 10.1* 9.5* 8.4*  HCT 32.6* 29.7* 27.8* 25.5*  MCV 95.6 94.3 93.3 95.9  PLT PLATELET CLUMPS NOTED ON SMEAR, COUNT APPEARS ADEQUATE PLATELET CLUMPS NOTED ON SMEAR, COUNT APPEARS DECREASED PLATELET CLUMPS NOTED ON SMEAR, UNABLE TO ESTIMATE PLATELET CLUMPING, SUGGEST RECOLLECTION OF SAMPLE IN CITRATE TUBE.   Cardiac Enzymes:  Recent Labs  11/20/14 1753  TROPONINI <0.03   BNP: Invalid input(s): POCBNP CBG:  Recent Labs  11/24/14 1637 11/25/14 0628 11/25/14 1134  GLUCAP 99 129* 93     Assessment/Plan  Physical deconditioning Will have her work with physical therapy and occupational therapy team to help with gait training and muscle strengthening exercises.fall precautions. Skin care. Encourage to be out of bed.   CVA with paraparesis Continue aspirin 325 mg daily and lipitor 40 mg daily. Continue tramadol 50 mg q8h prn pain and start baclofen 5 mg daily for now and monitor  Leg spasticity Start baclofen 5 mg  daily and monitor  Right knee swelling With pain. No signs of infection on exam. Get xray right knee and start tylenol 650 mg tid x 5 days and reassess  Right leg pain Post trauma with healing wound with scab to right lower leg. Tylenol as above for now and monitor  Protein calorie malnutrition Continue medpass supplement, monitor po intake and routine weight  Iron def anemia Continue ferrous sulfate 325 mg bid, check cbc  Hyperlipidemia Currently on lipitor 40 mg daily, no recent lipid panel for review, check lipid panel  Allergic rhinitis Continue diphenhydramine and loratadine for now and monitor  Seizure disorder Continue keppra 1000 mg bid for now, remains seizure free  HTN Elevated SBP, check bp daily for 1 week. Continue lisinopril 5 mg daily  DM Lab Results  Component Value Date   HGBA1C 5.9* 05/02/2014  off all diabetic meds at present. Monitor clinically.     Goals of care: long term care  Labs/tests ordered: cbc, cmp, lipid panel, tsh  Family/ staff Communication: reviewed care plan with patient and nursing supervisor    Blanchie Serve, MD Internal Medicine [address], [place] [address] Cell Phone (Monday-Friday 8 am - 5 pm): [phone] On Call: [phone] and follow prompts after 5 pm and on weekends Office Phone: [phone] Office Fax: [phone]

## 2015-07-05 LAB — HEPATIC FUNCTION PANEL
ALT: 18 U/L (ref 7–35)
AST: 16 U/L (ref 13–35)
Alkaline Phosphatase: 91 U/L (ref 25–125)
Bilirubin, Total: 0.4 mg/dL

## 2015-07-05 LAB — CBC AND DIFFERENTIAL
HEMATOCRIT: 33 % — AB (ref 36–46)
Hemoglobin: 10.8 g/dL — AB (ref 12.0–16.0)
WBC: 5.7 10*3/mL

## 2015-07-05 LAB — LIPID PANEL
CHOLESTEROL: 153 mg/dL (ref 0–200)
HDL: 50 mg/dL (ref 35–70)
LDL Cholesterol: 91 mg/dL
TRIGLYCERIDES: 57 mg/dL (ref 40–160)

## 2015-07-05 LAB — BASIC METABOLIC PANEL
BUN: 22 mg/dL — AB (ref 4–21)
Creatinine: 0.5 mg/dL (ref 0.5–1.1)
Glucose: 118 mg/dL
Potassium: 3.7 mmol/L (ref 3.4–5.3)
Sodium: 142 mmol/L (ref 137–147)

## 2015-08-01 ENCOUNTER — Non-Acute Institutional Stay: Payer: Medicare HMO | Admitting: Family

## 2015-08-01 DIAGNOSIS — E785 Hyperlipidemia, unspecified: Secondary | ICD-10-CM | POA: Diagnosis not present

## 2015-08-01 DIAGNOSIS — R569 Unspecified convulsions: Secondary | ICD-10-CM

## 2015-08-01 DIAGNOSIS — E1149 Type 2 diabetes mellitus with other diabetic neurological complication: Secondary | ICD-10-CM

## 2015-08-01 DIAGNOSIS — I10 Essential (primary) hypertension: Secondary | ICD-10-CM | POA: Diagnosis not present

## 2015-08-01 DIAGNOSIS — D649 Anemia, unspecified: Secondary | ICD-10-CM | POA: Diagnosis not present

## 2015-08-01 DIAGNOSIS — K5901 Slow transit constipation: Secondary | ICD-10-CM | POA: Diagnosis not present

## 2015-08-01 MED ORDER — SENNOSIDES-DOCUSATE SODIUM 8.6-50 MG PO TABS: 2.0000 | ORAL_TABLET | Freq: Every day | ORAL | Status: DC

## 2015-08-01 NOTE — Progress Notes (Signed)
Patient ID: [name] [name], female   DOB: [birth year], 60 y.o.   MRN: [id number]  Location:  [facility] of Service:  SNF (31) Provider:Mahima pandey, MD   Helane Rima, MD  Patient Care Team: Helane Rima, MD as PCP - General (Family Medicine)  Extended Emergency Contact Information Primary Emergency Contact: Kugler,Lauren Address: [place], [place] of [place] Phone: [phone] Relation: Daughter  Code Status:  Full Code  Goals of care: Advanced Directive information Advanced Directives 01/08/2015  Does patient have an advance directive? No  Would patient like information on creating an advanced directive? No - patient declined information     Chief Complaint  Patient presents with  . Medical Management of Chronic Issues    HPI:  Pt is a 60 y.o. female seen today at [facility]  for medical management of chronic diseases. She has a medical history of Stroke, HTN, Hyperlipidemia, Seizures, Anemia, Type 2 DM among others. She is seen in her today watching TV. She denies any acute issues. States still has left index finger painful spasm working well with Therapy.Facility staff reports no new concerns.    Past Medical History  Diagnosis Date  . Uterine fibroid   . Gastritis   . Hiatal hernia   . Hemorrhoids   . Anemia   . Stroke ([place]) 12 1989  . Dyslipidemia   . Hypertension   . Seizure disorder ([place])   . Carpal tunnel syndrome   . Depression   . Osteoporosis   . Urinary incontinence   . Iron deficiency anemia 2008    negative EGD and colonoscopy  . Diabetes mellitus   . Seizures ([place])   . Hyperlipidemia    Past Surgical History  Procedure Laterality Date  . Colonoscopy  2008  . Upper gastrointestinal endoscopy  2008  . Tracheostomy    . Breast surgery  1977  . Tubal ligation    . Cesarean section      x2  . Tracheostomy closure      Allergies  Allergen Reactions  .  Amoxicillin Itching  . Cephalexin Itching  . Penicillins Itching      Medication List       This list is accurate as of: 08/01/15  4:46 PM.  Always use your most recent med list.               aspirin 325 MG tablet  Take 325 mg by mouth daily. For hypertension.     atorvastatin 40 MG tablet  Commonly known as:  LIPITOR  Take 40 mg by mouth daily at 12 noon. For hyperlipidemia.     diphenhydrAMINE 25 MG tablet  Commonly known as:  SOMINEX  Take 25 mg by mouth at bedtime as needed for sleep. For allergies.     ferrous sulfate 325 (65 FE) MG tablet  Take 325 mg by mouth 2 (two) times daily.     levETIRAcetam 1000 MG tablet  Commonly known as:  KEPPRA  Take 1,000 mg by mouth 2 (two) times daily. For conversion disorder with seizures or convulsions.     lisinopril 5 MG tablet  Commonly known as:  PRINIVIL,ZESTRIL  Take 5 mg by mouth daily. For hypertension.     loratadine 10 MG tablet  Commonly known as:  CLARITIN  Take 10 mg by mouth every morning. For allergies.     multivitamin  with minerals Tabs tablet  Take 1 tablet by mouth daily.     traMADol 50 MG tablet  Commonly known as:  ULTRAM  Take one tablet by mouth every 8 hours as needed for pain     UNABLE TO FIND  Med Name: Med pass 30 mL 3 times daily for supplement     zinc oxide 11.3 % Crea cream  Commonly known as:  BALMEX  Apply 1 application topically daily as needed. Cleanse buttocks and apply afterwards        Review of Systems  Constitutional: Negative for fever, chills, activity change, appetite change and fatigue.  HENT: Negative for congestion, rhinorrhea, sinus pressure, sneezing and sore throat.   Eyes: Negative.   Respiratory: Negative.   Cardiovascular: Positive for leg swelling. Negative for chest pain and palpitations.  Gastrointestinal: Positive for constipation. Negative for nausea, vomiting, abdominal pain, diarrhea and abdominal distention.  Endocrine: Negative.   Genitourinary:  Negative.   Musculoskeletal: Positive for gait problem.  Skin: Negative.   Allergic/Immunologic: Negative.   Neurological: Negative for dizziness, seizures, light-headedness and headaches.       Left side weakness.   Psychiatric/Behavioral: Negative.     Immunization History  Administered Date(s) Administered  . Influenza,inj,Quad PF,36+ Mos 05/03/2014  . Influenza-Unspecified 12/26/2013  . PPD Test 02/06/2015   Pertinent  Health Maintenance Due  Topic Date Due  . OPHTHALMOLOGY EXAM  05/21/1967  . PAP SMEAR  05/20/1978  . COLONOSCOPY  05/21/2007  . HEMOGLOBIN A1C  10/31/2014  . MAMMOGRAM  10/26/2015  . INFLUENZA VACCINE  11/26/2015  . FOOT EXAM  05/13/2016   Fall Risk  01/08/2015  Falls in the past year? Yes  Number falls in past yr: 1  Injury with Fall? No  Risk for fall due to : Impaired mobility  Risk for fall due to (comments): wheel chair  Follow up Falls prevention discussed   Functional Status Survey:    Filed Vitals:   08/01/15 1627  BP: 134/79  Pulse: 89  Temp: 98.3 F (36.8 C)  Resp: 20  Height: 5\' 7"  (1.702 m)  Weight: 165 lb 12.8 oz (75.206 kg)  SpO2: 98%   Body mass index is 25.96 kg/(m^2). Physical Exam  Constitutional: She is oriented to person, place, and time. She appears well-developed and well-nourished. No distress.  HENT:  Head: Normocephalic.  Mouth/Throat: Oropharynx is clear and moist.  Bilateral ear partial cerumen impaction.   Eyes: Conjunctivae and EOM are normal. Pupils are equal, round, and reactive to light. Right eye exhibits no discharge. Left eye exhibits no discharge. No scleral icterus.  Neck: Normal range of motion. No JVD present. No thyromegaly present.  Cardiovascular: Normal rate, regular rhythm, normal heart sounds and intact distal pulses.  Exam reveals no gallop and no friction rub.   No murmur heard. Pulmonary/Chest: Effort normal and breath sounds normal. No respiratory distress. She has no wheezes. She has no  rales.  Abdominal: Soft. Bowel sounds are normal. She exhibits no distension and no mass. There is no tenderness. There is no rebound and no guarding.  Musculoskeletal: She exhibits edema. She exhibits no tenderness.  Left index finger stiffness and Limited ROM. Left LE weakness with limited ROM.   Lymphadenopathy:    She has no cervical adenopathy.  Neurological: She is oriented to person, place, and time.  Skin: Skin is warm and dry. No rash noted. No erythema. No pallor.  Psychiatric: She has a normal mood and affect.    Labs  reviewed:  Recent Labs  11/20/14 2000 11/21/14 0016 11/22/14 0305 11/23/14 0232 11/24/14 0428  NA  --  142 140 141 141  K  --  3.8 3.8 3.5 4.0  CL  --  110 108 110 111  CO2  --  26 26 25 26   GLUCOSE  --  89 72 93 92  BUN  --  24* 21* 16 15  CREATININE  --  0.55 0.73 0.56 0.52  CALCIUM  --  9.1 9.0 8.7* 8.8*  MG 1.4* 2.6*  --   --   --   PHOS 2.9 2.5  --   --   --     Recent Labs  11/22/14 0305  AST 36  ALT 70*  ALKPHOS 71  BILITOT 0.9  PROT 5.6*  ALBUMIN 2.9*    Recent Labs  11/20/14 1753 11/21/14 0016 11/22/14 0305 11/24/14 0428  WBC 6.8 8.5 6.5 4.5  NEUTROABS 5.0  --  4.7  --   HGB 10.9* 10.1* 9.5* 8.4*  HCT 32.6* 29.7* 27.8* 25.5*  MCV 95.6 94.3 93.3 95.9  PLT PLATELET CLUMPS NOTED ON SMEAR, COUNT APPEARS ADEQUATE PLATELET CLUMPS NOTED ON SMEAR, COUNT APPEARS DECREASED PLATELET CLUMPS NOTED ON SMEAR, UNABLE TO ESTIMATE PLATELET CLUMPING, SUGGEST RECOLLECTION OF SAMPLE IN CITRATE TUBE.   Lab Results  Component Value Date   TSH 5.948* 05/01/2014   Lab Results  Component Value Date   HGBA1C 5.9* 05/02/2014   Lab Results  Component Value Date   CHOL  03/24/2010    115        ATP III CLASSIFICATION:  <200     mg/dL   Desirable  200-239  mg/dL   Borderline High  >=240    mg/dL   High          HDL 43 03/24/2010   LDLCALC  03/24/2010    59        Total Cholesterol/HDL:CHD Risk Coronary Heart Disease Risk Table                      Men   Women  1/2 Average Risk   3.4   3.3  Average Risk       5.0   4.4  2 X Average Risk   9.6   7.1  3 X Average Risk  23.4   11.0        Use the calculated Patient Ratio above and the CHD Risk Table to determine the patient's CHD Risk.        ATP III CLASSIFICATION (LDL):  <100     mg/dL   Optimal  100-129  mg/dL   Near or Above                    Optimal  130-159  mg/dL   Borderline  160-189  mg/dL   High  >190     mg/dL   Very High   TRIG 56 11/21/2014   CHOLHDL 2.7 03/24/2010    Significant Diagnostic Results in last 30 days:  No results found.  Assessment/Plan 1. Seizures ([place]) No recent seizure activity reported. Continue on LevEtiracetam 1000 mg twice daily. Keppra level  08/02/2015.   2. Type II diabetes mellitus with neurological manifestations ([place]) Currently diet controlled.CGB's stable averages 100's-130's. Recent Hgb A1C 5.9 ( 05/02/2014). Continue ACE inhibitor. Hgb A1C 08/02/2015 .  3. Essential hypertension, benign B/p stable. Continue on Lisinopril 5 mg Tablet daily.   4. HLD (hyperlipidemia)  Lipid panel within normal. Will reduce Atorvastatin to 20 mg Tablet at 12 noon.   5. Normocytic anemia Currently on Ferrous sulfate twice daily. CBC 08/02/2015  6. Constipation: Reports hard stool with ferrous sulfate. Will add senna-Doc 8.6-50 mg Tablet at bedtime. Continue to monitor.     Family/ staff Communication: Reviewed plan of Care with patient and facility Nurse supervisor.   Labs/tests ordered: CBC, BMP, Vit D and Keppra level  08/02/2015

## 2015-08-02 LAB — BASIC METABOLIC PANEL
BUN: 21 mg/dL (ref 4–21)
CREATININE: 0.6 mg/dL (ref 0.5–1.1)
GLUCOSE: 98 mg/dL
Potassium: 3.6 mmol/L (ref 3.4–5.3)
Sodium: 142 mmol/L (ref 137–147)

## 2015-08-02 LAB — CBC AND DIFFERENTIAL
HCT: 36 % (ref 36–46)
Hemoglobin: 11.7 g/dL — AB (ref 12.0–16.0)
PLATELETS: 0 10*3/uL — AB (ref 150–399)
WBC: 6.8 10*3/mL

## 2015-08-02 LAB — HEMOGLOBIN A1C: Hemoglobin A1C: 5.8

## 2015-08-05 ENCOUNTER — Encounter: Payer: Self-pay | Admitting: Family

## 2015-08-05 ENCOUNTER — Non-Acute Institutional Stay: Payer: Medicare HMO | Admitting: Family

## 2015-08-05 DIAGNOSIS — E1149 Type 2 diabetes mellitus with other diabetic neurological complication: Secondary | ICD-10-CM

## 2015-08-05 DIAGNOSIS — D649 Anemia, unspecified: Secondary | ICD-10-CM | POA: Diagnosis not present

## 2015-08-05 DIAGNOSIS — I10 Essential (primary) hypertension: Secondary | ICD-10-CM | POA: Diagnosis not present

## 2015-08-05 DIAGNOSIS — E785 Hyperlipidemia, unspecified: Secondary | ICD-10-CM | POA: Diagnosis not present

## 2015-08-05 DIAGNOSIS — R569 Unspecified convulsions: Secondary | ICD-10-CM

## 2015-08-05 NOTE — Progress Notes (Signed)
Location:  [facility] Room Number: [place] of Service:  SNF [phone])  Provider: Marlowe Sax, FNP-C Blanchie Serve, MD   PCP: Helane Rima, MD Patient Care Team: Helane Rima, MD as PCP - General (Family Medicine)  Extended Emergency Contact Information Primary Emergency Contact: Mandelbaum,Lauren Address: [place], [place] Phone: [phone] Relation: Daughter  Code Status: Full Code  Goals of care:  Advanced Directive information Advanced Directives 01/08/2015  Does patient have an advance directive? No  Would patient like information on creating an advanced directive? No - patient declined information     Allergies  Allergen Reactions  . Amoxicillin Itching  . Cephalexin Itching  . Penicillins Itching    Chief Complaint  Patient presents with  . Discharge Note    Discharged from SNF    HPI:  60 y.o. female  Seen today at [facility] for discharge to [facility].She was here for long term care.She has a medical history of Stroke, HTN, Hyperlipidemia, Seizures, Anemia, Type 2 DM among others. She is seen in her room today. She denies any acute issues this visit. Facility staff reports no concerns. She has worked with PT/OT and was discharged to transition to Restorative therapy.     Past Medical History  Diagnosis Date  . Uterine fibroid   . Gastritis   . Hiatal hernia   . Hemorrhoids   . Anemia   . Stroke ([place]) 12 1989  . Dyslipidemia   . Hypertension   . Seizure disorder ([place])   . Carpal tunnel syndrome   . Depression   . Osteoporosis   . Urinary incontinence   . Iron deficiency anemia 2008    negative EGD and colonoscopy  . Diabetes mellitus   . Seizures ([place])   . Hyperlipidemia     Past Surgical History  Procedure Laterality Date  . Colonoscopy  2008  . Upper gastrointestinal endoscopy  2008  . Tracheostomy    . Breast surgery  1977  .  Tubal ligation    . Cesarean section      x2  . Tracheostomy closure        reports that she has never smoked. She has never used smokeless tobacco. She reports that she does not drink alcohol or use illicit drugs. Social History   Social History  . Marital Status: Divorced    Spouse Name: N/A  . Number of Children: 2  . Years of Education: N/A   Occupational History  . Disabled    Social History Main Topics  . Smoking status: Never Smoker   . Smokeless tobacco: Never Used  . Alcohol Use: No  . Drug Use: No  . Sexual Activity: Not on file   Other Topics Concern  . Not on file   Social History Narrative   Size and skilled nursing facility secondary to multiple previous strokes and lack of family to care for her at home   Functional Status Survey:    Allergies  Allergen Reactions  . Amoxicillin Itching  . Cephalexin Itching  . Penicillins Itching    Pertinent  Health Maintenance Due  Topic Date Due  . OPHTHALMOLOGY EXAM  05/21/1967  . PAP SMEAR  05/20/1978  . COLONOSCOPY  05/21/2007  . HEMOGLOBIN A1C  10/31/2014  . MAMMOGRAM  10/26/2015  . INFLUENZA VACCINE  11/26/2015  . FOOT EXAM  05/13/2016  Medications:   Medication List       This list is accurate as of: 08/05/15  4:41 PM.  Always use your most recent med list.               aspirin 325 MG tablet  Take 325 mg by mouth daily. For hypertension.     atorvastatin 20 MG tablet  Commonly known as:  LIPITOR  Take 20 mg by mouth daily.     baclofen 10 MG tablet  Commonly known as:  LIORESAL  Take 5 mg by mouth daily.     diphenhydrAMINE 25 MG tablet  Commonly known as:  SOMINEX  Take 25 mg by mouth at bedtime as needed for sleep. For allergies.     ferrous sulfate 325 (65 FE) MG tablet  Take 325 mg by mouth 2 (two) times daily.     levETIRAcetam 1000 MG tablet  Commonly known as:  KEPPRA  Take 1,000 mg by mouth 2 (two) times daily. For conversion disorder with seizures or convulsions.       lisinopril 5 MG tablet  Commonly known as:  PRINIVIL,ZESTRIL  Take 5 mg by mouth daily. For hypertension.     loratadine 10 MG tablet  Commonly known as:  CLARITIN  Take 10 mg by mouth every morning. For allergies.     multivitamin with minerals Tabs tablet  Take 1 tablet by mouth daily.     senna-docusate 8.6-50 MG tablet  Commonly known as:  Senokot-S  Take 2 tablets by mouth at bedtime.     traMADol 50 MG tablet  Commonly known as:  ULTRAM  Take one tablet by mouth every 8 hours as needed for pain     zinc oxide 11.3 % Crea cream  Commonly known as:  BALMEX  Apply 1 application topically daily as needed. Cleanse buttocks and apply afterwards        Review of Systems  Constitutional: Negative for fever, chills, activity change, appetite change and fatigue.  HENT: Negative for congestion, rhinorrhea, sinus pressure, sneezing and sore throat.   Eyes: Negative.   Respiratory: Negative.   Cardiovascular: Positive for leg swelling. Negative for chest pain and palpitations.  Gastrointestinal: Negative for nausea, vomiting, abdominal pain, diarrhea, constipation and abdominal distention.  Endocrine: Negative.   Genitourinary: Negative.   Musculoskeletal: Positive for gait problem.  Skin: Negative.   Allergic/Immunologic: Negative.   Neurological: Negative for dizziness, seizures, light-headedness and headaches.       Left side weakness.   Psychiatric/Behavioral: Negative.     Filed Vitals:   08/05/15 1613  BP: 134/79  Pulse: 74  Temp: 98.3 F (36.8 C)  Resp: 20  Height: 5\' 7"  (1.702 m)  Weight: 165 lb 12.8 oz (75.206 kg)   Body mass index is 25.96 kg/(m^2). Physical Exam  Constitutional: She appears well-developed and well-nourished. No distress.  HENT:  Head: Normocephalic.  Mouth/Throat: Oropharynx is clear and moist.  Eyes: Conjunctivae and EOM are normal. Pupils are equal, round, and reactive to light. Right eye exhibits no discharge. Left eye exhibits no  discharge. No scleral icterus.  Neck: Normal range of motion. No JVD present. No thyromegaly present.  Cardiovascular: Normal rate, regular rhythm, normal heart sounds and intact distal pulses.  Exam reveals no gallop and no friction rub.   No murmur heard. Pulmonary/Chest: Effort normal and breath sounds normal. No respiratory distress. She has no wheezes. She has no rales.  Abdominal: Soft. Bowel sounds are normal. She exhibits no distension  and no mass. There is no tenderness. There is no rebound and no guarding.  Musculoskeletal: She exhibits no tenderness.  Normal ROM to UEs except limited on right shoulder and bilateral lower extremities. Self propel on Wheelchair.   Lymphadenopathy:    She has no cervical adenopathy.    Labs reviewed: Basic Metabolic Panel:  Recent Labs  11/20/14 2000 11/21/14 0016 11/22/14 0305 11/23/14 0232 11/24/14 0428 07/05/15  NA  --  142 140 141 141 142  K  --  3.8 3.8 3.5 4.0 3.7  CL  --  110 108 110 111  --   CO2  --  26 26 25 26   --   GLUCOSE  --  89 72 93 92  --   BUN  --  24* 21* 16 15 22*  CREATININE  --  0.55 0.73 0.56 0.52 0.5  CALCIUM  --  9.1 9.0 8.7* 8.8*  --   MG 1.4* 2.6*  --   --   --   --   PHOS 2.9 2.5  --   --   --   --    Liver Function Tests:  Recent Labs  11/22/14 0305 07/05/15  AST 36 16  ALT 70* 18  ALKPHOS 71 91  BILITOT 0.9  --   PROT 5.6*  --   ALBUMIN 2.9*  --    No results for input(s): LIPASE, AMYLASE in the last 8760 hours. No results for input(s): AMMONIA in the last 8760 hours. CBC:  Recent Labs  11/20/14 1753 11/21/14 0016 11/22/14 0305 11/24/14 0428 07/05/15  WBC 6.8 8.5 6.5 4.5 5.7  NEUTROABS 5.0  --  4.7  --   --   HGB 10.9* 10.1* 9.5* 8.4* 10.8*  HCT 32.6* 29.7* 27.8* 25.5* 33*  MCV 95.6 94.3 93.3 95.9  --   PLT PLATELET CLUMPS NOTED ON SMEAR, COUNT APPEARS ADEQUATE PLATELET CLUMPS NOTED ON SMEAR, COUNT APPEARS DECREASED PLATELET CLUMPS NOTED ON SMEAR, UNABLE TO ESTIMATE PLATELET CLUMPING,  SUGGEST RECOLLECTION OF SAMPLE IN CITRATE TUBE.  --    Cardiac Enzymes:  Recent Labs  11/20/14 1753  TROPONINI <0.03   BNP: Invalid input(s): POCBNP CBG:  Recent Labs  11/24/14 1637 11/25/14 0628 11/25/14 1134  GLUCAP 99 129* 93    Procedures and Imaging Studies During Stay: No results found.  Assessment/Plan:   1.HTN B/p stable. Continue on Lisinopril. Monitor BMP.  2.Type 2 DM   CBG's stable averages 80's-120's.Diet controlled. Continue to monitor Hgb A1C. Hgb A1C results pending ordered 08/02/2015 3. Seizure  No recent seizures episodes. Continue on Keppra. Keppra level pending 08/02/2015. 4. Anemia  Has improved. Recent Hgb 10.8 Continue on Ferrous sulfate. Monitor CBC 5. Hyperlipidemia Stable. Atorvastatin reduced to 20 mg Tablet 07/08/2015. Continue to monitor lipid panel.     Discharged with Meds to [facility].   Future labs/tests needed:  Reviewed plan of care with patient and facility Nurse supervisor.

## 2015-08-07 ENCOUNTER — Encounter: Payer: Self-pay | Admitting: Internal Medicine

## 2015-08-07 ENCOUNTER — Non-Acute Institutional Stay (SKILLED_NURSING_FACILITY): Payer: Medicare HMO | Admitting: Internal Medicine

## 2015-08-07 DIAGNOSIS — G40909 Epilepsy, unspecified, not intractable, without status epilepticus: Secondary | ICD-10-CM

## 2015-08-07 DIAGNOSIS — J3089 Other allergic rhinitis: Secondary | ICD-10-CM | POA: Diagnosis not present

## 2015-08-07 DIAGNOSIS — E46 Unspecified protein-calorie malnutrition: Secondary | ICD-10-CM

## 2015-08-07 DIAGNOSIS — R946 Abnormal results of thyroid function studies: Secondary | ICD-10-CM | POA: Diagnosis not present

## 2015-08-07 DIAGNOSIS — K5900 Constipation, unspecified: Secondary | ICD-10-CM

## 2015-08-07 DIAGNOSIS — I10 Essential (primary) hypertension: Secondary | ICD-10-CM

## 2015-08-07 DIAGNOSIS — D509 Iron deficiency anemia, unspecified: Secondary | ICD-10-CM

## 2015-08-07 DIAGNOSIS — R7989 Other specified abnormal findings of blood chemistry: Secondary | ICD-10-CM

## 2015-08-07 DIAGNOSIS — R4189 Other symptoms and signs involving cognitive functions and awareness: Secondary | ICD-10-CM | POA: Diagnosis not present

## 2015-08-07 DIAGNOSIS — G8220 Paraplegia, unspecified: Secondary | ICD-10-CM

## 2015-08-07 DIAGNOSIS — M62838 Other muscle spasm: Secondary | ICD-10-CM | POA: Diagnosis not present

## 2015-08-07 DIAGNOSIS — E1149 Type 2 diabetes mellitus with other diabetic neurological complication: Secondary | ICD-10-CM

## 2015-08-07 DIAGNOSIS — E785 Hyperlipidemia, unspecified: Secondary | ICD-10-CM | POA: Diagnosis not present

## 2015-08-07 NOTE — Progress Notes (Signed)
LOCATION: [facility]   Code Status: Full Code  Goals of care: Advanced Directive information Advanced Directives 01/08/2015  Does patient have an advance directive? No  Would patient like information on creating an advanced directive? No - patient declined information    Extended Emergency Contact Information Primary Emergency Contact: Strnad,Lauren Address: [place], [place] of [place] Phone: [phone] Relation: Daughter   Allergies  Allergen Reactions  . Amoxicillin Itching  . Cephalexin Itching  . Penicillins Itching    Chief Complaint  Patient presents with  . New Admit To SNF    New Admission     HPI:  Patient is a 60 y.o. female seen today for long term care from another facility. She has PMH of HTN, CVA, type 2 DM, HLD, seizure among others. She is seen in her room today. She denies any concerns  Review of Systems:  Constitutional: Negative for fever, chills, diaphoresis. Energy level is good.  HENT: Negative for headache,  nasal discharge, hearing loss, sore throat, difficulty swallowing. Positive for seasonal allergies.   Eyes: Negative for blurred vision, double vision and discharge. Wears glasses. Respiratory: Negative for cough, shortness of breath and wheezing.   Cardiovascular: Negative for chest pain, palpitations, leg swelling.  Gastrointestinal: Negative for heartburn, nausea, vomiting, abdominal pain, loss of appetite, melena, diarrhea and constipation. Last bowel movement was this morning. Genitourinary: Negative for dysuria and flank pain.  Musculoskeletal: Negative for back pain, fall in the facility.  Skin: Negative for itching, rash.  Neurological: Negative for weakness and dizziness. Psychiatric/Behavioral: Negative for depression    Past Medical History  Diagnosis Date  . Uterine fibroid   . Gastritis   . Hiatal hernia   . Hemorrhoids   . Anemia   . Stroke ([place]) 12 1989  . Dyslipidemia    . Hypertension   . Seizure disorder ([place])   . Carpal tunnel syndrome   . Depression   . Osteoporosis   . Urinary incontinence   . Iron deficiency anemia 2008    negative EGD and colonoscopy  . Diabetes mellitus   . Seizures ([place])   . Hyperlipidemia    Past Surgical History  Procedure Laterality Date  . Colonoscopy  2008  . Upper gastrointestinal endoscopy  2008  . Tracheostomy    . Breast surgery  1977  . Tubal ligation    . Cesarean section      x2  . Tracheostomy closure     Social History:   reports that she has never smoked. She has never used smokeless tobacco. She reports that she does not drink alcohol or use illicit drugs.  Family History  Problem Relation Age of Onset  . Heart disease Paternal Grandfather   . Hypertension Paternal Grandfather   . Prostate cancer Paternal Uncle   . Ovarian cancer Mother   . Diabetes Cousin   . Colon cancer Neg Hx   . Hypertension Maternal Grandmother   . Stroke Maternal Grandmother   . Diabetes Brother   . Heart attack Brother     Medications:   Medication List       This list is accurate as of: 08/07/15  2:23 PM.  Always use your most recent med list.               aspirin 325 MG tablet  Take 325 mg by mouth daily. For hypertension.     atorvastatin 20  MG tablet  Commonly known as:  LIPITOR  Take 20 mg by mouth daily.     baclofen 10 MG tablet  Commonly known as:  LIORESAL  Take 5 mg by mouth daily.     diphenhydrAMINE 25 MG tablet  Commonly known as:  SOMINEX  Take 25 mg by mouth at bedtime as needed for sleep. For allergies.     ferrous sulfate 325 (65 FE) MG tablet  Take 325 mg by mouth 2 (two) times daily.     levETIRAcetam 1000 MG tablet  Commonly known as:  KEPPRA  Take 1,000 mg by mouth 2 (two) times daily. For conversion disorder with seizures or convulsions.     lisinopril 5 MG tablet  Commonly known as:  PRINIVIL,ZESTRIL  Take 5 mg by mouth daily. For hypertension.     loratadine 10 MG  tablet  Commonly known as:  CLARITIN  Take 10 mg by mouth every morning. For allergies.     multivitamin with minerals Tabs tablet  Take 1 tablet by mouth daily.     senna-docusate 8.6-50 MG tablet  Commonly known as:  Senokot-S  Take 2 tablets by mouth at bedtime.     traMADol 50 MG tablet  Commonly known as:  ULTRAM  Take one tablet by mouth every 8 hours as needed for pain     zinc oxide 11.3 % Crea cream  Commonly known as:  BALMEX  Apply 1 application topically 3 (three) times daily as needed. Cleanse buttocks and apply afterwards        Immunizations: Immunization History  Administered Date(s) Administered  . Influenza,inj,Quad PF,36+ Mos 05/03/2014  . Influenza-Unspecified 12/26/2013  . PPD Test 02/06/2015, 07/12/2015     Physical Exam: Filed Vitals:   08/07/15 1418  BP: 154/98  Pulse: 86  Temp: 97.9 F (36.6 C)  TempSrc: Oral  Resp: 18  Height: 5\' 7"  (1.702 m)  Weight: 165 lb 12.8 oz (75.206 kg)  SpO2: 94%   Body mass index is 25.96 kg/(m^2).  General- adult female, well built, in no acute distress Head- normocephalic, atraumatic Nose- no maxillary or frontal sinus tenderness, no nasal discharge Throat- moist mucus membrane Eyes- PERRLA, EOMI, no pallor, no icterus, no discharge, normal conjunctiva, normal sclera Neck- no cervical lymphadenopathy Cardiovascular- normal s1,s2, no murmur, no leg edema Respiratory- bilateral clear to auscultation, no wheeze, no rhonchi, no crackles, no use of accessory muscles Abdomen- bowel sounds present, soft, non tender Musculoskeletal- able to move her upper extremities with ROM limited in right shoulder, limited movement to LE with weakness and increased tone noted, needs assistance with transfers Neurological- alert and oriented to person, place and time Skin- warm and dry Psychiatry- normal mood and affect        Labs reviewed: Basic Metabolic Panel:  Recent Labs  11/20/14 2000 11/21/14 0016  11/22/14 0305 11/23/14 0232 11/24/14 0428 07/05/15  NA  --  142 140 141 141 142  K  --  3.8 3.8 3.5 4.0 3.7  CL  --  110 108 110 111  --   CO2  --  26 26 25 26   --   GLUCOSE  --  89 72 93 92  --   BUN  --  24* 21* 16 15 22*  CREATININE  --  0.55 0.73 0.56 0.52 0.5  CALCIUM  --  9.1 9.0 8.7* 8.8*  --   MG 1.4* 2.6*  --   --   --   --   PHOS  2.9 2.5  --   --   --   --    Liver Function Tests:  Recent Labs  11/22/14 0305 07/05/15  AST 36 16  ALT 70* 18  ALKPHOS 71 91  BILITOT 0.9  --   PROT 5.6*  --   ALBUMIN 2.9*  --    No results for input(s): LIPASE, AMYLASE in the last 8760 hours. No results for input(s): AMMONIA in the last 8760 hours. CBC:  Recent Labs  11/20/14 1753 11/21/14 0016 11/22/14 0305 11/24/14 0428 07/05/15  WBC 6.8 8.5 6.5 4.5 5.7  NEUTROABS 5.0  --  4.7  --   --   HGB 10.9* 10.1* 9.5* 8.4* 10.8*  HCT 32.6* 29.7* 27.8* 25.5* 33*  MCV 95.6 94.3 93.3 95.9  --   PLT PLATELET CLUMPS NOTED ON SMEAR, COUNT APPEARS ADEQUATE PLATELET CLUMPS NOTED ON SMEAR, COUNT APPEARS DECREASED PLATELET CLUMPS NOTED ON SMEAR, UNABLE TO ESTIMATE PLATELET CLUMPING, SUGGEST RECOLLECTION OF SAMPLE IN CITRATE TUBE.  --    Lab Results  Component Value Date   TSH 5.948* 05/01/2014   Lipid Panel     Component Value Date/Time   CHOL 153 07/05/2015   TRIG 57 07/05/2015   HDL 50 07/05/2015   CHOLHDL 2.7 03/24/2010 0519   VLDL 13 03/24/2010 0519   LDLCALC 91 07/05/2015     Assessment/Plan  musce spasticity Change baclofen to 5 mg bid and monitor  Hyperlipidemia LDL at goal, continue lipitor and monitor  Allergic rhinitis disontinue diphenhydramine. Continue loratadine for now and monitor  Protein calorie malnutrition Continue medpass supplement, monitor po intake and routine weight. Continue MVI  Iron def anemia Continue ferrous sulfate 325 mg bid, monitor cbc q 3 months  Abnormal tsh In jan 2016. Check tsh and free t4  Constipation Continue senokot s and  monitor  CVA with paraparesis Continue aspirin 325 mg daily and lipitor 20 mg daily. Continue tramadol 50 mg q8h prn pain. Change  baclofen to 5 mg twice daily for now and monitor. Have her to work with restorative therapy  Seizure disorder Continue keppra 1000 mg bid for now, remains seizure free  HTN Elevated SBP, change lisinopril to 10 mg daily, check bp bid x 1 week  DM Check a1c. Off all diabetic meds at present. Monitor clinically.   Cognitive impairment MMSE in 11/16 26/30 in neurology clinic, reviewed notes. Has frontal encephalomalacia noted in imaging. Monitor clinically. Continue supportive care    Goals of care: long term care    Labs/tests ordered: tsh, free t4, a1c   Family/ staff Communication: reviewed care plan with patient and nursing supervisor    Blanchie Serve, MD Internal Medicine [place], [place] [address] Cell Phone (Monday-Friday 8 am - 5 pm): [phone] On Call: [phone] and follow prompts after 5 pm and on weekends Office Phone: [phone] Office Fax: [phone]

## 2015-08-08 ENCOUNTER — Non-Acute Institutional Stay: Payer: Medicare HMO | Admitting: Adult Health

## 2015-08-08 ENCOUNTER — Encounter: Payer: Self-pay | Admitting: Adult Health

## 2015-08-08 DIAGNOSIS — E559 Vitamin D deficiency, unspecified: Secondary | ICD-10-CM | POA: Diagnosis not present

## 2015-08-08 LAB — HEMOGLOBIN A1C: Hemoglobin A1C: 5.8

## 2015-08-08 LAB — TSH: TSH: 2.99 u[IU]/mL (ref 0.41–5.90)

## 2015-08-08 NOTE — Progress Notes (Signed)
Patient ID: Genieva Rochel, female   DOB: Jan 12, 1956, 60 y.o.   MRN: [id number]    DATE:     08/08/15  MRN:  [id number]  BIRTHDAY: Aug 29, 1955  Facility:  [facility] Location:  [place] Room Number: [id number]  LEVEL OF CARE:  SNF (31)  Contact Information    Name Relation Home Work [place] Daughter [phone]         Code Status History    Date Active Date Inactive Code Status Order ID Comments User Context   11/20/2014 11:22 PM 11/25/2014  6:00 PM Full Code [id number]  Mosie Lukes Inpatient   05/01/2014  8:29 PM 05/06/2014  6:46 PM Full Code [id number]  Cristal Ford, DO Inpatient   02/22/2014  3:01 AM 02/23/2014  9:07 PM Full Code [id number]  Etta Quill, DO ED   07/24/2013  9:07 PM 07/25/2013 11:25 PM Full Code [id number]  Etta Quill, DO ED       Chief Complaint  Patient presents with  . Acute Visit    Vitamin D deficiency    HISTORY OF PRESENT ILLNESS:  This is a 60 year old female who has been noted to have low Vitamin D level - 19.39. She was recently admitted to [facility] on 08/06/15 from [facility]. She has PMH of hypertension, CVA, diabetes mellitus, hyperlipidemia and seizure. She has been admitted for long-term care.  PAST MEDICAL HISTORY:  Past Medical History  Diagnosis Date  . Uterine fibroid   . Gastritis   . Hiatal hernia   . Hemorrhoids   . Anemia   . Stroke ([place]) 12 1989  . Dyslipidemia   . Hypertension   . Seizure disorder ([employer])   . Carpal tunnel syndrome   . Depression   . Osteoporosis   . Urinary incontinence   . Iron deficiency anemia 2008    negative EGD and colonoscopy  . Diabetes mellitus   . Seizures ([place])   . Hyperlipidemia      CURRENT MEDICATIONS: Reviewed    Medication List       This list is accurate as of: 08/08/15 11:59 PM.  Always use your most recent med list.               aspirin 325 MG tablet  Take 325 mg by mouth daily. For hypertension.     atorvastatin 20 MG tablet  Commonly known as:  LIPITOR  Take 20 mg by mouth daily.     baclofen 5 mg Tabs tablet  Commonly known as:  LIORESAL  Take 5 mg by mouth 2 (two) times daily.     ferrous sulfate 325 (65 FE) MG tablet  Take 325 mg by mouth 2 (two) times daily.     levETIRAcetam 1000 MG tablet  Commonly known as:  KEPPRA  Take 1,000 mg by mouth 2 (two) times daily. For conversion disorder with seizures or convulsions.     lisinopril 10 MG tablet  Commonly known as:  PRINIVIL,ZESTRIL  Take 10 mg by mouth daily. Hold for SBP <110     loratadine 10 MG tablet  Commonly known as:  CLARITIN  Take 10 mg by mouth every morning. For allergies.     multivitamin with minerals Tabs tablet  Take 1 tablet by mouth daily.     senna-docusate 8.6-50 MG tablet  Commonly known as:  Senokot-S  Take 2 tablets by mouth at bedtime.     traMADol 50 MG  tablet  Commonly known as:  ULTRAM  Take one tablet by mouth every 8 hours as needed for pain     Vitamin D3 1000 units Caps  Take 1,000 Units by mouth daily.     zinc oxide 11.3 % Crea cream  Commonly known as:  BALMEX  Apply 1 application topically 3 (three) times daily as needed. Cleanse buttocks and apply afterwards     zinc oxide 11.3 % Crea cream  Commonly known as:  BALMEX  Apply 1 application topically daily. Apply to buttocks after cleansing for protection         Allergies  Allergen Reactions  . Amoxicillin Itching  . Cephalexin Itching  . Penicillins Itching     REVIEW OF SYSTEMS:  GENERAL: no change in appetite, no fatigue, no weight changes, no fever, chills or weakness EYES: Denies change in vision, dry eyes, eye pain, itching or discharge EARS: Denies change in hearing, ringing in ears, or earache NOSE: Denies nasal congestion or epistaxis MOUTH and THROAT: Denies oral discomfort, gingival pain or bleeding, pain from teeth or hoarseness   RESPIRATORY: no cough, SOB, DOE, wheezing, hemoptysis CARDIAC: no  chest pain, or palpitations GI: no abdominal pain, diarrhea, constipation, heart burn, nausea or vomiting GU: Denies dysuria, frequency, hematuria, incontinence, or discharge PSYCHIATRIC: Denies feeling of depression or anxiety. No report of hallucinations, insomnia, paranoia, or agitation   PHYSICAL EXAMINATION  GENERAL APPEARANCE: Well nourished. In no acute distress. Normal body habitus SKIN:  Skin is warm and dry.  HEAD: Normal in size and contour. No evidence of trauma EYES: Lids open and close normally. No blepharitis, entropion or ectropion. PERRL. Conjunctivae are clear and sclerae are white. Lenses are without opacity EARS: Pinnae are normal. Patient hears normal voice tunes of the examiner MOUTH and THROAT: Lips are without lesions. Oral mucosa is moist and without lesions. Tongue is normal in shape, size, and color and without lesions NECK: supple, trachea midline, no neck masses, no thyroid tenderness, no thyromegaly LYMPHATICS: no LAN in the neck, no supraclavicular LAN RESPIRATORY: breathing is even & unlabored, BS CTAB CARDIAC: RRR, no murmur,no extra heart sounds, no edema GI: abdomen soft, normal BS, no masses, no tenderness, no hepatomegaly, no splenomegaly EXTREMITIES:  Able to move BUE and has weakness on BLE PSYCHIATRIC: Alert and oriented X 3. Affect and behavior are appropriate  LABS/RADIOLOGY: Labs reviewed: Basic Metabolic Panel:  Recent Labs  11/20/14 2000 11/21/14 0016 11/22/14 0305 11/23/14 0232 11/24/14 0428 07/05/15 08/02/15  NA  --  142 140 141 141 142 142  K  --  3.8 3.8 3.5 4.0 3.7 3.6  CL  --  110 108 110 111  --   --   CO2  --  26 26 25 26   --   --   GLUCOSE  --  89 72 93 92  --   --   BUN  --  24* 21* 16 15 22* 21  CREATININE  --  0.55 0.73 0.56 0.52 0.5 0.6  CALCIUM  --  9.1 9.0 8.7* 8.8*  --   --   MG 1.4* 2.6*  --   --   --   --   --   PHOS 2.9 2.5  --   --   --   --   --    Liver Function Tests:  Recent Labs  11/22/14 0305  07/05/15  AST 36 16  ALT 70* 18  ALKPHOS 71 91  BILITOT 0.9  --  PROT 5.6*  --   ALBUMIN 2.9*  --    CBC:  Recent Labs  11/20/14 1753 11/21/14 0016 11/22/14 0305 11/24/14 0428 07/05/15 08/02/15  WBC 6.8 8.5 6.5 4.5 5.7 6.8  NEUTROABS 5.0  --  4.7  --   --   --   HGB 10.9* 10.1* 9.5* 8.4* 10.8* 11.7*  HCT 32.6* 29.7* 27.8* 25.5* 33* 36  MCV 95.6 94.3 93.3 95.9  --   --   PLT PLATELET CLUMPS NOTED ON SMEAR, COUNT APPEARS ADEQUATE PLATELET CLUMPS NOTED ON SMEAR, COUNT APPEARS DECREASED PLATELET CLUMPS NOTED ON SMEAR, UNABLE TO ESTIMATE PLATELET CLUMPING, SUGGEST RECOLLECTION OF SAMPLE IN CITRATE TUBE.  --  0*   Lipid Panel:  Recent Labs  07/05/15  HDL 50   Cardiac Enzymes:  Recent Labs  11/20/14 1753  TROPONINI <0.03   CBG:  Recent Labs  11/24/14 1637 11/25/14 0628 11/25/14 1134  GLUCAP 99 129* 93     ASSESSMENT/PLAN:  Vitamin D Deficiency - start Vitamin D3 1000 units 1 tab PO daily    MEDINA-VARGAS,Karlen Barbar, NP [place]

## 2015-08-09 DIAGNOSIS — E559 Vitamin D deficiency, unspecified: Secondary | ICD-10-CM | POA: Insufficient documentation

## 2015-08-14 ENCOUNTER — Encounter: Payer: Self-pay | Admitting: Internal Medicine

## 2015-08-14 NOTE — Progress Notes (Signed)
This encounter was created in error - please disregard.

## 2015-09-20 LAB — HEPATIC FUNCTION PANEL
ALT: 15 U/L (ref 7–35)
AST: 13 U/L (ref 13–35)
Alkaline Phosphatase: 109 U/L (ref 25–125)
Bilirubin, Total: 0.2 mg/dL

## 2015-09-20 LAB — BASIC METABOLIC PANEL
BUN: 20 mg/dL (ref 4–21)
Creatinine: 0.6 mg/dL (ref 0.5–1.1)
Glucose: 144 mg/dL
Potassium: 3.8 mmol/L (ref 3.4–5.3)
Sodium: 142 mmol/L (ref 137–147)

## 2015-09-20 LAB — LIPID PANEL
CHOLESTEROL: 159 mg/dL (ref 0–200)
HDL: 40 mg/dL (ref 35–70)
LDL CALC: 95 mg/dL
Triglycerides: 118 mg/dL (ref 40–160)

## 2015-09-20 LAB — TSH: TSH: 2.37 u[IU]/mL (ref 0.41–5.90)

## 2015-09-20 LAB — HEMOGLOBIN A1C: Hemoglobin A1C: 5.8

## 2015-09-20 LAB — CBC AND DIFFERENTIAL
HEMATOCRIT: 34 % — AB (ref 36–46)
Hemoglobin: 11.4 g/dL — AB (ref 12.0–16.0)
WBC: 6.4 10*3/mL

## 2015-09-24 ENCOUNTER — Telehealth: Payer: Self-pay | Admitting: Neurology

## 2015-09-24 NOTE — Telephone Encounter (Signed)
Message For: OFC                  Taken 30-MAY-17 at 10:17AM by JPO ------------------------------------------------------------  Caller  JESSICA/[facility]        CID  [id number]   Patient  [name] [name]           Pt's Dr  UNSURE        Area Code  [id number] APPT WITH YOUR OFFICE                                                                      Disp:Y/N  N  If Y = C/B If No Response In 9minutes  ============================================================  I spoke with Janett Billow , she would like to bring pt in on Wednesday June 7th at 730am. Can the spot be opened for the pt? Pleas call Janett Billow back to let her know.

## 2015-09-24 NOTE — Telephone Encounter (Signed)
Appt scheduled as requested. Left VM mssg to notify [facility].

## 2015-09-25 ENCOUNTER — Non-Acute Institutional Stay (SKILLED_NURSING_FACILITY): Payer: Medicare Other | Admitting: Internal Medicine

## 2015-09-25 ENCOUNTER — Encounter: Payer: Self-pay | Admitting: Internal Medicine

## 2015-09-25 DIAGNOSIS — E559 Vitamin D deficiency, unspecified: Secondary | ICD-10-CM | POA: Diagnosis not present

## 2015-09-25 DIAGNOSIS — E119 Type 2 diabetes mellitus without complications: Secondary | ICD-10-CM | POA: Diagnosis not present

## 2015-09-25 DIAGNOSIS — Z1239 Encounter for other screening for malignant neoplasm of breast: Secondary | ICD-10-CM

## 2015-09-25 DIAGNOSIS — I10 Essential (primary) hypertension: Secondary | ICD-10-CM

## 2015-09-25 DIAGNOSIS — Z8673 Personal history of transient ischemic attack (TIA), and cerebral infarction without residual deficits: Secondary | ICD-10-CM

## 2015-09-25 NOTE — Progress Notes (Signed)
LOCATION: [facility]   Code Status: Full Code  Goals of care: Advanced Directive information Advanced Directives 01/08/2015  Does patient have an advance directive? No  Would patient like information on creating an advanced directive? No - patient declined information    Extended Emergency Contact Information Primary Emergency Contact: Fenter,Lauren Address: [place], [place] Phone: [phone] Relation: Daughter   Allergies  Allergen Reactions  . Amoxicillin Itching  . Cephalexin Itching  . Penicillins Itching    Chief Complaint  Patient presents with  . Medical Management of Chronic Issues    Routine Visit     HPI:  Patient is a 60 y.o. female seen today for routine visit. She denies any concerns. She has been at her baseline. She has upcoming appointment with neurology.   Review of Systems:  Constitutional: Negative for fever, chills, diaphoresis. Energy level is good.  HENT: Negative for headache,  nasal discharge, hearing loss, sore throat, difficulty swallowing. Positive for seasonal allergies.   Eyes: Negative for blurred vision, double vision and discharge. Wears glasses. Respiratory: Negative for cough, shortness of breath and wheezing.   Cardiovascular: Negative for chest pain, palpitations, leg swelling.  Gastrointestinal: Negative for heartburn, nausea, vomiting, abdominal pain. Last bowel movement was this morning. Genitourinary: Negative for dysuria and flank pain.  Musculoskeletal: Negative for back pain, fall in the facility.  Skin: Negative for itching, rash.  Neurological: Negative for dizziness. Psychiatric/Behavioral: Negative for depression    Past Medical History  Diagnosis Date  . Uterine fibroid   . Gastritis   . Hiatal hernia   . Hemorrhoids   . Anemia   . Stroke ([place]) 12 1989  . Dyslipidemia   . Hypertension   . Seizure disorder ([place])   . Carpal tunnel syndrome   .  Depression   . Osteoporosis   . Urinary incontinence   . Iron deficiency anemia 2008    negative EGD and colonoscopy  . Diabetes mellitus   . Seizures ([place])   . Hyperlipidemia    Past Surgical History  Procedure Laterality Date  . Colonoscopy  2008  . Upper gastrointestinal endoscopy  2008  . Tracheostomy    . Breast surgery  1977  . Tubal ligation    . Cesarean section      x2  . Tracheostomy closure     Social History:   reports that she has never smoked. She has never used smokeless tobacco. She reports that she does not drink alcohol or use illicit drugs.  Family History  Problem Relation Age of Onset  . Heart disease Paternal Grandfather   . Hypertension Paternal Grandfather   . Prostate cancer Paternal Uncle   . Ovarian cancer Mother   . Diabetes Cousin   . Colon cancer Neg Hx   . Hypertension Maternal Grandmother   . Stroke Maternal Grandmother   . Diabetes Brother   . Heart attack Brother     Medications:   Medication List       This list is accurate as of: 09/25/15  4:27 PM.  Always use your most recent med list.               aspirin 325 MG tablet  Take 325 mg by mouth daily. For hypertension.     atorvastatin 20 MG tablet  Commonly known as:  LIPITOR  Take 20 mg by mouth daily.  baclofen 5 mg Tabs tablet  Commonly known as:  LIORESAL  Take 5 mg by mouth 2 (two) times daily.     ferrous sulfate 325 (65 FE) MG tablet  Take 325 mg by mouth 2 (two) times daily.     levETIRAcetam 1000 MG tablet  Commonly known as:  KEPPRA  Take 1,000 mg by mouth 2 (two) times daily. For conversion disorder with seizures or convulsions.     lisinopril 10 MG tablet  Commonly known as:  PRINIVIL,ZESTRIL  Take 10 mg by mouth daily. Hold for SBP <110     loratadine 10 MG tablet  Commonly known as:  CLARITIN  Take 10 mg by mouth every morning. For allergies.     multivitamin with minerals Tabs tablet  Take 1 tablet by mouth daily.     senna-docusate  8.6-50 MG tablet  Commonly known as:  Senokot-S  Take 2 tablets by mouth at bedtime.     traMADol 50 MG tablet  Commonly known as:  ULTRAM  Take one tablet by mouth every 8 hours as needed for pain     Vitamin D3 1000 units Caps  Take 1,000 Units by mouth daily.     zinc oxide 11.3 % Crea cream  Commonly known as:  BALMEX  Apply 1 application topically 3 (three) times daily as needed. Cleanse buttocks and apply afterwards     zinc oxide 11.3 % Crea cream  Commonly known as:  BALMEX  Apply 1 application topically daily. Apply to buttocks after cleansing for protection        Immunizations: Immunization History  Administered Date(s) Administered  . Influenza,inj,Quad PF,36+ Mos 05/03/2014  . Influenza-Unspecified 12/26/2013  . PPD Test 02/06/2015, 07/08/2015     Physical Exam: Filed Vitals:   09/25/15 1622  BP: 182/88  Pulse: 78  Temp: 97.5 F (36.4 C)  TempSrc: Oral  Resp: 18  Height: 5\' 7"  (1.702 m)  Weight: 177 lb (80.287 kg)  SpO2: 97%   Body mass index is 27.72 kg/(m^2).  General- adult female, well built, in no acute distress Head- normocephalic, atraumatic Throat- moist mucus membrane Eyes- PERRLA, EOMI, no pallor, no icterus, no discharge, normal conjunctiva, normal sclera Neck- no cervical lymphadenopathy Cardiovascular- normal s1,s2, no murmur, no leg edema Respiratory- bilateral clear to auscultation, no wheeze, no rhonchi, no crackles, no use of accessory muscles Abdomen- bowel sounds present, soft, non tender Musculoskeletal- able to move her upper extremities with ROM limited in right shoulder, limited movement to LE with weakness and increased tone noted, needs assistance with transfers Neurological- alert and oriented to person, place and time Skin- warm and dry Psychiatry- normal mood and affect     Labs reviewed: Basic Metabolic Panel:  Recent Labs  11/20/14 2000 11/21/14 0016 11/22/14 0305 11/23/14 0232 11/24/14 0428 07/05/15  08/02/15  NA  --  142 140 141 141 142 142  K  --  3.8 3.8 3.5 4.0 3.7 3.6  CL  --  110 108 110 111  --   --   CO2  --  26 26 25 26   --   --   GLUCOSE  --  89 72 93 92  --   --   BUN  --  24* 21* 16 15 22* 21  CREATININE  --  0.55 0.73 0.56 0.52 0.5 0.6  CALCIUM  --  9.1 9.0 8.7* 8.8*  --   --   MG 1.4* 2.6*  --   --   --   --   --  PHOS 2.9 2.5  --   --   --   --   --    Liver Function Tests:  Recent Labs  11/22/14 0305 07/05/15  AST 36 16  ALT 70* 18  ALKPHOS 71 91  BILITOT 0.9  --   PROT 5.6*  --   ALBUMIN 2.9*  --    No results for input(s): LIPASE, AMYLASE in the last 8760 hours. No results for input(s): AMMONIA in the last 8760 hours. CBC:  Recent Labs  11/20/14 1753 11/21/14 0016 11/22/14 0305 11/24/14 0428 07/05/15 08/02/15  WBC 6.8 8.5 6.5 4.5 5.7 6.8  NEUTROABS 5.0  --  4.7  --   --   --   HGB 10.9* 10.1* 9.5* 8.4* 10.8* 11.7*  HCT 32.6* 29.7* 27.8* 25.5* 33* 36  MCV 95.6 94.3 93.3 95.9  --   --   PLT PLATELET CLUMPS NOTED ON SMEAR, COUNT APPEARS ADEQUATE PLATELET CLUMPS NOTED ON SMEAR, COUNT APPEARS DECREASED PLATELET CLUMPS NOTED ON SMEAR, UNABLE TO ESTIMATE PLATELET CLUMPING, SUGGEST RECOLLECTION OF SAMPLE IN CITRATE TUBE.  --  0*   Lab Results  Component Value Date   TSH 2.99 08/08/2015   Lipid Panel     Component Value Date/Time   CHOL 153 07/05/2015   TRIG 57 07/05/2015   HDL 50 07/05/2015   CHOLHDL 2.7 03/24/2010 0519   VLDL 13 03/24/2010 0519   LDLCALC 91 07/05/2015   Lab Results  Component Value Date   HGBA1C 5.8 08/08/2015      Assessment/Plan  CVA with paraparesis Continue aspirin 325 mg daily and lipitor 20 mg daily. Continue tramadol 50 mg q8h prn pain. Continue baclofen  HTN Elevated bp reading on review with systolic BP mostly > 0000000. Currently on lisinopril 10 mg daily. Change this to 20 mg daily and check bp bid x 2 weeks and readjust medication if needed  DM Reviewed a1c. Diet controlled at present. Monitor clinically.  Continue aspirin, statin and ACEI  Breast cancer screen Get mammogram scheduled.   Vitamin d def With low vit d on review of 19.38. Will have her on vitamin d 50,000 iu weekly for now x 12 weeks, then vitamin d 2000 iu daily.   Family/ staff Communication: reviewed care plan with patient and nursing supervisor    Blanchie Serve, MD Internal Medicine [place], [place] [address] Cell Phone (Monday-Friday 8 am - 5 pm): [phone] On Call: [phone] and follow prompts after 5 pm and on weekends Office Phone: [phone] Office Fax: [phone]

## 2015-10-02 ENCOUNTER — Ambulatory Visit (INDEPENDENT_AMBULATORY_CARE_PROVIDER_SITE_OTHER): Payer: Medicare Other | Admitting: Neurology

## 2015-10-02 ENCOUNTER — Encounter: Payer: Self-pay | Admitting: Neurology

## 2015-10-02 VITALS — BP 178/90 | HR 82 | Resp 16

## 2015-10-02 DIAGNOSIS — R269 Unspecified abnormalities of gait and mobility: Secondary | ICD-10-CM | POA: Insufficient documentation

## 2015-10-02 DIAGNOSIS — R569 Unspecified convulsions: Secondary | ICD-10-CM

## 2015-10-02 DIAGNOSIS — I69359 Hemiplegia and hemiparesis following cerebral infarction affecting unspecified side: Secondary | ICD-10-CM

## 2015-10-02 DIAGNOSIS — G8389 Other specified paralytic syndromes: Secondary | ICD-10-CM | POA: Diagnosis not present

## 2015-10-02 DIAGNOSIS — IMO0002 Reserved for concepts with insufficient information to code with codable children: Secondary | ICD-10-CM

## 2015-10-02 HISTORY — DX: Reserved for concepts with insufficient information to code with codable children: IMO0002

## 2015-10-02 HISTORY — DX: Hemiplegia and hemiparesis following cerebral infarction affecting unspecified side: I69.359

## 2015-10-02 HISTORY — DX: Unspecified abnormalities of gait and mobility: R26.9

## 2015-10-02 NOTE — Patient Instructions (Signed)

## 2015-10-02 NOTE — Progress Notes (Signed)
Reason for visit: Seizures  [name] [name] is an 60 y.o. female  History of present illness:  [name] [name] is a 60 year old right-handed black female with a history of a bifrontal stroke that occurred in the distant past with her with a spastic paraparesis. The patient is nonambulatory at this time, she currently is residing in an extended care facility. The patient has not been seen through this office in 2 years. The patient was admitted to the hospital around 11/20/2014 after a significant seizure event. The patient was transferred to an extended care facility at that point. The patient has been residing in the extended care facility and she has done well. The Keppra dose remains at 1000 mg twice daily. She is tolerating this well. She has significant spasticity in both legs, she is on very low-dose baclofen for this taking 5 mg twice daily. The patient reports some arthritis pain in the left thumb, she also has significant restriction of movement of the right shoulder with difficulty with abduction of that arm. The patient denies any headache, or new numbness or weakness of the face, arms, or legs. The patient reports some mild memory changes. She believes that she has not had another seizure since July 2016. She does not operate a motor vehicle. She returns to this office for an evaluation.  Past Medical History  Diagnosis Date  . Uterine fibroid   . Gastritis   . Hiatal hernia   . Hemorrhoids   . Anemia   . Stroke ([place]) 12 1989  . Dyslipidemia   . Hypertension   . Seizure disorder ([place])   . Carpal tunnel syndrome   . Depression   . Osteoporosis   . Urinary incontinence   . Iron deficiency anemia 2008    negative EGD and colonoscopy  . Diabetes mellitus   . Seizures ([place])   . Hyperlipidemia   . Hemiplegia as late effect of stroke ([place]) 10/02/2015  . Abnormality of gait 10/02/2015  . 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  Colonoscopy  2008  . Upper gastrointestinal endoscopy  2008  . Tracheostomy    . Breast surgery  1977  . Tubal ligation    . Cesarean section      x2  . Tracheostomy closure      Family History  Problem Relation Age of Onset  . Heart disease Paternal Grandfather   . Hypertension Paternal Grandfather   . Prostate cancer Paternal Uncle   . Ovarian cancer Mother   . Diabetes Cousin   . Colon cancer Neg Hx   . Hypertension Maternal Grandmother   . Stroke Maternal Grandmother   . Diabetes Brother   . Heart attack Brother     Social history:  reports that she has never smoked. She has never used smokeless tobacco. She reports that she does not drink alcohol or use illicit drugs.    Allergies  Allergen Reactions  . Amoxicillin Itching  . Cephalexin Itching  . Penicillins Itching    Medications:  Prior to Admission medications   Medication Sig Start Date End Date Taking? Authorizing Provider  aspirin 325 MG tablet Take 325 mg by mouth daily. For hypertension.   Yes Historical Provider, MD  atorvastatin (LIPITOR) 20 MG tablet Take 20 mg by mouth daily.   Yes Historical Provider, MD  baclofen (LIORESAL) 5 mg TABS tablet Take 5 mg by mouth 2 (two) times daily.   Yes Historical Provider,  MD  ferrous sulfate 325 (65 FE) MG tablet Take 325 mg by mouth 2 (two) times daily.   Yes Historical Provider, MD  levETIRAcetam (KEPPRA) 1000 MG tablet Take 1,000 mg by mouth 2 (two) times daily. For conversion disorder with seizures or convulsions.   Yes Historical Provider, MD  lisinopril (PRINIVIL,ZESTRIL) 5 MG tablet  04/23/15  Yes Historical Provider, MD  loratadine (CLARITIN) 10 MG tablet Take 10 mg by mouth every morning. For allergies.   Yes Historical Provider, MD  Multiple Vitamin (MULTIVITAMIN WITH MINERALS) TABS tablet Take 1 tablet by mouth daily. 02/23/14  Yes Hosie Poisson, MD  zinc oxide (BALMEX) 11.3 % CREA cream Apply 1 application topically 3 (three) times daily as needed. Cleanse  buttocks and apply afterwards   Yes Historical Provider, MD    ROS:  Out of a complete 14 system review of symptoms, the patient complains only of the following symptoms, and all other reviewed systems are negative.  Fatigue Cold intolerance Incontinence of the bladder [place]  Blood pressure 178/90, pulse 82, resp. rate 16, last menstrual period 06/24/2013.  Physical Exam  General: The patient is alert and cooperative at the time of the examination.  Respiratory: Lung fields are clear.  Cardiovascular: Regular rate and rhythm, no murmurs noted.  Neck: Neck is supple, no carotid bruits are noted.  Skin: 2+ edema below the knees is noted..   Neurologic Exam  Mental status: The patient is alert and oriented x 3 at the time of the examination. The patient has apparent normal recent and remote memory, with an apparently normal attention span and concentration ability.   Cranial nerves: Facial symmetry is present. Speech is normal, no aphasia or dysarthria is noted. Extraocular movements are full. Visual fields are full. Pupils are equal, round, and reactive to light. Discs are flat bilaterally.  Motor: The patient has good strength in the upper extremities. The patient has minimal voluntary movement of the lower extremities bilaterally. Motor tone in the lower extremities is significantly elevated. The patient has incomplete abduction of the right arm secondary to restriction of movement across the right shoulder.  Sensory examination: Soft touch sensation is symmetric on the face, arms, and legs. Pinprick sensation is symmetric on the face, arms, and legs.  Coordination: The patient has good finger-nose-finger bilaterally. The patient is unable to perform heel-to-shin on either side.  Gait and station: The patient is wheelchair-bound, she could not be ambulated.  Reflexes: Deep tendon reflexes are symmetric, there is an upgoing toe on the right, neutral on the left.   CT head  11/20/14:  IMPRESSION: No evidence of acute intracranial abnormality.  Severe bifrontal encephalomalacia.  * CT scan images were reviewed online. I agree with the written report.     Assessment/Plan:  1. Stroke affecting the frontal hemispheres bilaterally  2. Seizures secondary to stroke  3. 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 Nonambulatory state  The patient has significant spasticity in both lower extremities, the baclofen dose will be increased to 5 mg 3 times daily, and this can be increased slowly over time. The patient has done well over the last year on the Keppra dose of 1000 mg twice daily, this dose will be maintained. The patient has Ultram listed as PRN medication, this will be discontinued as it is contraindicated in individuals with seizures. The patient will follow-up in 6 months, sooner if needed.  Jill Alexanders MD 10/02/2015 6:35 PM  [facility] [address]  [place], [place] [address]  Phone [phone] Fax [phone]

## 2015-11-05 ENCOUNTER — Encounter: Payer: Self-pay | Admitting: Internal Medicine

## 2015-11-05 ENCOUNTER — Non-Acute Institutional Stay (SKILLED_NURSING_FACILITY): Payer: Medicare Other | Admitting: Internal Medicine

## 2015-11-05 DIAGNOSIS — K5900 Constipation, unspecified: Secondary | ICD-10-CM | POA: Diagnosis not present

## 2015-11-05 DIAGNOSIS — R569 Unspecified convulsions: Secondary | ICD-10-CM | POA: Diagnosis not present

## 2015-11-05 DIAGNOSIS — Z124 Encounter for screening for malignant neoplasm of cervix: Secondary | ICD-10-CM

## 2015-11-05 DIAGNOSIS — E1149 Type 2 diabetes mellitus with other diabetic neurological complication: Secondary | ICD-10-CM

## 2015-11-05 DIAGNOSIS — K5909 Other constipation: Secondary | ICD-10-CM

## 2015-11-05 NOTE — Progress Notes (Signed)
LOCATION: [facility]   Code Status: Full Code  Goals of care: Advanced Directive information Advanced Directives 01/08/2015  Does patient have an advance directive? No  Would patient like information on creating an advanced directive? No - patient declined information    Extended Emergency Contact Information Primary Emergency Contact: Pompei,Lauren Address: [place], [place] Phone: [phone] Relation: Daughter   Allergies  Allergen Reactions  . Amoxicillin Itching  . Cephalexin Itching  . Penicillins Itching    Chief Complaint  Patient presents with  . Medical Management of Chronic Issues    Routine Visit     HPI:  Patient is a 60 y.o. female seen today for routine visit. She denies any concerns. She has been at her baseline. She would like to see Gyn for pelvic exam and pap smear   Review of Systems:  Constitutional: Negative for fever. Energy level is good.  HENT: Negative for headache,  nasal discharge, hearing loss, sore throat, difficulty swallowing. Positive for seasonal allergies.   Eyes: Negative for blurred vision, double vision and discharge. Wears glasses. Respiratory: Negative for cough, shortness of breath and wheezing.   Cardiovascular: Negative for chest pain, palpitations, leg swelling.  Gastrointestinal: Negative for heartburn, nausea, vomiting, abdominal pain. Last bowel movement was this morning with some straining. Genitourinary: Negative for dysuria and flank pain.  Musculoskeletal: Negative for back pain, fall in the facility.  Skin: Negative for itching, rash.  Neurological: Negative for dizziness. Psychiatric/Behavioral: Negative for depression    Past Medical History  Diagnosis Date  . Uterine fibroid   . Gastritis   . Hiatal hernia   . Hemorrhoids   . Anemia   . Stroke ([place]) 12 1989  . Dyslipidemia   . Hypertension   . Seizure disorder ([place])   . Carpal tunnel syndrome     . Depression   . Osteoporosis   . Urinary incontinence   . Iron deficiency anemia 2008    negative EGD and colonoscopy  . Diabetes mellitus   . Seizures ([place])   . Hyperlipidemia   . Hemiplegia as late effect of stroke ([place]) 10/02/2015  . Abnormality of gait 10/02/2015  . 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 Colonoscopy  2008  . Upper gastrointestinal endoscopy  2008  . Tracheostomy    . Breast surgery  1977  . Tubal ligation    . Cesarean section      x2  . Tracheostomy closure     Social History:   reports that she has never smoked. She has never used smokeless tobacco. She reports that she does not drink alcohol or use illicit drugs.  Family History  Problem Relation Age of Onset  . Heart disease Paternal Grandfather   . Hypertension Paternal Grandfather   . Prostate cancer Paternal Uncle   . Ovarian cancer Mother   . Diabetes Cousin   . Colon cancer Neg Hx   . Hypertension Maternal Grandmother   . Stroke Maternal Grandmother   . Diabetes Brother   . Heart attack Brother     Medications:   Medication List       This list is accurate as of: 11/05/15  2:30 PM.  Always use your most recent med list.               aspirin 325 MG tablet  Take 325 mg  by mouth daily. For hypertension.     atorvastatin 20 MG tablet  Commonly known as:  LIPITOR  Take 20 mg by mouth daily.     baclofen 5 mg Tabs tablet  Commonly known as:  LIORESAL  Take 5 mg by mouth 3 (three) times daily.     ferrous sulfate 325 (65 FE) MG tablet  Take 325 mg by mouth 2 (two) times daily.     levETIRAcetam 1000 MG tablet  Commonly known as:  KEPPRA  Take 1,000 mg by mouth 2 (two) times daily. For conversion disorder with seizures or convulsions.     lisinopril 30 MG tablet  Commonly known as:  PRINIVIL,ZESTRIL  Take 30 mg by mouth daily.     loratadine 10 MG tablet  Commonly known as:  CLARITIN  Take 10 mg by mouth every morning. For  allergies.     multivitamin with minerals Tabs tablet  Take 1 tablet by mouth daily.     promethazine 25 MG tablet  Commonly known as:  PHENERGAN  Take 25 mg by mouth every 6 (six) hours as needed for nausea or vomiting.     senna 8.6 MG tablet  Commonly known as:  SENOKOT  Take 1 tablet by mouth daily.     Vitamin D3 50000 units Tabs  Take 1 tablet by mouth once a week.     Vitamin D3 2000 units Tabs  Take 1 tablet by mouth daily.     zinc oxide 11.3 % Crea cream  Commonly known as:  BALMEX  Apply 1 application topically 3 (three) times daily as needed. Cleanse buttocks and apply afterwards        Immunizations: Immunization History  Administered Date(s) Administered  . Influenza,inj,Quad PF,36+ Mos 05/03/2014  . Influenza-Unspecified 12/26/2013  . PPD Test 02/06/2015, 07/08/2015     Physical Exam: Filed Vitals:   11/05/15 1417  BP: 144/81  Pulse: 80  Temp: 97.9 F (36.6 C)  TempSrc: Oral  Resp: 18  Height: 5\' 7"  (1.702 m)  Weight: 177 lb (80.287 kg)  SpO2: 98%   Body mass index is 27.72 kg/(m^2).  General- adult female, well built, in no acute distress Head- normocephalic, atraumatic Throat- moist mucus membrane Eyes- PERRLA, EOMI, no pallor, no icterus Neck- no cervical lymphadenopathy Cardiovascular- normal s1,s2, no murmur, no leg edema Respiratory- bilateral clear to auscultation, no wheeze, no rhonchi, no crackles, no use of accessory muscles Abdomen- bowel sounds present, soft, non tender Musculoskeletal- able to move her upper extremities with ROM limited in right shoulder, limited movement to LE with weakness and increased tone noted, needs assistance with transfers Neurological- alert and oriented to person, place and time Skin- warm and dry Psychiatry- normal mood and affect     Labs reviewed: Basic Metabolic Panel:  Recent Labs  11/20/14 2000 11/21/14 0016 11/22/14 0305 11/23/14 0232 11/24/14 0428 07/05/15 08/02/15 09/20/15  NA   --  142 140 141 141 142 142 142  K  --  3.8 3.8 3.5 4.0 3.7 3.6 3.8  CL  --  110 108 110 111  --   --   --   CO2  --  26 26 25 26   --   --   --   GLUCOSE  --  89 72 93 92  --   --   --   BUN  --  24* 21* 16 15 22* 21 20  CREATININE  --  0.55 0.73 0.56 0.52 0.5 0.6 0.6  CALCIUM  --  9.1 9.0 8.7* 8.8*  --   --   --   MG 1.4* 2.6*  --   --   --   --   --   --   PHOS 2.9 2.5  --   --   --   --   --   --    Liver Function Tests:  Recent Labs  11/22/14 0305 07/05/15 09/20/15  AST 36 16 13  ALT 70* 18 15  ALKPHOS 71 91 109  BILITOT 0.9  --   --   PROT 5.6*  --   --   ALBUMIN 2.9*  --   --    No results for input(s): LIPASE, AMYLASE in the last 8760 hours. No results for input(s): AMMONIA in the last 8760 hours. CBC:  Recent Labs  11/20/14 1753 11/21/14 0016 11/22/14 0305 11/24/14 0428 07/05/15 08/02/15 09/20/15  WBC 6.8 8.5 6.5 4.5 5.7 6.8 6.4  NEUTROABS 5.0  --  4.7  --   --   --   --   HGB 10.9* 10.1* 9.5* 8.4* 10.8* 11.7* 11.4*  HCT 32.6* 29.7* 27.8* 25.5* 33* 36 34*  MCV 95.6 94.3 93.3 95.9  --   --   --   PLT PLATELET CLUMPS NOTED ON SMEAR, COUNT APPEARS ADEQUATE PLATELET CLUMPS NOTED ON SMEAR, COUNT APPEARS DECREASED PLATELET CLUMPS NOTED ON SMEAR, UNABLE TO ESTIMATE PLATELET CLUMPING, SUGGEST RECOLLECTION OF SAMPLE IN CITRATE TUBE.  --  0*  --    Lab Results  Component Value Date   TSH 2.37 09/20/2015   Lipid Panel     Component Value Date/Time   CHOL 159 09/20/2015   TRIG 118 09/20/2015   HDL 40 09/20/2015   CHOLHDL 2.7 03/24/2010 0519   VLDL 13 03/24/2010 0519   LDLCALC 95 09/20/2015   Lab Results  Component Value Date   HGBA1C 5.8 09/20/2015      Assessment/Plan  Constipation On iron supplement. Currently on senokot 1 tab daily. D/c this and start senokot s 2 tab qhs and miralax daily as needed and monitor. Hydration encouraged  Cervical cancer screen She would prefer seeing a Gyn. Get gyn referral  DM Reviewed a1c. Diet controlled at present.  Monitor clinically. Continue aspirin, statin and ACEI. Order for diabetic eye exam  Seizure disorder No new seizure episode. Continue keppra bid for now    [facility], MD Internal Medicine [address] [place], [place] [address] Cell Phone (Monday-Friday 8 am - 5 pm): [phone] On Call: [phone] and follow prompts after 5 pm and on weekends Office Phone: [phone] Office Fax: [phone]

## 2015-12-06 ENCOUNTER — Non-Acute Institutional Stay (SKILLED_NURSING_FACILITY): Payer: Medicare Other | Admitting: Internal Medicine

## 2015-12-06 ENCOUNTER — Encounter: Payer: Self-pay | Admitting: Internal Medicine

## 2015-12-06 DIAGNOSIS — F445 Conversion disorder with seizures or convulsions: Secondary | ICD-10-CM | POA: Insufficient documentation

## 2015-12-06 DIAGNOSIS — I10 Essential (primary) hypertension: Secondary | ICD-10-CM | POA: Diagnosis not present

## 2015-12-06 DIAGNOSIS — M1710 Unilateral primary osteoarthritis, unspecified knee: Secondary | ICD-10-CM | POA: Insufficient documentation

## 2015-12-06 DIAGNOSIS — M179 Osteoarthritis of knee, unspecified: Secondary | ICD-10-CM

## 2015-12-06 DIAGNOSIS — E785 Hyperlipidemia, unspecified: Secondary | ICD-10-CM

## 2015-12-06 NOTE — Progress Notes (Signed)
LOCATION: [facility]   Code Status: Full Code  Goals of care: Advanced Directive information Advanced Directives 01/08/2015  Does patient have an advance directive? No  Would patient like information on creating an advanced directive? No - patient declined information  Pre-existing out of facility DNR order (yellow form or pink MOST form) -    Extended Emergency Contact Information Primary Emergency Contact: Kluesner,Lauren Address: [place], [place] Phone: [phone] Relation: Daughter   Allergies  Allergen Reactions  . Amoxicillin Itching  . Cephalexin Itching  . Penicillins Itching    Chief Complaint  Patient presents with  . Medical Management of Chronic Issues    Routine Visit     HPI:  Patient is a 60 y.o. female seen today for routine visit. She denies any concerns. She has been at her baseline. She has been working with restorative therapy team.    Review of Systems:  Constitutional: Negative for fever. Energy level is good.  HENT: Negative for headache, sore throat, difficulty swallowing. Positive for seasonal allergies.   Eyes: Negative for blurred vision, double vision and discharge. Wears glasses. Respiratory: Negative for cough, shortness of breath and wheezing.   Cardiovascular: Negative for chest pain, palpitations, leg swelling.  Gastrointestinal: Negative for heartburn, nausea, vomiting, abdominal pain. Genitourinary: Negative for dysuria and flank pain.  Musculoskeletal: Negative for back pain, fall in the facility.  Skin: Negative for itching, rash.  Neurological: Negative for dizziness. Psychiatric/Behavioral: Negative for depression    Past Medical History:  Diagnosis Date  . Abnormality of gait 10/02/2015  . Anemia   . Carpal tunnel syndrome   . Depression   . Diabetes mellitus   . Dyslipidemia   . Gastritis   . Hemiplegia as late effect of stroke ([place]) 10/02/2015  . Hemorrhoids     . Hiatal hernia   . Hyperlipidemia   . Hypertension   . Iron deficiency anemia 2008   negative EGD and colonoscopy  . Osteoporosis   . Seizure disorder ([place])   . Seizures ([place])   . 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 Stroke ([place]) 12 1989  . Urinary incontinence   . Uterine fibroid    Past Surgical History:  Procedure Laterality Date  . BREAST SURGERY  1977  . CESAREAN SECTION     x2  . COLONOSCOPY  2008  . TRACHEOSTOMY    . TRACHEOSTOMY CLOSURE    . TUBAL LIGATION    . UPPER GASTROINTESTINAL ENDOSCOPY  2008     Medications:   Medication List       Accurate as of 12/06/15  2:14 PM. Always use your most recent med list.          aspirin 325 MG tablet Take 325 mg by mouth daily. For hypertension.   atenolol 50 MG tablet Commonly known as:  TENORMIN Take 50 mg by mouth daily.   atorvastatin 20 MG tablet Commonly known as:  LIPITOR Take 20 mg by mouth daily.   baclofen 5 mg Tabs tablet Commonly known as:  LIORESAL Take 5 mg by mouth 3 (three) times daily.   BIOFREEZE 4 % Gel Generic drug:  Menthol (Topical Analgesic) Apply 1 application topically 2 (two) times daily as needed (Apply to knees).   ferrous sulfate 325 (65 FE) MG tablet Take 325 mg by mouth 2 (two) times daily.   levETIRAcetam 1000 MG tablet Commonly known as:  KEPPRA  Take 1,000 mg by mouth 2 (two) times daily. For conversion disorder with seizures or convulsions.   lisinopril 30 MG tablet Commonly known as:  PRINIVIL,ZESTRIL Take 30 mg by mouth daily.   loratadine 10 MG tablet Commonly known as:  CLARITIN Take 10 mg by mouth every morning. For allergies.   multivitamin with minerals Tabs tablet Take 1 tablet by mouth daily.   promethazine 25 MG suppository Commonly known as:  PHENERGAN Place 25 mg rectally every 12 (twelve) hours as needed for nausea or vomiting (If nausea persist mor than 24 hours notify MD/NP).   promethazine 25 MG tablet Commonly known as:  PHENERGAN Take  25 mg by mouth every 6 (six) hours as needed for nausea or vomiting.   senna 8.6 MG tablet Commonly known as:  SENOKOT Take 2 tablets by mouth at bedtime.   Vitamin D3 50000 units Tabs Take 1 tablet by mouth once a week. Stop date 12/18/15   Vitamin D3 2000 units Tabs Take 1 tablet by mouth daily.   zinc oxide 11.3 % Crea cream Commonly known as:  BALMEX Apply 1 application topically 3 (three) times daily as needed. Cleanse buttocks and apply afterwards       Immunizations: Immunization History  Administered Date(s) Administered  . Influenza,inj,Quad PF,36+ Mos 05/03/2014  . Influenza-Unspecified 12/26/2013  . PPD Test 02/06/2015, 07/08/2015     Physical Exam: Vitals:   12/06/15 1404  BP: (!) 167/94  Pulse: 80  Resp: 16  Temp: 98.4 F (36.9 C)  TempSrc: Oral  SpO2: 96%  Weight: 184 lb 9.6 oz (83.7 kg)  Height: 5\' 7"  (1.702 m)   Body mass index is 28.91 kg/m.  General- adult female, well built, in no acute distress Head- normocephalic, atraumatic Throat- moist mucus membrane Eyes- PERRLA, EOMI, no pallor, no icterus Neck- no cervical lymphadenopathy Cardiovascular- normal s1,s2, no murmur, no leg edema Respiratory- bilateral clear to auscultation, no wheeze, no rhonchi, no crackles, no use of accessory muscles Abdomen- bowel sounds present, soft, non tender Musculoskeletal- able to move her upper extremities with ROM limited in right shoulder, no active ROM to her LE, weakness to both her legs present Neurological- alert and oriented to person, place and time Skin- warm and dry Psychiatry- normal mood and affect     Labs reviewed: Basic Metabolic Panel:  Recent Labs  07/05/15 08/02/15 09/20/15  NA 142 142 142  K 3.7 3.6 3.8  BUN 22* 21 20  CREATININE 0.5 0.6 0.6   Liver Function Tests:  Recent Labs  07/05/15 09/20/15  AST 16 13  ALT 18 15  ALKPHOS 91 109   No results for input(s): LIPASE, AMYLASE in the last 8760 hours. No results for  input(s): AMMONIA in the last 8760 hours. CBC:  Recent Labs  07/05/15 08/02/15 09/20/15  WBC 5.7 6.8 6.4  HGB 10.8* 11.7* 11.4*  HCT 33* 36 34*  PLT  --  0*  --    Lab Results  Component Value Date   TSH 2.37 09/20/2015   Lipid Panel     Component Value Date/Time   CHOL 159 09/20/2015   TRIG 118 09/20/2015   HDL 40 09/20/2015   CHOLHDL 2.7 03/24/2010 0519   VLDL 13 03/24/2010 0519   LDLCALC 95 09/20/2015   Lab Results  Component Value Date   HGBA1C 5.8 09/20/2015      Assessment/Plan  Hypertension Few elevated bp reading on chart review. Currently on atenolol 50 mg daily with lisinopril 30 mg daily.  Increase lisinopril to 40 mg daily and monitor bp.  Knee OA With decreased ROM. Continue biofreeze bid prn and to work with restorative team  Conversion disorder with seizure Seizure free. Continue keppra 1000 mg bid  Hyperlipidemia With hx of cva, continue lipitor 20 mg daily Lipid Panel     Component Value Date/Time   CHOL 159 09/20/2015   TRIG 118 09/20/2015   HDL 40 09/20/2015   CHOLHDL 2.7 03/24/2010 0519   VLDL 13 03/24/2010 0519   LDLCALC 95 09/20/2015      Blanchie Serve, MD Internal Medicine [facility] [address] [place], [place] [address] Cell Phone (Monday-Friday 8 am - 5 pm): [phone] On Call: [phone] and follow prompts after 5 pm and on weekends Office Phone: [phone] Office Fax: [phone]

## 2016-01-16 ENCOUNTER — Non-Acute Institutional Stay (SKILLED_NURSING_FACILITY): Payer: Medicare Other | Admitting: Internal Medicine

## 2016-01-16 ENCOUNTER — Encounter: Payer: Self-pay | Admitting: Internal Medicine

## 2016-01-16 DIAGNOSIS — M216X2 Other acquired deformities of left foot: Secondary | ICD-10-CM

## 2016-01-16 DIAGNOSIS — K5909 Other constipation: Secondary | ICD-10-CM

## 2016-01-16 DIAGNOSIS — D509 Iron deficiency anemia, unspecified: Secondary | ICD-10-CM | POA: Diagnosis not present

## 2016-01-16 DIAGNOSIS — J309 Allergic rhinitis, unspecified: Secondary | ICD-10-CM | POA: Diagnosis not present

## 2016-01-16 DIAGNOSIS — I10 Essential (primary) hypertension: Secondary | ICD-10-CM | POA: Diagnosis not present

## 2016-01-16 NOTE — Progress Notes (Signed)
LOCATION: [facility]   Code Status: Full Code  Goals of care: Advanced Directive information Advanced Directives 01/08/2015  Does patient have an advance directive? No  Would patient like information on creating an advanced directive? No - patient declined information  Pre-existing out of facility DNR order (yellow form or pink MOST form) -    Extended Emergency Contact Information Primary Emergency Contact: Schnepp,Lauren Address: [place], [place] Phone: [phone] Relation: Daughter   Allergies  Allergen Reactions  . Amoxicillin Itching  . Cephalexin Itching  . Penicillins Itching    Chief Complaint  Patient presents with  . Medical Management of Chronic Issues    Routine Visit     HPI:  Patient is a 60 y.o. female seen today for routine visit. She complaints of her right shoulder bothering her and mentions that biofreeze has been helpful with the pain. She feels her ROM has decreased on that shoulder. She also mentions noticing that her left foot is turning outward. Denies pain to her legs. She denies any other concerns. She has been at her baseline.    Review of Systems:  Constitutional: Negative for fever. Energy level is good.  HENT: Negative for headache, sore throat, difficulty swallowing. Positive for seasonal allergies.   Eyes: Negative for blurred vision, double vision and discharge. Wears glasses. Respiratory: Negative for cough, shortness of breath and wheezing.   Cardiovascular: Negative for chest pain, palpitations, leg swelling.  Gastrointestinal: Negative for heartburn, nausea, vomiting, abdominal pain. Stool softener helps with her bowel movement Genitourinary: Negative for dysuria and flank pain.  Musculoskeletal: Negative for back pain, fall in the facility.  Skin: Negative for itching, rash.  Neurological: Negative for dizziness. Psychiatric/Behavioral: Negative for depression    Past  Medical History:  Diagnosis Date  . Abnormality of gait 10/02/2015  . Anemia   . Carpal tunnel syndrome   . Depression   . Diabetes mellitus   . Dyslipidemia   . Gastritis   . Hemiplegia as late effect of stroke ([place]) 10/02/2015  . Hemorrhoids   . Hiatal hernia   . Hyperlipidemia   . Hypertension   . Iron deficiency anemia 2008   negative EGD and colonoscopy  . Osteoporosis   . Seizure disorder ([place])   . Seizures ([place])   . 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 Stroke ([place]) 12 1989  . Urinary incontinence   . Uterine fibroid    Past Surgical History:  Procedure Laterality Date  . BREAST SURGERY  1977  . CESAREAN SECTION     x2  . COLONOSCOPY  2008  . TRACHEOSTOMY    . TRACHEOSTOMY CLOSURE    . TUBAL LIGATION    . UPPER GASTROINTESTINAL ENDOSCOPY  2008     Medications:   Medication List       Accurate as of 01/16/16  2:28 PM. Always use your most recent med list.          acetaminophen 325 MG tablet Commonly known as:  TYLENOL Take 650 mg by mouth every 6 (six) hours as needed.   amLODipine 5 MG tablet Commonly known as:  NORVASC Take 5 mg by mouth daily.   aspirin 325 MG tablet Take 325 mg by mouth daily. For hypertension.   atenolol 100 MG tablet Commonly known as:  TENORMIN Take 100 mg by mouth daily.   atorvastatin 20 MG tablet Commonly known as:  LIPITOR  Take 20 mg by mouth daily.   baclofen 5 mg Tabs tablet Commonly known as:  LIORESAL Take 5 mg by mouth 3 (three) times daily.   BIOFREEZE 4 % Gel Generic drug:  Menthol (Topical Analgesic) Apply 1 application topically 2 (two) times daily as needed (Apply to knees).   ferrous sulfate 325 (65 FE) MG tablet Take 325 mg by mouth 2 (two) times daily.   fluticasone 50 MCG/ACT nasal spray Commonly known as:  FLONASE Place 1 spray into both nostrils 2 (two) times daily as needed for allergies or rhinitis.   hydrochlorothiazide 12.5 MG capsule Commonly known as:  MICROZIDE Take 12.5 mg by mouth  daily.   levETIRAcetam 1000 MG tablet Commonly known as:  KEPPRA Take 1,000 mg by mouth 2 (two) times daily. For conversion disorder with seizures or convulsions.   lisinopril 40 MG tablet Commonly known as:  PRINIVIL,ZESTRIL Take 40 mg by mouth daily.   loratadine 10 MG tablet Commonly known as:  CLARITIN Take 10 mg by mouth every morning. For allergies.   multivitamin with minerals Tabs tablet Take 1 tablet by mouth daily.   polyethylene glycol packet Commonly known as:  MIRALAX / GLYCOLAX Take 17 g by mouth daily as needed.   promethazine 25 MG suppository Commonly known as:  PHENERGAN Place 25 mg rectally every 12 (twelve) hours as needed for nausea or vomiting (If nausea persist mor than 24 hours notify MD/NP).   promethazine 25 MG tablet Commonly known as:  PHENERGAN Take 25 mg by mouth every 6 (six) hours as needed for nausea or vomiting.   senna 8.6 MG tablet Commonly known as:  SENOKOT Take 2 tablets by mouth at bedtime.   Vitamin D3 2000 units Tabs Take 1 tablet by mouth daily.   zinc oxide 11.3 % Crea cream Commonly known as:  BALMEX Apply 1 application topically 3 (three) times daily as needed. Cleanse buttocks and apply afterwards       Immunizations: Immunization History  Administered Date(s) Administered  . Influenza,inj,Quad PF,36+ Mos 05/03/2014  . Influenza-Unspecified 12/26/2013  . PPD Test 02/06/2015, 07/08/2015     Physical Exam: Vitals:   01/16/16 1419  BP: (!) 151/87  Pulse: 63  Resp: 16  Temp: 97.1 F (36.2 C)  TempSrc: Oral  SpO2: 97%  Weight: 184 lb 9.6 oz (83.7 kg)  Height: 5\' 7"  (1.702 m)   Body mass index is 28.91 kg/m.  General- adult female, overweight, in no acute distress Head- normocephalic, atraumatic Throat- moist mucus membrane Eyes- PERRLA, EOMI, no pallor, no icterus Neck- no cervical lymphadenopathy Cardiovascular- normal s1,s2, no murmur, no leg edema Respiratory- bilateral clear to auscultation, no  wheeze, no rhonchi, no crackles, no use of accessory muscles Abdomen- bowel sounds present, soft, non tender Musculoskeletal- able to move her upper extremities with ROM limited in right shoulder, no active ROM to her LE, weakness to both her legs present, left foot externally rotated with some eversion noted, non tender Neurological- alert and oriented to person, place and time Skin- warm and dry Psychiatry- normal mood and affect     Labs reviewed: Basic Metabolic Panel:  Recent Labs  07/05/15 08/02/15 09/20/15  NA 142 142 142  K 3.7 3.6 3.8  BUN 22* 21 20  CREATININE 0.5 0.6 0.6   Liver Function Tests:  Recent Labs  07/05/15 09/20/15  AST 16 13  ALT 18 15  ALKPHOS 91 109   No results for input(s): LIPASE, AMYLASE in the last 8760 hours.  No results for input(s): AMMONIA in the last 8760 hours. CBC:  Recent Labs  07/05/15 08/02/15 09/20/15  WBC 5.7 6.8 6.4  HGB 10.8* 11.7* 11.4*  HCT 33* 36 34*  PLT  --  0*  --    Lab Results  Component Value Date   TSH 2.37 09/20/2015   Lipid Panel     Component Value Date/Time   CHOL 159 09/20/2015   TRIG 118 09/20/2015   HDL 40 09/20/2015   CHOLHDL 2.7 03/24/2010 0519   VLDL 13 03/24/2010 0519   LDLCALC 95 09/20/2015   Lab Results  Component Value Date   HGBA1C 5.8 09/20/2015      Assessment/Plan  Iron def anemia Chronic, denies new fatigue or bleed. Continue feso4 325 mg bid and monitor cbc  Allergic rhinitis Continue her claritin with prn flonase and monitor  Hypertension Few elevated bp reading on chart review. Currently started on norvasc 5 mg daily, hctz 12.5 mg daily. continue atenolol 100 mg daily, lisinopril 40 mg daily. monitor bp readings  Constipation Continue senokot 2 tab qhs and change miralax to daily for now and monitor  Left foot rotation Non tender, has bilateral lower extremity weakness. Mentions wearing brace to her left leg before. Will have PT/OT evaluate for need of brace to help  with deformity   Blanchie Serve, MD Internal Medicine [place], [place] [address] Cell Phone (Monday-Friday 8 am - 5 pm): [phone] On Call: [phone] and follow prompts after 5 pm and on weekends Office Phone: [phone] Office Fax: [phone]

## 2016-03-10 ENCOUNTER — Ambulatory Visit (INDEPENDENT_AMBULATORY_CARE_PROVIDER_SITE_OTHER): Payer: Medicare Other

## 2016-03-10 ENCOUNTER — Ambulatory Visit (INDEPENDENT_AMBULATORY_CARE_PROVIDER_SITE_OTHER): Payer: Medicare Other | Admitting: Orthopaedic Surgery

## 2016-03-10 DIAGNOSIS — M25561 Pain in right knee: Secondary | ICD-10-CM

## 2016-03-10 DIAGNOSIS — G8929 Other chronic pain: Secondary | ICD-10-CM | POA: Diagnosis not present

## 2016-03-10 DIAGNOSIS — M25511 Pain in right shoulder: Secondary | ICD-10-CM | POA: Diagnosis not present

## 2016-03-10 NOTE — Progress Notes (Signed)
Office Visit Note   Patient: [name] [name]           Date of Birth: Dec 13, 1955           MRN: [id number] Visit Date: 03/10/2016              Requested by: Blanchie Serve, MD [address] [place], [place] [address] PCP: Blanchie Serve, MD   Assessment & Plan: Visit Diagnoses:  1. Chronic pain of right knee   2. Chronic right shoulder pain     Plan: X-rays of the shoulder and knee were reviewed with the patient and the conditions were discussed. Patient has chronic rotator cuff arthropathy and not a candidate for repair and she is not really a good candidate for a reverse shoulder replacement. I would recommend continued physical therapy. For the knees I think her pain comes from disuse and deconditioning and osteoarthritis. A right knee injection was performed today under sterile conditions patient tolerated this well. I will see her back as needed  Follow-Up Instructions: Return if symptoms worsen or fail to improve.   Orders:  Orders Placed This Encounter  Procedures  . XR KNEE 3 VIEW RIGHT  . XR Shoulder Right   No orders of the defined types were placed in this encounter.     Procedures: Large Joint Inj Date/Time: 03/10/2016 8:37 PM Performed by: Leandrew Koyanagi Authorized by: Leandrew Koyanagi   Consent Given by:  Patient Timeout: prior to procedure the correct patient, procedure, and site was verified   Indications:  Pain Location:  Knee Site:  R knee Prep: patient was prepped and draped in usual sterile fashion   Needle Size:  22 G Approach:  Anterolateral Ultrasound Guidance: No   Fluoroscopic Guidance: No       Clinical Data: No additional findings.   Subjective: Chief Complaint  Patient presents with  . Right Knee - Pain  . Right Shoulder - Pain    HPI [name] [name] is a 60 year old female who lives permanently at a skilled nursing facility comes in with right shoulder and right knee pain. She has lived at [facility] since last year when she had a  severe seizure and she has been nonambulatory since then. She reports that she has not been able to move her right shoulder since then but after looking back at her records she is had issues with her right shoulder for even longer than that for chronic rotator cuff deficient shoulder and secondary arthropathy. She states that her right knee has constant 4 out of 10 pain with touch and essentially has been nonambulatory for at least 6 months. She denies any injuries. She denies any swelling. The pain does not radiate. Review of Systems Negative except for history of present illness  Objective: Vital Signs: LMP 06/24/2013   Physical Exam Well-developed well-nourished distress alert and oriented 3 nonlabored breathing normal judgments affect.  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 She has strong distal pulses. Exam of the right knee shows glossy skin. There is no effusion. Range of motion is supple. Collaterals and cruciates are normal. Specialty Comments:  No specialty comments available.  Imaging: Xr Knee 3 View Right  Result Date: 03/10/2016 Generalized osteopenia, osteoarthritis and DJD.  Xr Shoulder Right  Result Date: 03/10/2016 Rotator cuff arthropathy with high riding humeral head.  Generalize osteopenia    PMFS History: Patient Active Problem List   Diagnosis Date Noted  . Conversion disorder with attacks or  seizures 12/06/2015  . Localized osteoarthritis of knee 12/06/2015  . Hemiplegia as late effect of stroke ([place]) 10/02/2015  . Abnormality of gait 10/02/2015  . 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 Vitamin D deficiency 08/09/2015  . Seasonal allergies 06/16/2015  . Seizures ([place]) 05/18/2015  . Type II diabetes mellitus with neurological manifestations ([place]) 02/07/2015  . Essential hypertension, benign 02/07/2015  . Status epilepticus ([place])   . Encounter for orogastric (OG) tube placement   . Septic shock ([place]) 05/02/2014  .  Normocytic anemia 05/01/2014  . Protein-calorie malnutrition, severe ([place]) 02/23/2014  . Hyperlipidemia LDL goal <70 02/22/2014  . History of stroke 06/09/2007   Past Medical History:  Diagnosis Date  . Abnormality of gait 10/02/2015  . Anemia   . Carpal tunnel syndrome   . Depression   . Diabetes mellitus   . Dyslipidemia   . Gastritis   . Hemiplegia as late effect of stroke ([place]) 10/02/2015  . Hemorrhoids   . Hiatal hernia   . Hyperlipidemia   . Hypertension   . Iron deficiency anemia 2008   negative EGD and colonoscopy  . Osteoporosis   . Seizure disorder ([place])   . Seizures ([place])   . 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 Stroke ([place]) 12 1989  . Urinary incontinence   . Uterine fibroid     Family History  Problem Relation Age of Onset  . Heart disease Paternal Grandfather   . Hypertension Paternal Grandfather   . Prostate cancer Paternal Uncle   . Ovarian cancer Mother   . Hypertension Maternal Grandmother   . Stroke Maternal Grandmother   . Diabetes Brother   . Heart attack Brother   . Diabetes Cousin   . Colon cancer Neg Hx     Past Surgical History:  Procedure Laterality Date  . BREAST SURGERY  1977  . CESAREAN SECTION     x2  . COLONOSCOPY  2008  . TRACHEOSTOMY    . TRACHEOSTOMY CLOSURE    . TUBAL LIGATION    . UPPER GASTROINTESTINAL ENDOSCOPY  2008   Social History   Occupational History  . Disabled    Social History Main Topics  . Smoking status: Never Smoker  . Smokeless tobacco: Never Used  . Alcohol use No  . Drug use: No  . Sexual activity: Not on file

## 2016-03-13 ENCOUNTER — Non-Acute Institutional Stay (SKILLED_NURSING_FACILITY): Payer: Medicare Other | Admitting: Internal Medicine

## 2016-03-13 ENCOUNTER — Encounter: Payer: Self-pay | Admitting: Internal Medicine

## 2016-03-13 DIAGNOSIS — K5909 Other constipation: Secondary | ICD-10-CM | POA: Diagnosis not present

## 2016-03-13 DIAGNOSIS — G8220 Paraplegia, unspecified: Secondary | ICD-10-CM | POA: Diagnosis not present

## 2016-03-13 DIAGNOSIS — R569 Unspecified convulsions: Secondary | ICD-10-CM | POA: Diagnosis not present

## 2016-03-13 NOTE — Progress Notes (Signed)
LOCATION: [facility]   Code Status: Full Code  Goals of care: Advanced Directive information Advanced Directives 01/08/2015  Does patient have an advance directive? No  Would patient like information on creating an advanced directive? No - patient declined information  Pre-existing out of facility DNR order (yellow form or pink MOST form) -    Extended Emergency Contact Information Primary Emergency Contact: Carrillo,Lauren Address: [place], [place] Phone: [phone] Relation: Daughter   Allergies  Allergen Reactions  . Amoxicillin Itching  . Cephalexin Itching  . Penicillins Itching    Chief Complaint  Patient presents with  . Medical Management of Chronic Issues    Routine Visit     HPI:  Patient is a 60 y.o. female seen today for routine visit. She has been at her baseline. She gets out of bed daily. No fall has been reported. No pressure sore reported. No new concerns from nursing staff.   Review of Systems:  Constitutional: Negative for fever HENT: Negative for headache  Eyes: Negative for double vision and discharge. Wears glasses. Respiratory: Negative for cough, shortness of breath and wheezing.   Cardiovascular: Negative for chest pain, palpitations, leg swelling.  Gastrointestinal: Negative for heartburn, nausea, vomiting, abdominal pain. Stool softener helps with her bowel movement Genitourinary: Negative for dysuria Musculoskeletal: Negative for back pain, fall in the facility.  Skin: Negative for itching, rash.  Neurological: Negative for dizziness. Psychiatric/Behavioral: Negative for depression    Past Medical History:  Diagnosis Date  . Abnormality of gait 10/02/2015  . Anemia   . Carpal tunnel syndrome   . Depression   . Diabetes mellitus   . Dyslipidemia   . Gastritis   . Hemiplegia as late effect of stroke ([place]) 10/02/2015  . Hemorrhoids   . Hiatal hernia   . Hyperlipidemia   .  Hypertension   . Iron deficiency anemia 2008   negative EGD and colonoscopy  . Osteoporosis   . Seizure disorder ([place])   . Seizures ([place])   . 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 Stroke ([place]) 12 1989  . Urinary incontinence   . Uterine fibroid    Past Surgical History:  Procedure Laterality Date  . BREAST SURGERY  1977  . CESAREAN SECTION     x2  . COLONOSCOPY  2008  . TRACHEOSTOMY    . TRACHEOSTOMY CLOSURE    . TUBAL LIGATION    . UPPER GASTROINTESTINAL ENDOSCOPY  2008     Medications:   Medication List       Accurate as of 03/13/16  3:06 PM. Always use your most recent med list.          acetaminophen 325 MG tablet Commonly known as:  TYLENOL Take 650 mg by mouth every 6 (six) hours as needed.   amLODipine 5 MG tablet Commonly known as:  NORVASC Take 5 mg by mouth daily.   aspirin 325 MG tablet Take 325 mg by mouth daily. For hypertension.   atenolol 100 MG tablet Commonly known as:  TENORMIN Take 100 mg by mouth daily.   atorvastatin 20 MG tablet Commonly known as:  LIPITOR Take 20 mg by mouth daily.   baclofen 5 mg Tabs tablet Commonly known as:  LIORESAL Take 5 mg by mouth 3 (three) times daily.   BIOFREEZE 4 % Gel Generic drug:  Menthol (Topical Analgesic) Apply 1 application topically 2 (two) times daily as needed (  Apply to knees).   calcium carbonate 750 MG chewable tablet Commonly known as:  TUMS EX Chew 2 tablets by mouth 2 (two) times daily as needed for heartburn.   ferrous sulfate 325 (65 FE) MG tablet Take 325 mg by mouth daily.   hydrochlorothiazide 12.5 MG capsule Commonly known as:  MICROZIDE Take 12.5 mg by mouth daily.   levETIRAcetam 1000 MG tablet Commonly known as:  KEPPRA Take 1,000 mg by mouth 2 (two) times daily. For conversion disorder with seizures or convulsions.   lisinopril 40 MG tablet Commonly known as:  PRINIVIL,ZESTRIL Take 40 mg by mouth daily.   loratadine 10 MG tablet Commonly known as:  CLARITIN Take 10  mg by mouth every morning. For allergies.   multivitamin with minerals Tabs tablet Take 1 tablet by mouth daily.   polyethylene glycol packet Commonly known as:  MIRALAX / GLYCOLAX Take 17 g by mouth daily.   promethazine 25 MG suppository Commonly known as:  PHENERGAN Place 25 mg rectally every 12 (twelve) hours as needed for nausea or vomiting (If nausea persist mor than 24 hours notify MD/NP).   promethazine 25 MG tablet Commonly known as:  PHENERGAN Take 25 mg by mouth every 6 (six) hours as needed for nausea or vomiting.   senna 8.6 MG tablet Commonly known as:  SENOKOT Take 2 tablets by mouth at bedtime.   Vitamin D3 2000 units Tabs Take 1 tablet by mouth daily.   zinc oxide 11.3 % Crea cream Commonly known as:  BALMEX Apply 1 application topically 3 (three) times daily as needed. Cleanse buttocks and apply afterwards       Immunizations: Immunization History  Administered Date(s) Administered  . Influenza,inj,Quad PF,36+ Mos 05/03/2014  . Influenza-Unspecified 12/26/2013  . PPD Test 02/06/2015, 07/08/2015     Physical Exam: Vitals:   03/13/16 1458  BP: 135/77  Pulse: 65  Resp: 20  Temp: (!) 96.5 F (35.8 C)  TempSrc: Oral  SpO2: 97%  Weight: 188 lb 12.8 oz (85.6 kg)  Height: 5\' 7"  (1.702 m)   Body mass index is 29.57 kg/m.  General- adult female, overweight, in no acute distress Head- normocephalic, atraumatic Throat- moist mucus membrane Cardiovascular- normal s1,s2, no murmur, no leg edema Respiratory- bilateral clear to auscultation Abdomen- bowel sounds present, soft, non tender Musculoskeletal- able to move her upper extremities with ROM limited in right shoulder, no active ROM to her LE, weakness to both her legs present, left foot externally rotated with some eversion noted, non tender Neurological- alert and oriented to person, place and time Skin- warm and dry Psychiatry- normal mood and affect     Labs reviewed: Basic Metabolic  Panel:  Recent Labs  07/05/15 08/02/15 09/20/15  NA 142 142 142  K 3.7 3.6 3.8  BUN 22* 21 20  CREATININE 0.5 0.6 0.6   Liver Function Tests:  Recent Labs  07/05/15 09/20/15  AST 16 13  ALT 18 15  ALKPHOS 91 109   No results for input(s): LIPASE, AMYLASE in the last 8760 hours. No results for input(s): AMMONIA in the last 8760 hours. CBC:  Recent Labs  07/05/15 08/02/15 09/20/15  WBC 5.7 6.8 6.4  HGB 10.8* 11.7* 11.4*  HCT 33* 36 34*  PLT  --  0*  --    Lab Results  Component Value Date   TSH 2.37 09/20/2015   Lipid Panel     Component Value Date/Time   CHOL 159 09/20/2015   TRIG 118 09/20/2015  HDL 40 09/20/2015   CHOLHDL 2.7 03/24/2010 0519   VLDL 13 03/24/2010 0519   LDLCALC 95 09/20/2015   Lab Results  Component Value Date   HGBA1C 5.8 09/20/2015      Assessment/Plan  Seizure disorder Remains seizure-free. Continue Keppra thousand milligrams twice a day and monitor.  Spastic paraparesis Continue baclofen 5 mg 3 times a day for muscle spasm. Fall precautions to be taken.  Chronic constipation [place]. Continue Senokot-S2 tablets at bedtime and MiraLAX daily for now. Hydration to be maintained.     Blanchie Serve, MD Internal Medicine [facility] [address] [place],  [address] Cell Phone (Monday-Friday 8 am - 5 pm): [phone] On Call: [phone] and follow prompts after 5 pm and on weekends Office Phone: [phone] Office Fax: [phone]

## 2016-03-18 ENCOUNTER — Telehealth (INDEPENDENT_AMBULATORY_CARE_PROVIDER_SITE_OTHER): Payer: Self-pay | Admitting: Orthopaedic Surgery

## 2016-03-18 NOTE — Telephone Encounter (Signed)
Ms. Schultheiss called saying she forgot to ask Dr. Erlinda Hong about diabetic shoes. She's wondering if there's a program available where she wouldn't have to pay for them. Please give her a phone call regarding this.  Pt's ph# [phone] She lives in a nursing home so when the phone is answered please say, "[place] [facility]" Thank you.

## 2016-03-18 NOTE — Telephone Encounter (Signed)
I'm pretty sure that medicare will pay for diabetic shoes.  Can you ask [employer] or [employer]

## 2016-03-18 NOTE — Telephone Encounter (Signed)
Please advise 

## 2016-03-20 LAB — HEPATIC FUNCTION PANEL
ALT: 17 U/L (ref 7–35)
AST: 14 U/L (ref 13–35)
Alkaline Phosphatase: 116 U/L (ref 25–125)
BILIRUBIN, TOTAL: 0.3 mg/dL

## 2016-03-20 LAB — HEMOGLOBIN A1C: HEMOGLOBIN A1C: 6.1

## 2016-03-20 LAB — LIPID PANEL
Cholesterol: 168 mg/dL (ref 0–200)
HDL: 53 mg/dL (ref 35–70)
LDL Cholesterol: 90 mg/dL
Triglycerides: 127 mg/dL (ref 40–160)

## 2016-03-20 LAB — CBC AND DIFFERENTIAL
HCT: 38 % (ref 36–46)
HEMOGLOBIN: 12.9 g/dL (ref 12.0–16.0)
WBC: 7.8 10^3/mL

## 2016-03-20 LAB — TSH: TSH: 2.61 u[IU]/mL (ref 0.41–5.90)

## 2016-03-20 LAB — BASIC METABOLIC PANEL
BUN: 16 mg/dL (ref 4–21)
CREATININE: 0.5 mg/dL (ref 0.5–1.1)
GLUCOSE: 115 mg/dL
POTASSIUM: 3.6 mmol/L (ref 3.4–5.3)
SODIUM: 143 mmol/L (ref 137–147)

## 2016-03-25 NOTE — Telephone Encounter (Signed)
Do you know anything about this? 

## 2016-03-26 NOTE — Telephone Encounter (Signed)
Patient would need to get the prescription from endocrinologist or whoever is treating her diabetes.

## 2016-03-26 NOTE — Telephone Encounter (Signed)
Called [facility] and left message with nurse. See message below

## 2016-04-14 ENCOUNTER — Encounter: Payer: Self-pay | Admitting: Neurology

## 2016-04-14 ENCOUNTER — Ambulatory Visit (INDEPENDENT_AMBULATORY_CARE_PROVIDER_SITE_OTHER): Payer: Medicare Other | Admitting: Neurology

## 2016-04-14 VITALS — BP 122/77 | HR 59 | Ht 67.0 in | Wt 188.0 lb

## 2016-04-14 DIAGNOSIS — E538 Deficiency of other specified B group vitamins: Secondary | ICD-10-CM | POA: Diagnosis not present

## 2016-04-14 DIAGNOSIS — R269 Unspecified abnormalities of gait and mobility: Secondary | ICD-10-CM

## 2016-04-14 DIAGNOSIS — R569 Unspecified convulsions: Secondary | ICD-10-CM

## 2016-04-14 DIAGNOSIS — R413 Other amnesia: Secondary | ICD-10-CM | POA: Diagnosis not present

## 2016-04-14 NOTE — Progress Notes (Signed)
Reason for visit: Seizures  [name] [name] is an 60 y.o. female  History of present illness:  [name] [name] is a 60 year old right-handed black female with a history of bilateral frontal strokes with subsequent seizures. The patient been on Keppra, she has been well controlled on this medication. The patient resides in an extended care facility, she does not operate a motor vehicle. Within the last month she has noted some problems with short-term memory, difficulty with remembering names for people. She otherwise denies any new medical issues. She does have spasticity involving both lower extremities, she indicates that this is not causing any problems with day-to-day care or activities of daily living. The patient is not having discomfort or difficulty sleeping at night because of the spasticity. The patient returns to this office for an evaluation.  Past Medical History:  Diagnosis Date  . Abnormality of gait 10/02/2015  . Anemia   . Carpal tunnel syndrome   . Depression   . Diabetes mellitus   . Dyslipidemia   . Gastritis   . Hemiplegia as late effect of stroke ([place]) 10/02/2015  . Hemorrhoids   . Hiatal hernia   . Hyperlipidemia   . Hypertension   . Iron deficiency anemia 2008   negative EGD and colonoscopy  . Osteoporosis   . Seizure disorder ([place])   . Seizures ([place])   . 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 Stroke ([place]) 12 1989  . Urinary incontinence   . Uterine fibroid     Past Surgical History:  Procedure Laterality Date  . BREAST SURGERY  1977  . CESAREAN SECTION     x2  . COLONOSCOPY  2008  . TRACHEOSTOMY    . TRACHEOSTOMY CLOSURE    . TUBAL LIGATION    . UPPER GASTROINTESTINAL ENDOSCOPY  2008    Family History  Problem Relation Age of Onset  . Heart disease Paternal Grandfather   . Hypertension Paternal Grandfather   . Prostate cancer Paternal Uncle   . Ovarian cancer Mother   . Hypertension Maternal Grandmother   . Stroke Maternal Grandmother   . Diabetes  Brother   . Heart attack Brother   . Diabetes Cousin   . Colon cancer Neg Hx     Social history:  reports that she has never smoked. She has never used smokeless tobacco. She reports that she does not drink alcohol or use drugs.    Allergies  Allergen Reactions  . Amoxicillin Itching  . Cephalexin Itching  . Penicillins Itching    Medications:  Prior to Admission medications   Medication Sig Start Date End Date Taking? Authorizing Provider  acetaminophen (TYLENOL) 325 MG tablet Take 650 mg by mouth every 6 (six) hours as needed.   Yes Historical Provider, MD  amLODipine (NORVASC) 5 MG tablet Take 5 mg by mouth daily.   Yes Historical Provider, MD  aspirin 325 MG tablet Take 325 mg by mouth daily. For hypertension.   Yes Historical Provider, MD  atenolol (TENORMIN) 100 MG tablet Take 100 mg by mouth daily.   Yes Historical Provider, MD  atorvastatin (LIPITOR) 20 MG tablet Take 20 mg by mouth daily.   Yes Historical Provider, MD  baclofen (LIORESAL) 5 mg TABS tablet Take 5 mg by mouth 3 (three) times daily.    Yes Historical Provider, MD  calcium carbonate (TUMS EX) 750 MG chewable tablet Chew 2 tablets by mouth 2 (two) times daily as needed for heartburn.   Yes Historical Provider, MD  Cholecalciferol (VITAMIN  D3) 2000 units TABS Take 1 tablet by mouth daily.   Yes Historical Provider, MD  ferrous sulfate 325 (65 FE) MG tablet Take 325 mg by mouth daily.    Yes Historical Provider, MD  hydrochlorothiazide (MICROZIDE) 12.5 MG capsule Take 12.5 mg by mouth daily.   Yes Historical Provider, MD  levETIRAcetam (KEPPRA) 1000 MG tablet Take 1,000 mg by mouth 2 (two) times daily. For conversion disorder with seizures or convulsions.   Yes Historical Provider, MD  lisinopril (PRINIVIL,ZESTRIL) 40 MG tablet Take 40 mg by mouth daily.   Yes Historical Provider, MD  loratadine (CLARITIN) 10 MG tablet Take 10 mg by mouth every morning. For allergies.   Yes Historical Provider, MD  Menthol, Topical  Analgesic, (BIOFREEZE) 4 % GEL Apply 1 application topically 2 (two) times daily as needed (Apply to knees).   Yes Historical Provider, MD  Multiple Vitamin (MULTIVITAMIN WITH MINERALS) TABS tablet Take 1 tablet by mouth daily. 02/23/14  Yes Hosie Poisson, MD  polyethylene glycol (MIRALAX / GLYCOLAX) packet Take 17 g by mouth daily.    Yes Historical Provider, MD  promethazine (PHENERGAN) 25 MG suppository Place 25 mg rectally every 12 (twelve) hours as needed for nausea or vomiting (If nausea persist mor than 24 hours notify MD/NP).   Yes Historical Provider, MD  promethazine (PHENERGAN) 25 MG tablet Take 25 mg by mouth every 6 (six) hours as needed for nausea or vomiting.   Yes Historical Provider, MD  senna (SENOKOT) 8.6 MG tablet Take 2 tablets by mouth at bedtime.    Yes Historical Provider, MD  zinc oxide (BALMEX) 11.3 % CREA cream Apply 1 application topically 3 (three) times daily as needed. Cleanse buttocks and apply afterwards   Yes Historical Provider, MD    ROS:  Out of a complete 14 system review of symptoms, the patient complains only of the following symptoms, and all other reviewed systems are negative.  Fatigue Black stools, constipation Insomnia, daytime sleepiness Difficulty urinating, decreased urine Walking difficulty Memory loss, speech difficulty, weakness  Blood pressure 122/77, pulse (!) 59, height 5\' 7"  (1.702 m), weight 188 lb (85.3 kg), last menstrual period 06/24/2013.  Physical Exam  General: The patient is alert and cooperative at the time of the examination.  Neuromuscular: The patient has significant restriction of abduction of the right arm, and elevate the arm only about 45.  Skin: No significant peripheral edema is noted.   Neurologic Exam  Mental status: The patient is alert and oriented x 3 at the time of the examination. The patient has apparent normal recent and remote memory, with an apparently normal attention span and concentration  ability.   Cranial nerves: Facial symmetry is present. Speech is normal, no aphasia or dysarthria is noted. Extraocular movements are full. Visual fields are full.  Motor: The patient has good strength in the upper extremities. With the lower extremity, increased motor tone is noted, minimal voluntary use of the lower extremities is noted.  Sensory examination: Soft touch sensation is symmetric on the face, arms, and legs.  Coordination: The patient has good finger-nose-finger bilaterally. The patient is not able to perform heel-to-shin on either side.  Gait and station: The patient is wheelchair-bound, cannot ambulate the patient.  Reflexes: Deep tendon reflexes are symmetric.   Assessment/Plan:  1. History of bifrontal stroke  2. Paraparesis, lower extremity spasticity  3. History of seizures, well controlled  4. Reported memory disturbance  The patient reports some problems with memory that began only about  a month ago. The patient will undergo blood work today, we will follow the memory problem over time. The patient will follow-up in about 6 months. We will continue the Keppra dosing for now.  Jill Alexanders MD 04/14/2016 11:09 AM  [facility] [address] [place], [place] [address]  Phone [phone] Fax [phone]

## 2016-04-15 ENCOUNTER — Non-Acute Institutional Stay (SKILLED_NURSING_FACILITY): Payer: Medicare Other | Admitting: Internal Medicine

## 2016-04-15 ENCOUNTER — Encounter: Payer: Self-pay | Admitting: Internal Medicine

## 2016-04-15 DIAGNOSIS — Z8673 Personal history of transient ischemic attack (TIA), and cerebral infarction without residual deficits: Secondary | ICD-10-CM | POA: Diagnosis not present

## 2016-04-15 DIAGNOSIS — M62838 Other muscle spasm: Secondary | ICD-10-CM | POA: Diagnosis not present

## 2016-04-15 DIAGNOSIS — I69359 Hemiplegia and hemiparesis following cerebral infarction affecting unspecified side: Secondary | ICD-10-CM | POA: Diagnosis not present

## 2016-04-15 LAB — RPR: RPR: NONREACTIVE

## 2016-04-15 LAB — VITAMIN B12: VITAMIN B 12: 1623 pg/mL — AB (ref 232–1245)

## 2016-04-15 LAB — HIV ANTIBODY (ROUTINE TESTING W REFLEX): HIV Screen 4th Generation wRfx: NONREACTIVE

## 2016-04-15 LAB — SEDIMENTATION RATE: Sed Rate: 12 mm/hr (ref 0–40)

## 2016-04-15 NOTE — Progress Notes (Signed)
LOCATION: [facility]   Code Status: Full Code  Goals of care: Advanced Directive information Advanced Directives 01/08/2015  Does Patient Have a Medical Advance Directive? No  Would patient like information on creating a medical advance directive? No - patient declined information  Pre-existing out of facility DNR order (yellow form or pink MOST form) -    Extended Emergency Contact Information Primary Emergency Contact: Daffern,Lauren Address: [place], [place] of [place] Phone: [phone] Relation: Daughter   Allergies  Allergen Reactions  . Amoxicillin Itching  . Cephalexin Itching  . Penicillins Itching    Chief Complaint  Patient presents with  . Medical Management of Chronic Issues    Routine Visit     HPI:  Patient is a 60 y.o. female seen today for routine visit. She had nasal congestion for a few days which has now self resolved. Her shoulder pain has been under control with current pain regimen. She is out of bed daily. No falls reported. Denies any concerning this visit. No new concerns from nursing staff.   Review of Systems:  Constitutional: Negative for fever. Energy level is good.  HENT: Negative for headache, sore throat, difficulty swallowing. Positive for seasonal allergies.   Eyes: Negative for blurred vision, double vision and discharge. Wears glasses. Respiratory: Negative for cough, shortness of breath and wheezing.   Cardiovascular: Negative for chest pain, palpitations, leg swelling.  Gastrointestinal: Negative for heartburn, nausea, vomiting, abdominal pain. Stool softener helps with her bowel movement Genitourinary: Negative for dysuria Musculoskeletal: Negative for fall in the facility.  Skin: Negative for itching, rash.  Neurological: Negative for dizziness. Psychiatric/Behavioral: Negative for depression    Past Medical History:  Diagnosis Date  . Abnormality of gait 10/02/2015  . Anemia     . Carpal tunnel syndrome   . Depression   . Diabetes mellitus   . Dyslipidemia   . Gastritis   . Hemiplegia as late effect of stroke ([place]) 10/02/2015  . Hemorrhoids   . Hiatal hernia   . Hyperlipidemia   . Hypertension   . Iron deficiency anemia 2008   negative EGD and colonoscopy  . Osteoporosis   . Seizure disorder ([place])   . Seizures ([place])   . 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 Stroke ([place]) 12 1989  . Urinary incontinence   . Uterine fibroid    Past Surgical History:  Procedure Laterality Date  . BREAST SURGERY  1977  . CESAREAN SECTION     x2  . COLONOSCOPY  2008  . TRACHEOSTOMY    . TRACHEOSTOMY CLOSURE    . TUBAL LIGATION    . UPPER GASTROINTESTINAL ENDOSCOPY  2008     Medications: Allergies as of 04/15/2016      Reactions   Amoxicillin Itching   Cephalexin Itching   Penicillins Itching      Medication List       Accurate as of 04/15/16  2:57 PM. Always use your most recent med list.          acetaminophen 325 MG tablet Commonly known as:  TYLENOL Take 650 mg by mouth every 6 (six) hours as needed.   amLODipine 5 MG tablet Commonly known as:  NORVASC Take 5 mg by mouth daily.   aspirin 325 MG tablet Take 325 mg by mouth daily. For hypertension.   atenolol 100 MG tablet Commonly known as:  TENORMIN Take 100 mg by mouth daily.  atorvastatin 20 MG tablet Commonly known as:  LIPITOR Take 20 mg by mouth daily.   baclofen 5 mg Tabs tablet Commonly known as:  LIORESAL Take 5 mg by mouth 3 (three) times daily.   BIOFREEZE 4 % Gel Generic drug:  Menthol (Topical Analgesic) Apply 1 application topically 2 (two) times daily as needed (Apply to knees).   BIOFREEZE EX Apply 1 application topically 3 (three) times daily.   calcium carbonate 750 MG chewable tablet Commonly known as:  TUMS EX Chew 2 tablets by mouth 2 (two) times daily as needed for heartburn.   ferrous sulfate 325 (65 FE) MG tablet Take 325 mg by mouth daily.    hydrochlorothiazide 12.5 MG capsule Commonly known as:  MICROZIDE Take 12.5 mg by mouth daily.   levETIRAcetam 1000 MG tablet Commonly known as:  KEPPRA Take 1,000 mg by mouth 2 (two) times daily. For conversion disorder with seizures or convulsions.   lisinopril 40 MG tablet Commonly known as:  PRINIVIL,ZESTRIL Take 40 mg by mouth daily.   loratadine 10 MG tablet Commonly known as:  CLARITIN Take 10 mg by mouth every morning. For allergies.   multivitamin with minerals Tabs tablet Take 1 tablet by mouth daily.   polyethylene glycol packet Commonly known as:  MIRALAX / GLYCOLAX Take 17 g by mouth every 8 (eight) hours as needed.   promethazine 25 MG suppository Commonly known as:  PHENERGAN Place 25 mg rectally every 12 (twelve) hours as needed for nausea or vomiting (If nausea persist mor than 24 hours notify MD/NP).   promethazine 25 MG tablet Commonly known as:  PHENERGAN Take 25 mg by mouth every 6 (six) hours as needed for nausea or vomiting.   senna 8.6 MG tablet Commonly known as:  SENOKOT Take 2 tablets by mouth at bedtime.   Vitamin D3 2000 units Tabs Take 1 tablet by mouth daily.   zinc oxide 11.3 % Crea cream Commonly known as:  BALMEX Apply 1 application topically 3 (three) times daily as needed. Cleanse buttocks and apply afterwards       Immunizations: Immunization History  Administered Date(s) Administered  . Influenza,inj,Quad PF,36+ Mos 05/03/2014  . Influenza-Unspecified 12/26/2013  . PPD Test 02/06/2015, 07/08/2015     Physical Exam: Vitals:   04/15/16 1445  BP: 118/80  Pulse: 70  Resp: 18  Temp: 97.1 F (36.2 C)  TempSrc: Oral  SpO2: 98%  Weight: 190 lb 9.6 oz (86.5 kg)  Height: 5\' 7"  (1.702 m)   Body mass index is 29.85 kg/m.  General- adult female, overweight, in no acute distress Head- normocephalic, atraumatic Throat- moist mucus membrane Eyes- PERRLA, EOMI, no pallor, no icterus Neck- no cervical  lymphadenopathy Cardiovascular- normal s1,s2, no murmur, trace leg edema left > right Respiratory- bilateral clear to auscultation, no wheeze, no rhonchi, no crackles, no use of accessory muscles Abdomen- bowel sounds present, soft, non tender Musculoskeletal- able to move her upper extremities with ROM limited in right shoulder, no active ROM to her LE, weakness to both her legs present, left foot externally rotated with some eversion noted, non tender Neurological- alert and oriented to person, place and time Skin- warm and dry Psychiatry- normal mood and affect     Labs reviewed: Basic Metabolic Panel:  Recent Labs  08/02/15 09/20/15 03/20/16  NA 142 142 143  K 3.6 3.8 3.6  BUN 21 20 16   CREATININE 0.6 0.6 0.5   Liver Function Tests:  Recent Labs  07/05/15 09/20/15 03/20/16  AST  16 13 14   ALT 18 15 17   ALKPHOS 91 109 116   No results for input(s): LIPASE, AMYLASE in the last 8760 hours. No results for input(s): AMMONIA in the last 8760 hours. 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 Continue baclofen 5 mg 3 times a day for muscle spasms.  Hemiplegia post CVA Continue with supportive therapy. Fall precautions. Continue baclofen for muscle spasm. Continue her statin.  History of CVA Continue aspirin 325 mg daily and atorvastatin 20 mg daily. Continue her blood pressure medication.    Blanchie Serve, MD Internal Medicine [facility] [address] [place],  [place] [address] Cell Phone (Monday-Friday 8 am - 5 pm): [phone] On Call: [phone] and follow prompts after 5 pm and on weekends Office Phone: [phone] Office Fax: [phone]

## 2016-04-21 DIAGNOSIS — G8220 Paraplegia, unspecified: Secondary | ICD-10-CM | POA: Insufficient documentation

## 2016-05-05 LAB — BASIC METABOLIC PANEL
BUN: 10 mg/dL (ref 4–21)
CREATININE: 0.5 mg/dL (ref 0.5–1.1)
Glucose: 115 mg/dL
POTASSIUM: 3.7 mmol/L (ref 3.4–5.3)
Sodium: 142 mmol/L (ref 137–147)

## 2016-05-05 LAB — CBC AND DIFFERENTIAL
HCT: 37 % (ref 36–46)
Hemoglobin: 12.3 g/dL (ref 12.0–16.0)
WBC: 5 10*3/mL

## 2016-06-02 LAB — VITAMIN D 25 HYDROXY (VIT D DEFICIENCY, FRACTURES): Vit D, 25-Hydroxy: 57.08

## 2016-06-26 LAB — BASIC METABOLIC PANEL
BUN: 13 mg/dL (ref 4–21)
CREATININE: 0.6 mg/dL (ref 0.5–1.1)
Glucose: 131 mg/dL
Potassium: 3.2 mmol/L — AB (ref 3.4–5.3)
Sodium: 140 mmol/L (ref 137–147)

## 2016-07-22 ENCOUNTER — Encounter: Payer: Self-pay | Admitting: Internal Medicine

## 2016-07-22 ENCOUNTER — Non-Acute Institutional Stay (SKILLED_NURSING_FACILITY): Payer: Medicare Other | Admitting: Internal Medicine

## 2016-07-22 DIAGNOSIS — D509 Iron deficiency anemia, unspecified: Secondary | ICD-10-CM

## 2016-07-22 DIAGNOSIS — E559 Vitamin D deficiency, unspecified: Secondary | ICD-10-CM

## 2016-07-22 DIAGNOSIS — E876 Hypokalemia: Secondary | ICD-10-CM | POA: Diagnosis not present

## 2016-07-22 DIAGNOSIS — I10 Essential (primary) hypertension: Secondary | ICD-10-CM | POA: Diagnosis not present

## 2016-07-22 DIAGNOSIS — E785 Hyperlipidemia, unspecified: Secondary | ICD-10-CM

## 2016-07-22 NOTE — Progress Notes (Signed)
LOCATION: [facility]   Code Status: Full Code  Goals of care: Advanced Directive information Advanced Directives 01/08/2015  Does Patient Have a Medical Advance Directive? No  Would patient like information on creating a medical advance directive? No - patient declined information  Pre-existing out of facility DNR order (yellow form or pink MOST form) -    Extended Emergency Contact Information Primary Emergency Contact: Jachim,Lauren Address: [place], [place] Phone: [phone] Relation: Daughter   Allergies  Allergen Reactions  . Amoxicillin Itching  . Cephalexin Itching  . Penicillins Itching    Chief Complaint  Patient presents with  . Medical Management of Chronic Issues    Routine Visit      HPI:  Patient is a 61 y.o. female seen today for routine visit. She denies any concern this visit. No new concerns from nursing staff.   Review of Systems:  Constitutional: Negative for fever. HENT: Negative for headache, sore throat, difficulty swallowing.  Eyes: Negative for blurred vision, double vision and discharge. Wears glasses. Respiratory: Negative for cough, shortness of breath  Cardiovascular: Negative for chest pain, palpitations Gastrointestinal: Negative for heartburn, nausea, vomiting, abdominal pain.  Genitourinary: Negative for dysuria Musculoskeletal: Negative for fall in the facility.  Skin: Negative for itching, rash.  Neurological: Negative for dizziness. Psychiatric/Behavioral: Negative for depression    Past Medical History:  Diagnosis Date  . Abnormality of gait 10/02/2015  . Anemia   . Carpal tunnel syndrome   . Depression   . Diabetes mellitus   . Dyslipidemia   . Gastritis   . Hemiplegia as late effect of stroke ([place]) 10/02/2015  . Hemorrhoids   . Hiatal hernia   . Hyperlipidemia   . Hypertension   . Iron deficiency anemia 2008   negative EGD and colonoscopy  . Osteoporosis     . Seizure disorder ([place])   . Seizures ([place])   . 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 Stroke ([place]) 12 1989  . Urinary incontinence   . Uterine fibroid    Past Surgical History:  Procedure Laterality Date  . BREAST SURGERY  1977  . CESAREAN SECTION     x2  . COLONOSCOPY  2008  . TRACHEOSTOMY    . TRACHEOSTOMY CLOSURE    . TUBAL LIGATION    . UPPER GASTROINTESTINAL ENDOSCOPY  2008     Medications: Allergies as of 07/22/2016      Reactions   Amoxicillin Itching   Cephalexin Itching   Penicillins Itching      Medication List       Accurate as of 07/22/16  2:42 PM. Always use your most recent med list.          acetaminophen 325 MG tablet Commonly known as:  TYLENOL Take 650 mg by mouth every 6 (six) hours as needed.   amLODipine 5 MG tablet Commonly known as:  NORVASC Take 5 mg by mouth daily.   aspirin 325 MG tablet Take 325 mg by mouth daily. For hypertension.   atenolol 100 MG tablet Commonly known as:  TENORMIN Take 100 mg by mouth daily.   atorvastatin 20 MG tablet Commonly known as:  LIPITOR Take 20 mg by mouth daily.   baclofen 5 mg Tabs tablet Commonly known as:  LIORESAL Take 5 mg by mouth 3 (three) times daily.   BIOFREEZE 4 % Gel Generic drug:  Menthol (Topical Analgesic) Apply 1 application topically  2 (two) times daily as needed (Apply to knees).   BIOFREEZE EX Apply 1 application topically 3 (three) times daily.   calcium carbonate 750 MG chewable tablet Commonly known as:  TUMS EX Chew 2 tablets by mouth 2 (two) times daily as needed for heartburn.   ferrous sulfate 325 (65 FE) MG tablet Take 325 mg by mouth daily.   guaiFENesin 100 MG/5ML Soln Commonly known as:  ROBITUSSIN Take 10 mLs by mouth every 4 (four) hours as needed for cough.   hydrochlorothiazide 25 MG tablet Commonly known as:  HYDRODIURIL Take 25 mg by mouth daily.   levETIRAcetam 1000 MG tablet Commonly known as:  KEPPRA Take 1,000 mg by mouth 2 (two) times daily.  For conversion disorder with seizures or convulsions.   lisinopril 40 MG tablet Commonly known as:  PRINIVIL,ZESTRIL Take 40 mg by mouth daily.   loratadine 10 MG tablet Commonly known as:  CLARITIN Take 10 mg by mouth every morning. For allergies.   multivitamin with minerals Tabs tablet Take 1 tablet by mouth daily.   polyethylene glycol packet Commonly known as:  MIRALAX / GLYCOLAX Take 17 g by mouth every 8 (eight) hours as needed.   promethazine 25 MG tablet Commonly known as:  PHENERGAN Take 25 mg by mouth every 6 (six) hours as needed for nausea or vomiting.   senna 8.6 MG tablet Commonly known as:  SENOKOT Take 2 tablets by mouth at bedtime.   sodium chloride 0.65 % Soln nasal spray Commonly known as:  OCEAN Place 2 sprays into both nostrils every 4 (four) hours as needed.   Vitamin D3 2000 units Tabs Take 1 tablet by mouth daily.   zinc oxide 11.3 % Crea cream Commonly known as:  BALMEX Apply 1 application topically 3 (three) times daily as needed. Cleanse buttocks and apply afterwards       Immunizations: Immunization History  Administered Date(s) Administered  . Influenza,inj,Quad PF,36+ Mos 05/03/2014  . Influenza-Unspecified 12/26/2013  . PPD Test 02/06/2015, 07/08/2015  . Pneumococcal Polysaccharide-23 06/30/2013  . Tdap 04/27/2010     Physical Exam: Vitals:   07/22/16 1428  BP: (!) 147/83  Pulse: 76  Resp: 18  Temp: 97.6 F (36.4 C)  TempSrc: Oral  SpO2: 95%  Weight: 198 lb 3.2 oz (89.9 kg)  Height: 5\' 7"  (1.702 m)   Body mass index is 31.04 kg/m.  General- adult female, obese, in no acute distress Head- normocephalic, atraumatic Throat- moist mucus membrane Eyes- PERRLA, EOMI, no pallor, no icterus Neck- no cervical lymphadenopathy Cardiovascular- normal s1,s2, no murmur, trace leg edema left > right Respiratory- bilateral clear to auscultation, no wheeze, no rhonchi, no crackles, no use of accessory muscles Abdomen- bowel  sounds present, soft, non tender Musculoskeletal- able to move her upper extremities with ROM limited in right shoulder, no active ROM to her LE, weakness to both her legs present, left foot externally rotated with some eversion noted, non tender Neurological- alert and oriented to person, place and time Skin- warm and dry Psychiatry- normal mood and affect     Labs reviewed: Basic Metabolic Panel:  Recent Labs  03/20/16 05/05/16 06/26/16  NA 143 142 140  K 3.6 3.7 3.2*  BUN 16 10 13   CREATININE 0.5 0.5 0.6   Liver Function Tests:  Recent Labs  09/20/15 03/20/16  AST 13 14  ALT 15 17  ALKPHOS 109 116   No results for input(s): LIPASE, AMYLASE in the last 8760 hours. No results for input(s):  AMMONIA in the last 8760 hours. CBC:  Recent Labs  08/02/15 09/20/15 03/20/16 05/05/16  WBC 6.8 6.4 7.8 5.0  HGB 11.7* 11.4* 12.9 12.3  HCT 36 34* 38 37  PLT 0*  --   --   --    Lab Results  Component Value Date   TSH 2.61 03/20/2016   Lipid Panel     Component Value Date/Time   CHOL 168 03/20/2016   TRIG 127 03/20/2016   HDL 53 03/20/2016   CHOLHDL 2.7 03/24/2010 0519   VLDL 13 03/24/2010 0519   LDLCALC 90 03/20/2016   Lab Results  Component Value Date   HGBA1C 6.1 03/20/2016      Assessment/Plan  Vitamin D deficiency Continue vitamin D3 2000 units daily. Monitor vitamin D level. 3. Fall precautions to be taken.  Iron deficiency anemia Monitor CBC periodically. Continue ferrous sulfate 325 mg daily.  Essential hypertension Monitor blood pressure reading. Currently blood pressure reading on review has been stable with systolic blood pressure below 299 and diastolic blood pressure below 18 for most of the readings. Continue atenolol 100 mg daily and Norvasc 5 mg daily. Continue lisinopril 40 mg daily and hydrochlorothiazide 25 mg daily as well. Reviewed BMP.  Hyperlipidemia Lipid Panel     Component Value Date/Time   CHOL 168 03/20/2016   TRIG 127  03/20/2016   HDL 53 03/20/2016   CHOLHDL 2.7 03/24/2010 0519   VLDL 13 03/24/2010 0519   LDLCALC 90 03/20/2016   LDL goal is less than 70. Continue atorvastatin but increased this to 40 mg daily and monitor. Check lipid panel in 3 months along with LFT.  Hypokalemia With her on HCTZ, low k on lab from 06/26/16. Start kcl 20 meq daily x 3 days, then check bmp. If on lower side of normal, consider adding kcl supplement  Blanchie Serve, MD Internal Medicine [facility] [address] [place], [place] [address] Cell Phone (Monday-Friday 8 am - 5 pm): [phone] On Call: [phone] and follow prompts after 5 pm and on weekends Office Phone: [phone] Office Fax: [phone]

## 2016-10-15 ENCOUNTER — Encounter: Payer: Self-pay | Admitting: Neurology

## 2016-10-15 ENCOUNTER — Ambulatory Visit (INDEPENDENT_AMBULATORY_CARE_PROVIDER_SITE_OTHER): Payer: Medicare Other | Admitting: Neurology

## 2016-10-15 VITALS — BP 114/76 | HR 68 | Ht 67.0 in

## 2016-10-15 DIAGNOSIS — R42 Dizziness and giddiness: Secondary | ICD-10-CM | POA: Diagnosis not present

## 2016-10-15 DIAGNOSIS — R269 Unspecified abnormalities of gait and mobility: Secondary | ICD-10-CM | POA: Diagnosis not present

## 2016-10-15 DIAGNOSIS — R51 Headache: Secondary | ICD-10-CM | POA: Diagnosis not present

## 2016-10-15 DIAGNOSIS — R569 Unspecified convulsions: Secondary | ICD-10-CM

## 2016-10-15 DIAGNOSIS — G8929 Other chronic pain: Secondary | ICD-10-CM

## 2016-10-15 DIAGNOSIS — R413 Other amnesia: Secondary | ICD-10-CM | POA: Diagnosis not present

## 2016-10-15 DIAGNOSIS — R519 Headache, unspecified: Secondary | ICD-10-CM

## 2016-10-15 HISTORY — DX: Other amnesia: R41.3

## 2016-10-15 NOTE — Patient Instructions (Signed)
   We will get CT of the brain and get an EEG study. We will follow the memory problems.

## 2016-10-15 NOTE — Progress Notes (Signed)
Reason for visit: Seizures  [name] [name] is an 61 y.o. 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 The patient is residing at [facility], she claims that she has not been ambulatory in greater than one year. The patient has reported some changes in memory that have occurred. Over the last month she is having new symptoms of episodic dizziness lasting about 10 minutes associated with a mild spacey sensation. She is also having bifrontal headaches that have occurred over the last 2-3 months. She denies any medication changes around the time of these new symptoms. She denies blackout episodes, palpitations of the heart, heart racing, or any slurred speech or vision changes. The patient denies any ear pain or ringing in the ears or decreased hearing. She has not had any new numbness or weakness of the extremities, but she does have some intermittent numbness of both hands. She does not believe that her memory has changed much since her last visit here. She is on Keppra for seizures, she has not had recurrence of seizures that she knows of. She comes back to this office for reevaluation.  Past Medical History:  Diagnosis Date  . Abnormality of gait 10/02/2015  . Anemia   . Carpal tunnel syndrome   . Depression   . Diabetes mellitus   . Dyslipidemia   . Gastritis   . Hemiplegia as late effect of stroke ([place]) 10/02/2015  . Hemorrhoids   . Hiatal hernia   . Hyperlipidemia   . Hypertension   . Iron deficiency anemia 2008   negative EGD and colonoscopy  . Osteoporosis   . Seizure disorder ([place])   . Seizures ([place])   . 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 Stroke ([place]) 12 1989  . Urinary incontinence   . Uterine fibroid     Past Surgical History:  Procedure Laterality Date  . BREAST SURGERY  1977  . CESAREAN SECTION     x2  . COLONOSCOPY  2008  .  TRACHEOSTOMY    . TRACHEOSTOMY CLOSURE    . TUBAL LIGATION    . UPPER GASTROINTESTINAL ENDOSCOPY  2008    Family History  Problem Relation Age of Onset  . Heart disease Paternal Grandfather   . Hypertension Paternal Grandfather   . Prostate cancer Paternal Uncle   . Ovarian cancer Mother   . Hypertension Maternal Grandmother   . Stroke Maternal Grandmother   . Diabetes Brother   . Heart attack Brother   . Diabetes Cousin   . Colon cancer Neg Hx     Social history:  reports that she has never smoked. She has never used smokeless tobacco. She reports that she does not drink alcohol or use drugs.    Allergies  Allergen Reactions  . Amoxicillin Itching  . Cephalexin Itching  . Penicillins Itching    Medications:  Prior to Admission medications   Medication Sig Start Date End Date Taking? Authorizing Provider  acetaminophen (TYLENOL) 325 MG tablet Take 650 mg by mouth every 6 (six) hours as needed.   Yes [provider]  amLODipine (NORVASC) 5 MG tablet Take 5 mg by mouth daily.   Yes [provider]  aspirin 325 MG tablet Take 325 mg by mouth as needed. For hypertension.   Yes [provider]  atenolol (TENORMIN) 100 MG tablet Take 100 mg by mouth daily.   Yes [provider]  atorvastatin (LIPITOR) 20 MG tablet Take 20 mg by mouth daily.   Yes [provider]  baclofen (LIORESAL) 5 mg TABS tablet Take 5 mg by mouth 3 (three) times daily.    Yes [provider]  calcium carbonate (TUMS EX) 750 MG chewable tablet Chew 2 tablets by mouth 2 (two) times daily as needed for heartburn.   Yes [provider]  Dimethicone-Zinc Oxide-Vit A-D (A & D ZINC OXIDE EX) Apply 1 application topically as needed. To buttocks   Yes [provider]  ferrous sulfate 325 (65 FE) MG tablet Take 325 mg by mouth daily.    Yes [provider]  guaiFENesin (ROBITUSSIN) 100 MG/5ML SOLN Take 10 mLs by mouth every 4 (four) hours  as needed for cough.   Yes [provider]  hydrochlorothiazide (HYDRODIURIL) 25 MG tablet Take 25 mg by mouth daily.   Yes [provider]  levETIRAcetam (KEPPRA) 1000 MG tablet Take 1,000 mg by mouth 2 (two) times daily. For conversion disorder with seizures or convulsions.   Yes [provider]  lisinopril (PRINIVIL,ZESTRIL) 40 MG tablet Take 40 mg by mouth daily.   Yes [provider]  loratadine (CLARITIN) 10 MG tablet Take 10 mg by mouth every morning. For allergies.   Yes [provider]  Menthol, Topical Analgesic, (BIOFREEZE EX) Apply 1 application topically 3 (three) times daily.   Yes [provider]  Menthol, Topical Analgesic, (BIOFREEZE) 4 % GEL Apply 1 application topically 2 (two) times daily as needed (Apply to knees).   Yes [provider]  polyethylene glycol (MIRALAX / GLYCOLAX) packet Take 17 g by mouth every 8 (eight) hours as needed.    Yes [provider]  potassium chloride SA (K-DUR,KLOR-CON) 20 MEQ tablet Take 20 mEq by mouth daily.   Yes [provider]  promethazine (PHENERGAN) 25 MG tablet Take 25 mg by mouth every 6 (six) hours as needed for nausea or vomiting.   Yes [provider]  senna (SENOKOT) 8.6 MG tablet Take 2 tablets by mouth at bedtime.    Yes [provider]  sodium chloride (OCEAN) 0.65 % SOLN nasal spray Place 2 sprays into both nostrils every 4 (four) hours as needed.   Yes [provider]    ROS:  Out of a complete 14 system review of symptoms, the patient complains only of the following symptoms, and all other reviewed systems are negative.  Weight gain Heat intolerance Constipation Incontinence of the bladder Memory loss, dizziness, numbness, speech difficulty Depression  Blood pressure 114/76, pulse 68, height 5\' 7"  (1.702 m), last menstrual period 06/24/2013, SpO2 96 %.  Physical Exam  General: The patient is alert and cooperative  at the time of the examination.  Neuromuscular: The patient has restriction of elevation of the right arm, able to elevate it only about 30. Patient has better mobility on the left arm.  Skin: No significant peripheral edema is noted.   Neurologic Exam  Mental status: The patient is alert and oriented x 3 at the time of the examination. The patient has apparent normal recent and remote memory, with an apparently normal attention span and concentration ability. Mini-Mental Status Examination done today shows a total score 27/30.   Cranial nerves: Facial symmetry is present. Speech is normal, no aphasia or dysarthria is noted. Extraocular movements are full. Visual fields are full.  Motor: The patient has good strength in the upper extremities. With the lower extremities there is  increased motor tone, the patient has some ability to extend at the knees bilaterally, she cannot flex the hips well in the sitting position..  Sensory examination: Soft touch sensation is symmetric on the face, arms, and legs.  Coordination: The patient has good finger-nose-finger bilaterally. The patient is unable to perform heel-to-shin.  Gait and station: The patient is wheelchair-bound, she is unable to ambulate.  Reflexes: Deep tendon reflexes are symmetric.   Assessment/Plan:  1. Cerebrovascular disease, bifrontal encephalomalacia  2. History of seizures  3. Mild memory disturbance  4. New onset dizziness, headache  The patient reports episodes of dizziness that may last about 10 minutes associated with a mild spacey sensation. The patient also has reported new onset headaches. She will be sent for blood work to include a sedimentation rate. She will have an EEG study and a CT of the brain. The memory will be followed over time. She will continue on the [place]. She will follow-up in 6 months.  Jill Alexanders MD 10/15/2016 11:20 AM  [facility] [address] [place],  [address]  Phone [phone] Fax [phone]

## 2016-10-16 LAB — SEDIMENTATION RATE: SED RATE: 11 mm/h (ref 0–40)

## 2016-10-22 ENCOUNTER — Ambulatory Visit
Admission: RE | Admit: 2016-10-22 | Discharge: 2016-10-22 | Disposition: A | Discharge status: Home / Self Care | Payer: Medicare Other | Source: Ambulatory Visit | Attending: Neurology | Admitting: Neurology

## 2016-10-22 DIAGNOSIS — R42 Dizziness and giddiness: Secondary | ICD-10-CM

## 2016-10-22 DIAGNOSIS — R569 Unspecified convulsions: Secondary | ICD-10-CM

## 2016-10-22 DIAGNOSIS — R413 Other amnesia: Secondary | ICD-10-CM

## 2016-10-24 ENCOUNTER — Telehealth: Payer: Self-pay | Admitting: Neurology

## 2016-10-24 NOTE — Telephone Encounter (Signed)
  I called patient. CT the brain is stable from thousand 16, severe bifrontal encephalomalacia seen. An EEG study is pending. The patient has been having some episodes of being somewhat spacey.  CT head 10/22/16:  IMPRESSION:  Abnormal CT head (without) demonstrating: 1. Severe bifrontal encephalomalacia, stable from prior exams. 2. No acute findings. 3. No change from CT on 11/20/14.

## 2016-11-19 ENCOUNTER — Ambulatory Visit (INDEPENDENT_AMBULATORY_CARE_PROVIDER_SITE_OTHER): Payer: Medicare Other | Admitting: Neurology

## 2016-11-19 ENCOUNTER — Telehealth: Payer: Self-pay | Admitting: Neurology

## 2016-11-19 DIAGNOSIS — R413 Other amnesia: Secondary | ICD-10-CM

## 2016-11-19 DIAGNOSIS — R42 Dizziness and giddiness: Secondary | ICD-10-CM

## 2016-11-19 DIAGNOSIS — R569 Unspecified convulsions: Secondary | ICD-10-CM

## 2016-11-19 NOTE — Telephone Encounter (Signed)
I called and talked with the daughter. The patient has had an EEG study today showing only some mild intermittent slowing in the left temporal area, likely related to extensive encephalomalacia seen bilaterally on the head scan. The patient did not have epileptiform activity.

## 2016-11-19 NOTE — Procedures (Signed)
     History: [name] [name] is a 61 year old patient with a history of bifrontal encephalomalacia and a nonambulatory state. The patient has had episodes of dizziness and slight spacey sensations that have been occurring over the last several months. The patient is being evaluated for possible seizures. She is treated for seizures with Keppra. She has not had recurrence of her typical seizure events.  This is a routine EEG. No skull defects are noted. Medications include Norvasc, aspirin, Tenormin, Lipitor, baclofen, calcium, iron supplementation, hydrochlorothiazide, Keppra, lisinopril, MiraLAX, potassium, and Senokot.  EEG classification: Dysrhythmia grade 1 left temporal  Description of the recording: The background rhythms of this recording consists of a relatively well-modulated medium amplitude alpha rhythm of 9 Hz that is reactive to eye opening and closure. As the record progresses, photic stimulation is performed, this does result in a bilateral and symmetric photic drive response. Hyperventilation is also performed with a minimal buildup of the background rhythm activities without significant slowing seen. Intermittently during the recording, slight intermittent theta slowing seen in the left mid temporal area. At no time during the recording does there appear to be evidence of spike or spike-wave discharges. EKG monitor shows no evidence of cardiac rhythm abnormalities with a heart rate of 60.  Impression: This is an abnormal EEG recording secondary to intermittent slight slowing in the left temporal area, this may correlate with underlying cortical brain injury as noted above, no clear epileptiform discharges were seen.

## 2017-04-14 ENCOUNTER — Ambulatory Visit (INDEPENDENT_AMBULATORY_CARE_PROVIDER_SITE_OTHER): Payer: Medicare Other | Admitting: Adult Health

## 2017-04-14 ENCOUNTER — Encounter: Payer: Self-pay | Admitting: Adult Health

## 2017-04-14 VITALS — BP 146/83 | HR 68 | Ht 67.0 in | Wt 198.0 lb

## 2017-04-14 DIAGNOSIS — R569 Unspecified convulsions: Secondary | ICD-10-CM

## 2017-04-14 DIAGNOSIS — R413 Other amnesia: Secondary | ICD-10-CM | POA: Diagnosis not present

## 2017-04-14 NOTE — Progress Notes (Signed)
I have read the note, and I agree with the clinical assessment and plan.  [name] [name]   

## 2017-04-14 NOTE — Patient Instructions (Signed)
Your Plan:  Continue Keppra Memory score is stable If your symptoms worsen or you develop new symptoms please let us know.   Thank you for coming to see us at [facility]. I hope we have been able to provide you high quality care today.  You may receive a patient satisfaction survey over the next few weeks. We would appreciate your feedback and comments so that we may continue to improve ourselves and the health of our patients.  

## 2017-04-14 NOTE — Progress Notes (Signed)
PATIENT: [name] [name] DOB: [birth year]  REASON FOR VISIT: follow up HISTORY FROM: patient  HISTORY OF PRESENT ILLNESS: Today 04/14/17 Ms. [name] [name] is a 61 year old female with a history of seizures.  She returns today for follow-up.  She reports that she is doing well.  She continues on Keppra 1000 mg twice a day.  She denies any recent seizure events.  She reports that her memory has remained stable.  She continues to live at [facility].  She does require assistance with ADLs due to mobility issues.  She reports that she is able to feed herself.  Overall she feels that she has done fairly well.  She denies any new symptoms.  She returns today for an evaluation.  HISTORY Ms. 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 The patient is residing at [facility], she claims that she has not been ambulatory in greater than one year. The patient has reported some changes in memory that have occurred. Over the last month she is having new symptoms of episodic dizziness lasting about 10 minutes associated with a mild spacey sensation. She is also having bifrontal headaches that have occurred over the last 2-3 months. She denies any medication changes around the time of these new symptoms. She denies blackout episodes, palpitations of the heart, heart racing, or any slurred speech or vision changes. The patient denies any ear pain or ringing in the ears or decreased hearing. She has not had any new numbness or weakness of the extremities, but she does have some intermittent numbness of both hands. She does not believe that her memory has changed much since her last visit here. She is on Keppra for seizures, she has not had recurrence of seizures that she knows of. She comes back to this office for reevaluation.   REVIEW OF SYSTEMS: Out of a complete 14 system review of symptoms, the patient complains only  of the following symptoms, and all other reviewed systems are negative.  Cold intolerance, constipation, urgency, decreased concentration, moles  ALLERGIES: Allergies  Allergen Reactions  . Amoxicillin Itching  . Cephalexin Itching  . Penicillins Itching    HOME MEDICATIONS: Outpatient Medications Prior to Visit  Medication Sig Dispense Refill  . acetaminophen (TYLENOL) 325 MG tablet Take 650 mg by mouth every 6 (six) hours as needed.    Marland Kitchen amLODipine (NORVASC) 5 MG tablet Take 5 mg by mouth daily.    Marland Kitchen aspirin 325 MG tablet Take 325 mg by mouth as needed. For hypertension.    Marland Kitchen atenolol (TENORMIN) 100 MG tablet Take 100 mg by mouth daily.    Marland Kitchen atorvastatin (LIPITOR) 20 MG tablet Take 20 mg by mouth daily.    . baclofen (LIORESAL) 5 mg TABS tablet Take 5 mg by mouth 3 (three) times daily.     . calcium carbonate (TUMS EX) 750 MG chewable tablet Chew 2 tablets by mouth 2 (two) times daily as needed for heartburn.    . Dimethicone-Zinc Oxide-Vit A-D (A & D ZINC OXIDE EX) Apply 1 application topically as needed. To buttocks    . ferrous sulfate 325 (65 FE) MG tablet Take 325 mg by mouth daily.     Marland Kitchen guaiFENesin (ROBITUSSIN) 100 MG/5ML SOLN Take 10 mLs by mouth every 4 (four) hours as needed for cough.    . hydrochlorothiazide (HYDRODIURIL) 25 MG tablet Take 25 mg by mouth daily.    Marland Kitchen levETIRAcetam (KEPPRA) 1000 MG  tablet Take 1,000 mg by mouth 2 (two) times daily. For conversion disorder with seizures or convulsions.    Marland Kitchen lisinopril (PRINIVIL,ZESTRIL) 40 MG tablet Take 40 mg by mouth daily.    Marland Kitchen loratadine (CLARITIN) 10 MG tablet Take 10 mg by mouth every morning. For allergies.    . Menthol, Topical Analgesic, (BIOFREEZE) 4 % GEL Apply 1 application topically 2 (two) times daily as needed (Apply to knees).    . polyethylene glycol (MIRALAX / GLYCOLAX) packet Take 17 g by mouth every 8 (eight) hours as needed.     . potassium chloride SA (K-DUR,KLOR-CON) 20 MEQ tablet Take 20 mEq by mouth  daily.    . promethazine (PHENERGAN) 25 MG tablet Take 25 mg by mouth every 6 (six) hours as needed for nausea or vomiting.    . senna (SENOKOT) 8.6 MG tablet Take 2 tablets by mouth at bedtime.     . sodium chloride (OCEAN) 0.65 % SOLN nasal spray Place 2 sprays into both nostrils every 4 (four) hours as needed.    . Menthol, Topical Analgesic, (BIOFREEZE EX) Apply 1 application topically 3 (three) times daily.     No facility-administered medications prior to visit.     PAST MEDICAL HISTORY: Past Medical History:  Diagnosis Date  . Abnormality of gait 10/02/2015  . Anemia   . Carpal tunnel syndrome   . Depression   . Diabetes mellitus   . Dyslipidemia   . Gastritis   . Hemiplegia as late effect of stroke ([place]) 10/02/2015  . Hemorrhoids   . Hiatal hernia   . Hyperlipidemia   . Hypertension   . Iron deficiency anemia 2008   negative EGD and colonoscopy  . Memory difficulty 10/15/2016  . Osteoporosis   . Seizure disorder ([place])   . Seizures ([place])   . 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 Stroke ([place]) 12 1989  . Urinary incontinence   . Uterine fibroid     PAST SURGICAL HISTORY: Past Surgical History:  Procedure Laterality Date  . BREAST SURGERY  1977  . CESAREAN SECTION     x2  . COLONOSCOPY  2008  . TRACHEOSTOMY    . TRACHEOSTOMY CLOSURE    . TUBAL LIGATION    . UPPER GASTROINTESTINAL ENDOSCOPY  2008    FAMILY HISTORY: Family History  Problem Relation Age of Onset  . Heart disease Paternal Grandfather   . Hypertension Paternal Grandfather   . Prostate cancer Paternal Uncle   . Ovarian cancer Mother   . Hypertension Maternal Grandmother   . Stroke Maternal Grandmother   . Diabetes Brother   . Heart attack Brother   . Diabetes Cousin   . Colon cancer Neg Hx     SOCIAL HISTORY: Social History   Socioeconomic History  . Marital status: Divorced    Spouse name: Not on file  . Number of children: 2  . Years of education: Not on file  . Highest education level:  Not on file  Social Needs  . Financial resource strain: Not on file  . Food insecurity - worry: Not on file  . Food insecurity - inability: Not on file  . Transportation needs - medical: Not on file  . Transportation needs - non-medical: Not on file  Occupational History  . Occupation: Disabled  Tobacco Use  . Smoking status: Never Smoker  . Smokeless tobacco: Never Used  Substance and Sexual Activity  . Alcohol use: No  . Drug use: No  . Sexual activity: Not on  file  Other Topics Concern  . Not on file  Social History Narrative   Resides at skilled nursing facility secondary to multiple previous strokes and lack of family to care for her at home   Right-handed   Caffeine: 4 cups of coffee per day      PHYSICAL EXAM  Vitals:   04/14/17 0905  BP: (!) 146/83  Pulse: 68  Weight: 198 lb (89.8 kg)  Height: 5\' 7"  (1.702 m)   Body mass index is 31.01 kg/m.   MMSE - Mini Mental State Exam 04/14/2017 10/15/2016  Orientation to time 5 5  Orientation to Place 5 5  Registration 3 3  Attention/ Calculation 3 5  Recall 2 0  Language- name 2 objects 2 2  Language- repeat 1 1  Language- follow 3 step command 3 3  Language- read & follow direction 1 1  Write a sentence 1 1  Copy design 1 1  Total score 27 27     Generalized: Well developed, in no acute distress   Neurological examination  Mentation: Alert oriented to time, place, history taking. Follows all commands speech and language fluent Cranial nerve II-XII: Pupils were equal round reactive to light. Extraocular movements were full, visual field were full on confrontational test. Facial sensation and strength were normal. Uvula tongue midline. Head turning and shoulder shrug  were normal and symmetric. Motor: The motor testing reveals 5 over 5 strength of all 4 extremities. Good symmetric motor tone is noted throughout.  Sensory: Sensory testing is intact to soft touch on all 4 extremities. No evidence of extinction is  noted.  Coordination: Cerebellar testing reveals good finger-nose-finger and heel-to-shin bilaterally.  Gait and station: Gait is normal.  Reflexes: Deep tendon reflexes are symmetric and normal bilaterally.   DIAGNOSTIC DATA (LABS, IMAGING, TESTING) - I reviewed patient records, labs, notes, testing and imaging myself where available.  Lab Results  Component Value Date   WBC 5.0 05/05/2016   HGB 12.3 05/05/2016   HCT 37 05/05/2016   MCV 95.9 11/24/2014   PLT 0 (A) 08/02/2015      Component Value Date/Time   NA 140 06/26/2016   K 3.2 (A) 06/26/2016   CL 111 11/24/2014 0428   CO2 26 11/24/2014 0428   GLUCOSE 92 11/24/2014 0428   BUN 13 06/26/2016   CREATININE 0.6 06/26/2016   CREATININE 0.52 11/24/2014 0428   CALCIUM 8.8 (L) 11/24/2014 0428   PROT 5.6 (L) 11/22/2014 0305   ALBUMIN 2.9 (L) 11/22/2014 0305   AST 14 03/20/2016   ALT 17 03/20/2016   ALKPHOS 116 03/20/2016   BILITOT 0.9 11/22/2014 0305   GFRNONAA >60 11/24/2014 0428   GFRAA >60 11/24/2014 0428   Lab Results  Component Value Date   CHOL 168 03/20/2016   HDL 53 03/20/2016   LDLCALC 90 03/20/2016   TRIG 127 03/20/2016   CHOLHDL 2.7 03/24/2010   Lab Results  Component Value Date   HGBA1C 6.1 03/20/2016   Lab Results  Component Value Date   VITAMINB12 1,623 (H) 04/14/2016   Lab Results  Component Value Date   TSH 2.61 03/20/2016      ASSESSMENT AND PLAN 61 y.o. year old female  has a past medical history of Abnormality of gait (10/02/2015), Anemia, Carpal tunnel syndrome, Depression, Diabetes mellitus, Dyslipidemia, Gastritis, Hemiplegia as late effect of stroke ([place]) (10/02/2015), Hemorrhoids, Hiatal hernia, Hyperlipidemia, Hypertension, Iron deficiency anemia (2008), Memory difficulty (10/15/2016), Osteoporosis, Seizure disorder ([place]), Seizures ([place]), Spastic paraparesis (10/02/2015),  Stroke [facility]) (12 1989), Urinary incontinence, and Uterine fibroid. here with :  1.  Seizures 2.  Memory  disturbance  Overall the patient is doing well.  She will continue on Keppra thousand milligrams twice a day.  Her memory score has remained stable.  I advised that if she has any seizure events she should let us know.  She will follow-up in 6 months or sooner if needed.        Ward Givens, MSN, NP-C 04/14/2017, 9:26 AM [facility] [address] [place], [place] [address] [phone]

## 2017-07-19 ENCOUNTER — Other Ambulatory Visit: Payer: Self-pay | Admitting: Family Medicine

## 2017-07-19 DIAGNOSIS — Z1231 Encounter for screening mammogram for malignant neoplasm of breast: Secondary | ICD-10-CM

## 2017-08-10 ENCOUNTER — Ambulatory Visit: Payer: Medicare Other

## 2017-09-01 ENCOUNTER — Ambulatory Visit
Admission: RE | Admit: 2017-09-01 | Discharge: 2017-09-01 | Disposition: A | Discharge status: Home / Self Care | Payer: Medicare Other | Source: Ambulatory Visit | Attending: Family Medicine | Admitting: Family Medicine

## 2017-09-01 DIAGNOSIS — Z1231 Encounter for screening mammogram for malignant neoplasm of breast: Secondary | ICD-10-CM

## 2017-09-06 ENCOUNTER — Telehealth: Payer: Self-pay | Admitting: Internal Medicine

## 2017-09-06 NOTE — Telephone Encounter (Signed)
Received a call from Fuller Plan, NP from [facility] to schedule a hematology appt for the pt for a dx of thrombocytopenia. Aware the pt should arrive 30 minutes early. Voiced understanding. I provided the new patient fax number to send the pt's labs and office notes.

## 2017-09-13 ENCOUNTER — Encounter: Payer: Self-pay | Admitting: Internal Medicine

## 2017-09-13 ENCOUNTER — Telehealth: Payer: Self-pay | Admitting: Internal Medicine

## 2017-09-13 ENCOUNTER — Inpatient Hospital Stay: Payer: Medicare Other | Attending: Internal Medicine | Admitting: Internal Medicine

## 2017-09-13 ENCOUNTER — Inpatient Hospital Stay: Payer: Medicare Other

## 2017-09-13 ENCOUNTER — Other Ambulatory Visit: Payer: Self-pay | Admitting: Medical Oncology

## 2017-09-13 VITALS — BP 115/91 | HR 63 | Temp 98.6°F | Resp 18 | Ht 67.0 in | Wt 208.0 lb

## 2017-09-13 DIAGNOSIS — D696 Thrombocytopenia, unspecified: Secondary | ICD-10-CM

## 2017-09-13 DIAGNOSIS — E119 Type 2 diabetes mellitus without complications: Secondary | ICD-10-CM | POA: Diagnosis not present

## 2017-09-13 DIAGNOSIS — D649 Anemia, unspecified: Secondary | ICD-10-CM

## 2017-09-13 DIAGNOSIS — I10 Essential (primary) hypertension: Secondary | ICD-10-CM | POA: Diagnosis not present

## 2017-09-13 DIAGNOSIS — D473 Essential (hemorrhagic) thrombocythemia: Secondary | ICD-10-CM | POA: Diagnosis present

## 2017-09-13 DIAGNOSIS — D509 Iron deficiency anemia, unspecified: Secondary | ICD-10-CM

## 2017-09-13 DIAGNOSIS — R5383 Other fatigue: Secondary | ICD-10-CM | POA: Diagnosis not present

## 2017-09-13 LAB — CBC WITH DIFFERENTIAL (CANCER CENTER ONLY)
BASOS PCT: 0 %
Basophils Absolute: 0 10*3/uL (ref 0.0–0.1)
EOS ABS: 0.3 10*3/uL (ref 0.0–0.5)
Eosinophils Relative: 4 %
HCT: 36.7 % (ref 34.8–46.6)
HEMOGLOBIN: 12.4 g/dL (ref 11.6–15.9)
Lymphocytes Relative: 26 %
Lymphs Abs: 2 10*3/uL (ref 0.9–3.3)
MCH: 32 pg (ref 25.1–34.0)
MCHC: 33.8 g/dL (ref 31.5–36.0)
MCV: 94.6 fL (ref 79.5–101.0)
MONOS PCT: 5 %
Monocytes Absolute: 0.3 10*3/uL (ref 0.1–0.9)
NEUTROS PCT: 65 %
Neutro Abs: 4.8 10*3/uL (ref 1.5–6.5)
Platelet Count: 274 10*3/uL (ref 145–400)
RBC: 3.88 MIL/uL (ref 3.70–5.45)
RDW: 12.9 % (ref 11.2–14.5)
WBC: 7.4 10*3/uL (ref 3.9–10.3)

## 2017-09-13 LAB — CMP (CANCER CENTER ONLY)
ALK PHOS: 105 U/L (ref 40–150)
ALT: 25 U/L (ref 0–55)
AST: 15 U/L (ref 5–34)
Albumin: 3.5 g/dL (ref 3.5–5.0)
Anion gap: 7 (ref 3–11)
BILIRUBIN TOTAL: 0.3 mg/dL (ref 0.2–1.2)
BUN: 16 mg/dL (ref 7–26)
CALCIUM: 9.3 mg/dL (ref 8.4–10.4)
CO2: 27 mmol/L (ref 22–29)
CREATININE: 0.75 mg/dL (ref 0.60–1.10)
Chloride: 104 mmol/L (ref 98–109)
Glucose, Bld: 94 mg/dL (ref 70–140)
Potassium: 3.8 mmol/L (ref 3.5–5.1)
Sodium: 138 mmol/L (ref 136–145)
Total Protein: 7.4 g/dL (ref 6.4–8.3)

## 2017-09-13 LAB — PLATELET BY CITRATE

## 2017-09-13 LAB — LACTATE DEHYDROGENASE: LDH: 227 U/L (ref 125–245)

## 2017-09-13 LAB — DIC (DISSEMINATED INTRAVASCULAR COAGULATION) PANEL
FIBRINOGEN: 541 mg/dL — AB (ref 210–475)
PROTHROMBIN TIME: 13.1 s (ref 11.4–15.2)

## 2017-09-13 LAB — DIC (DISSEMINATED INTRAVASCULAR COAGULATION)PANEL
D-Dimer, Quant: 0.37 ug/mL-FEU (ref 0.00–0.50)
INR: 1
Platelets: UNDETERMINED 10*3/uL (ref 150–400)
Smear Review: NONE SEEN
aPTT: 30 seconds (ref 24–36)

## 2017-09-13 LAB — IRON AND TIBC
IRON: 90 ug/dL (ref 41–142)
Saturation Ratios: 40 % (ref 21–57)
TIBC: 226 ug/dL — AB (ref 236–444)
UIBC: 137 ug/dL

## 2017-09-13 LAB — VITAMIN B12: VITAMIN B 12: 998 pg/mL — AB (ref 180–914)

## 2017-09-13 LAB — FOLATE: FOLATE: 28 ng/mL (ref >5.9)

## 2017-09-13 LAB — FERRITIN: Ferritin: 107 ng/mL (ref 9–269)

## 2017-09-13 NOTE — Telephone Encounter (Signed)
Appointments scheduled AVS/Calendar printed per 5/20 los °

## 2017-09-13 NOTE — Progress Notes (Signed)
[place] [phone]   [phone]  CONSULT NOTE  REFERRING PHYSICIAN: Dr. Virgel Bouquet  REASON FOR CONSULTATION:  62 years old African-American female with persistent thrombocytopenia  HPI [name] [name] is a 62 y.o. female with past medical history significant for multiple medical problems including history of hypertension, diabetes mellitus, dyslipidemia, depression, iron deficiency anemia, osteoporosis, history of seizure, coronary artery disease, history of stroke in December 1989.  The patient was seen recently by her primary care physician complaining of lightheadedness for couple of months.  During her evaluation CBC was performed and it showed mild anemia in addition to persistent thrombocytopenia.  The patient mentioned that she had low platelets count for several years.  Reviewing her previous record her platelets count has always been adequate in number but clumped and it was hard to give specific count.  She is feeling fine today with no specific complaints except for the occasional lightheadedness.  She denied having any fever or chills.  She has no nausea, vomiting, diarrhea or constipation.  She has no bleeding, bruises or ecchymosis.  She denied having any bleeding issues on her previous surgical procedure including 2 C-sections.  She indicated that she has bleeding gums with brushing of her teeth.  The patient denied having any chest pain, shortness of breath, cough or hemoptysis.  She has no headache or visual changes. Family history significant for father died in a truck accident and mother died from HIV complications. The patient is divorced and has 2 children his son and daughter.  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  She has no history of alcohol or drug abuse.  HPI  Past Medical History:  Diagnosis Date  . Abnormality of gait 10/02/2015  . Anemia   . Carpal tunnel syndrome   .  Depression   . Diabetes mellitus   . Dyslipidemia   . Gastritis   . Hemiplegia as late effect of stroke ([place]) 10/02/2015  . Hemorrhoids   . Hiatal hernia   . Hyperlipidemia   . Hypertension   . Iron deficiency anemia 2008   negative EGD and colonoscopy  . Memory difficulty 10/15/2016  . Osteoporosis   . Seizure disorder ([place])   . Seizures ([place])   . 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 Stroke ([place]) 12 1989  . Urinary incontinence   . Uterine fibroid     Past Surgical History:  Procedure Laterality Date  . BREAST SURGERY  1977  . CESAREAN SECTION     x2  . COLONOSCOPY  2008  . TRACHEOSTOMY    . TRACHEOSTOMY CLOSURE    . TUBAL LIGATION    . UPPER GASTROINTESTINAL ENDOSCOPY  2008    Family History  Problem Relation Age of Onset  . Heart disease Paternal Grandfather   . Hypertension Paternal Grandfather   . Prostate cancer Paternal Uncle   . Ovarian cancer Mother   . Hypertension Maternal Grandmother   . Stroke Maternal Grandmother   . Diabetes Brother   . Heart attack Brother   . Diabetes Cousin   . Colon cancer Neg Hx   . Breast cancer Neg Hx     Social History Social History   Tobacco Use  . Smoking status: Never Smoker  . Smokeless tobacco: Never Used  Substance Use Topics  . Alcohol use: No  . Drug use: No    Allergies  Allergen Reactions  . Amoxicillin Itching  . Cephalexin  Itching  . Penicillins Itching    Current Outpatient Medications  Medication Sig Dispense Refill  . acetaminophen (TYLENOL) 325 MG tablet Take 650 mg by mouth every 6 (six) hours as needed.    Marland Kitchen amLODipine (NORVASC) 5 MG tablet Take 5 mg by mouth daily.    Marland Kitchen atenolol (TENORMIN) 100 MG tablet Take 100 mg by mouth daily.    Marland Kitchen atorvastatin (LIPITOR) 20 MG tablet Take 20 mg by mouth daily.    . baclofen (LIORESAL) 5 mg TABS tablet Take 5 mg by mouth 3 (three) times daily.     . Dimethicone-Zinc Oxide-Vit A-D (A & D ZINC OXIDE EX) Apply 1 application topically as needed. To buttocks      . ferrous sulfate 325 (65 FE) MG tablet Take 325 mg by mouth daily.     . fluticasone (FLONASE) 50 MCG/ACT nasal spray     . gabapentin (NEURONTIN) 300 MG capsule Take 300 mg by mouth daily.    . hydrochlorothiazide (HYDRODIURIL) 25 MG tablet Take 25 mg by mouth daily.    . Incontinence Supply Disposable MISC by Does not apply route.    . levETIRAcetam (KEPPRA) 1000 MG tablet Take 1,000 mg by mouth 2 (two) times daily. For conversion disorder with seizures or convulsions.    Marland Kitchen lisinopril (PRINIVIL,ZESTRIL) 40 MG tablet Take 40 mg by mouth daily.    Marland Kitchen loratadine (CLARITIN) 10 MG tablet Take 10 mg by mouth every morning. For allergies.    . Melatonin 3 MG TBDP Take by mouth.    . Menthol, Topical Analgesic, (BIOFREEZE) 4 % GEL Apply 1 application topically 2 (two) times daily as needed (Apply to knees).    . Misc. Devices [facility]) MISC by Does not apply route.    . Multiple Vitamin (THERA) TABS Take 1 tablet by mouth daily.    . polyethylene glycol (MIRALAX / GLYCOLAX) packet Take 17 g by mouth every 8 (eight) hours as needed.     . potassium chloride SA (K-DUR,KLOR-CON) 20 MEQ tablet Take 20 mEq by mouth daily.    Marland Kitchen senna (SENOKOT) 8.6 MG tablet Take 2 tablets by mouth at bedtime.     . sodium chloride (OCEAN) 0.65 % SOLN nasal spray Place 2 sprays into both nostrils every 4 (four) hours as needed.    Marland Kitchen aspirin 325 MG tablet Take 325 mg by mouth as needed. For hypertension.    Marland Kitchen guaiFENesin (ROBITUSSIN) 100 MG/5ML SOLN Take 10 mLs by mouth every 4 (four) hours as needed for cough.    . promethazine (PHENERGAN) 25 MG tablet Take 25 mg by mouth every 6 (six) hours as needed for nausea or vomiting.     No current facility-administered medications for this visit.     Review of Systems  Constitutional: positive for fatigue Eyes: negative Ears, nose, mouth, throat, and face: negative Respiratory: negative Cardiovascular: negative Gastrointestinal:  negative Genitourinary:negative Integument/breast: negative Hematologic/lymphatic: negative Musculoskeletal:positive for arthralgias and muscle weakness Neurological: positive for dizziness and gait problems Behavioral/Psych: negative Endocrine: negative Allergic/Immunologic: negative  Physical Exam  ACZ:YSAYT, healthy, no distress, well nourished and well developed SKIN: skin color, texture, turgor are normal, no rashes or significant lesions HEAD: Normocephalic, No masses, lesions, tenderness or abnormalities EYES: normal, PERRLA, Conjunctiva are pink and non-injected EARS: External ears normal, Canals clear OROPHARYNX:no exudate, no erythema and lips, buccal mucosa, and tongue normal  NECK: supple, no adenopathy, no JVD LYMPH:  no palpable lymphadenopathy, no hepatosplenomegaly BREAST:not examined LUNGS: clear to auscultation , and  palpation HEART: regular rate & rhythm, no murmurs and no gallops ABDOMEN:abdomen soft, non-tender, obese, normal bowel sounds and no masses or organomegaly BACK: Back symmetric, no curvature., No CVA tenderness EXTREMITIES:no joint deformities, effusion, or inflammation, no edema  NEURO: alert & oriented x 3 with fluent speech, no focal motor/sensory deficits  PERFORMANCE STATUS: ECOG 2  LABORATORY DATA: Lab Results  Component Value Date   WBC 5.0 05/05/2016   HGB 12.3 05/05/2016   HCT 37 05/05/2016   MCV 95.9 11/24/2014   PLT 0 (A) 08/02/2015      Chemistry      Component Value Date/Time   NA 140 06/26/2016   K 3.2 (A) 06/26/2016   CL 111 11/24/2014 0428   CO2 26 11/24/2014 0428   BUN 13 06/26/2016   CREATININE 0.6 06/26/2016   CREATININE 0.52 11/24/2014 0428   GLU 131 06/26/2016      Component Value Date/Time   CALCIUM 8.8 (L) 11/24/2014 0428   ALKPHOS 116 03/20/2016   AST 14 03/20/2016   ALT 17 03/20/2016   BILITOT 0.9 11/22/2014 0305       RADIOGRAPHIC STUDIES: Mm Digital Screening Bilateral  Result Date:  09/01/2017 CLINICAL DATA:  Screening. EXAM: DIGITAL SCREENING BILATERAL MAMMOGRAM WITH CAD COMPARISON:  Previous exam(s). ACR Breast Density Category b: There are scattered areas of fibroglandular density. FINDINGS: There are no findings suspicious for malignancy. Images were processed with CAD. IMPRESSION: No mammographic evidence of malignancy. A result letter of this screening mammogram will be mailed directly to the patient. RECOMMENDATION: Screening mammogram in one year. (Code:SM-B-01Y) BI-RADS CATEGORY  1: Negative. Electronically Signed   By: Lajean Manes M.D.   On: 09/01/2017 16:25    ASSESSMENT: This is a very pleasant 62 years old African-American female presented for evaluation of persistent thrombocytopenia.  The etiology is unclear but it could be immune mediated or drug-induced.  Her platelets has always been clamped on peripheral blood smear and adequate count could not be confirmed.  The patient has no bleeding issues with her thrombocytopenia. She also has a history of iron deficiency anemia.  PLAN: I had a lengthy discussion with the patient today about her condition and further investigation to confirm her diagnosis. I recommended for the patient to have several studies performed today including repeat CBC and checking platelets count on citrate.  I also order iron studies, ferritin, serum folate, serum vitamin B12 level, DIC panel, von Willebrand panel as well as comprehensive metabolic panel and LDH. I will arrange for the patient to come back for follow-up visit in 2-3 weeks for reevaluation and discussion of her lab results. For the iron deficiency anemia, she will continue on the oral iron tablets for now. The patient was advised to call immediately if she has any concerning symptoms in the interval. The patient voices understanding of current disease status and treatment options and is in agreement with the current care plan.  All questions were answered. The patient knows to  call the clinic with any problems, questions or concerns. We can certainly see the patient much sooner if necessary.  Thank you so much for allowing me to participate in the care of [name] [name]. I will continue to follow up the patient with you and assist in her care.  I spent 40 minutes counseling the patient face to face. The total time spent in the appointment was 60 minutes.  Disclaimer: This note was dictated with voice recognition software. Similar sounding words can inadvertently be transcribed and  may not be corrected upon review.   Eilleen Kempf Sep 13, 2017, 12:02 PM

## 2017-09-14 LAB — RHEUMATOID FACTOR: Rhuematoid fact SerPl-aCnc: <10 IU/mL (ref 0.0–13.9)

## 2017-09-14 LAB — HEPATITIS PANEL, ACUTE
HCV Ab: <0.1 s/co ratio (ref 0.0–0.9)
Hep A IgM: NEGATIVE
Hep B C IgM: NEGATIVE
Hepatitis B Surface Ag: NEGATIVE

## 2017-09-14 LAB — ANTI-DNA ANTIBODY, DOUBLE-STRANDED: ds DNA Ab: <1 IU/mL (ref 0–9)

## 2017-10-04 ENCOUNTER — Encounter: Payer: Self-pay | Admitting: Oncology

## 2017-10-04 ENCOUNTER — Inpatient Hospital Stay (HOSPITAL_BASED_OUTPATIENT_CLINIC_OR_DEPARTMENT_OTHER): Payer: Medicare Other | Admitting: Oncology

## 2017-10-04 ENCOUNTER — Inpatient Hospital Stay: Payer: Medicare Other | Attending: Internal Medicine

## 2017-10-04 DIAGNOSIS — E119 Type 2 diabetes mellitus without complications: Secondary | ICD-10-CM | POA: Insufficient documentation

## 2017-10-04 DIAGNOSIS — D696 Thrombocytopenia, unspecified: Secondary | ICD-10-CM | POA: Insufficient documentation

## 2017-10-04 DIAGNOSIS — I10 Essential (primary) hypertension: Secondary | ICD-10-CM

## 2017-10-04 DIAGNOSIS — D509 Iron deficiency anemia, unspecified: Secondary | ICD-10-CM

## 2017-10-04 LAB — CBC WITH DIFFERENTIAL (CANCER CENTER ONLY)
BASOS ABS: 0 10*3/uL (ref 0.0–0.1)
BASOS PCT: 1 %
Eosinophils Absolute: 0.3 10*3/uL (ref 0.0–0.5)
Eosinophils Relative: 4 %
HCT: 39.2 % (ref 34.8–46.6)
HEMOGLOBIN: 12.9 g/dL (ref 11.6–15.9)
Lymphocytes Relative: 27 %
Lymphs Abs: 2.1 10*3/uL (ref 0.9–3.3)
MCH: 31.5 pg (ref 25.1–34.0)
MCHC: 32.9 g/dL (ref 31.5–36.0)
MCV: 95.8 fL (ref 79.5–101.0)
Monocytes Absolute: 0.5 10*3/uL (ref 0.1–0.9)
Monocytes Relative: 6 %
NEUTROS PCT: 62 %
Neutro Abs: 4.9 10*3/uL (ref 1.5–6.5)
Platelet Count: 103 10*3/uL — ABNORMAL LOW (ref 145–400)
RBC: 4.09 MIL/uL (ref 3.70–5.45)
RDW: 13.1 % (ref 11.2–14.5)
WBC: 7.9 10*3/uL (ref 3.9–10.3)

## 2017-10-04 NOTE — Progress Notes (Signed)
[facility] OFFICE PROGRESS NOTE  Patient, No Pcp Per No address on file  DIAGNOSIS: Persistent thrombocytopenia.  The etiology is unclear but it could be immune mediated or drug-induced.  Her platelets has always been clumped on peripheral blood smear and adequate count could not be confirmed.  The patient has no bleeding issues with her thrombocytopenia. She also has a history of iron deficiency anemia.  PRIOR THERAPY: None  CURRENT THERAPY: None  INTERVAL HISTORY: [name] [name] 62 y.o. female returns for routine follow-up visit by herself.  The patient is feeling fine today and has no specific complaints.  She denies fevers and chills.  Denies chest pain, shortness breath, cough, hemoptysis.  Denies nausea, vomiting, constipation, diarrhea.  Denies bleeding, bruising, ecchymosis.  The patient had recent lab work and is here to discuss the results.  MEDICAL HISTORY: Past Medical History:  Diagnosis Date  . Abnormality of gait 10/02/2015  . Anemia   . Carpal tunnel syndrome   . Depression   . Diabetes mellitus   . Dyslipidemia   . Gastritis   . Hemiplegia as late effect of stroke ([place]) 10/02/2015  . Hemorrhoids   . Hiatal hernia   . Hyperlipidemia   . Hypertension   . Iron deficiency anemia 2008   negative EGD and colonoscopy  . Memory difficulty 10/15/2016  . Osteoporosis   . Seizure disorder ([place])   . Seizures ([place])   . 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 Stroke ([place]) 12 1989  . Urinary incontinence   . Uterine fibroid     ALLERGIES:  is allergic to amoxicillin; cephalexin; and penicillins.  MEDICATIONS:  Current Outpatient Medications  Medication Sig Dispense Refill  . acetaminophen (TYLENOL) 325 MG tablet Take 650 mg by mouth every 6 (six) hours as needed.    Marland Kitchen amLODipine (NORVASC) 5 MG tablet Take 5 mg by mouth daily.    Marland Kitchen aspirin 325 MG tablet Take 81 mg by mouth as needed. For hypertension.    Marland Kitchen atenolol (TENORMIN) 100 MG tablet Take 100 mg by mouth  daily.    Marland Kitchen atorvastatin (LIPITOR) 20 MG tablet Take 20 mg by mouth daily.    . baclofen (LIORESAL) 5 mg TABS tablet Take 5 mg by mouth 3 (three) times daily.     . ferrous sulfate 325 (65 FE) MG tablet Take 325 mg by mouth daily.     . fluticasone (FLONASE) 50 MCG/ACT nasal spray     . gabapentin (NEURONTIN) 300 MG capsule Take 300 mg by mouth daily.    . hydrochlorothiazide (HYDRODIURIL) 25 MG tablet Take 25 mg by mouth daily.    . Incontinence Supply Disposable MISC by Does not apply route.    . levETIRAcetam (KEPPRA) 1000 MG tablet Take 1,000 mg by mouth 2 (two) times daily. For conversion disorder with seizures or convulsions.    Marland Kitchen lisinopril (PRINIVIL,ZESTRIL) 40 MG tablet Take 40 mg by mouth daily.    Marland Kitchen loratadine (CLARITIN) 10 MG tablet Take 10 mg by mouth every morning. For allergies.    . Melatonin 3 MG TBDP Take by mouth.    . Menthol, Topical Analgesic, (BIOFREEZE) 4 % GEL Apply 1 application topically 2 (two) times daily as needed (Apply to knees).    . Misc. Devices [facility]) MISC by Does not apply route.    . Multiple Vitamin (THERA) TABS Take 1 tablet by mouth daily.    . potassium chloride SA (K-DUR,KLOR-CON) 20 MEQ tablet Take 30 mEq by mouth daily.     Marland Kitchen  senna (SENOKOT) 8.6 MG tablet Take 2 tablets by mouth at bedtime.     . Dimethicone-Zinc Oxide-Vit A-D (A & D ZINC OXIDE EX) Apply 1 application topically as needed. To buttocks    . guaiFENesin (ROBITUSSIN) 100 MG/5ML SOLN Take 10 mLs by mouth every 4 (four) hours as needed for cough.    . polyethylene glycol (MIRALAX / GLYCOLAX) packet Take 17 g by mouth every 8 (eight) hours as needed.     . promethazine (PHENERGAN) 25 MG tablet Take 25 mg by mouth every 6 (six) hours as needed for nausea or vomiting.    . sodium chloride (OCEAN) 0.65 % SOLN nasal spray Place 2 sprays into both nostrils every 4 (four) hours as needed.     No current facility-administered medications for this visit.     SURGICAL HISTORY:  Past  Surgical History:  Procedure Laterality Date  . BREAST SURGERY  1977  . CESAREAN SECTION     x2  . COLONOSCOPY  2008  . TRACHEOSTOMY    . TRACHEOSTOMY CLOSURE    . TUBAL LIGATION    . UPPER GASTROINTESTINAL ENDOSCOPY  2008    REVIEW OF SYSTEMS:   Review of Systems  Constitutional: Negative for appetite change, chills, fatigue, fever and unexpected weight change.  HENT:   Negative for mouth sores, nosebleeds, sore throat and trouble swallowing.   Eyes: Negative for eye problems and icterus.  Respiratory: Negative for cough, hemoptysis, shortness of breath and wheezing.   Cardiovascular: Negative for chest pain.  Gastrointestinal: Negative for abdominal pain, constipation, diarrhea, nausea and vomiting.  Genitourinary: Negative for bladder incontinence, difficulty urinating, dysuria, frequency and hematuria.   Musculoskeletal: Negative for back pain, neck pain and neck stiffness. Patient unable to stand. 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 Skin: Negative for itching and rash.  Neurological: Negative for dizziness, headaches, light-headedness.  Hematological: Negative for adenopathy. Does not bruise/bleed easily.  Psychiatric/Behavioral: Negative for confusion, depression and sleep disturbance. The patient is not nervous/anxious.     PHYSICAL EXAMINATION:  Blood pressure 121/76, pulse 62, temperature 97.9 F (36.6 C), temperature source Oral, resp. rate 17, height 5\' 7"  (1.702 m), last menstrual period 06/24/2013, SpO2 97 %.  ECOG PERFORMANCE STATUS: 2 - Symptomatic, <50% confined to bed  Physical Exam  Constitutional: Oriented to person, place, and time. No distress.  HENT:  Head: Normocephalic and atraumatic.  Mouth/Throat: Oropharynx is clear and moist. No oropharyngeal exudate.  Eyes: Conjunctivae are normal. Right eye exhibits no discharge. Left eye exhibits no discharge. No scleral icterus.  Neck: Normal range of motion. Neck supple.  Cardiovascular: Normal rate, regular rhythm,  normal heart sounds and intact distal pulses.   Pulmonary/Chest: Effort normal and breath sounds normal. No respiratory distress. No wheezes. No rales.  Abdominal: Soft. Bowel sounds are normal. Exhibits no distension and no mass. There is no tenderness.  Musculoskeletal: Limited range of motion to the bilateral lower and bilateral upper extremities.  Lymphadenopathy:    No cervical adenopathy.  Neurological: Alert and oriented to person, place, and time. Exhibits normal muscle tone.  Skin: Skin is warm and dry. No rash noted. Not diaphoretic. No erythema. No pallor.  Psychiatric: Mood, memory and judgment normal.  Vitals reviewed.  LABORATORY DATA: Lab Results  Component Value Date   WBC 7.9 10/04/2017   HGB 12.9 10/04/2017   HCT 39.2 10/04/2017   MCV 95.8 10/04/2017   PLT 103 (L) 10/04/2017      Chemistry      Component Value  Date/Time   NA 138 09/13/2017 1302   NA 140 06/26/2016   K 3.8 09/13/2017 1302   CL 104 09/13/2017 1302   CO2 27 09/13/2017 1302   BUN 16 09/13/2017 1302   BUN 13 06/26/2016   CREATININE 0.75 09/13/2017 1302   GLU 131 06/26/2016      Component Value Date/Time   CALCIUM 9.3 09/13/2017 1302   ALKPHOS 105 09/13/2017 1302   AST 15 09/13/2017 1302   ALT 25 09/13/2017 1302   BILITOT 0.3 09/13/2017 1302       RADIOGRAPHIC STUDIES:  No results found.   ASSESSMENT/PLAN:  Thrombocytopenia ([place]) This is a very pleasant 62 year old African-American female presented for evaluation of persistent thrombocytopenia. Her platelets has always been clumped on peripheral blood smear and adequate count could not be confirmed.  The patient has no bleeding issues with her thrombocytopenia. She also has a history of iron deficiency anemia. The patient had recent lab work to confirm her diagnosis and is here to discuss the results and recommendations.  The patient was seen with Dr. Julien Nordmann.  Lab results were discussed with the patient and it was found that her  platelet count is normal when her blood is drawn in a citrate tube.  The platelet count was 224,000 on 09/13/2017.  The CBC was repeated today and her platelet count is low at 103,000 however, the blood was drawn in EDTA tube. There is no underlying cause for her thrombocytopenia found on her lab work.  Recommend that future platelet counts are drawn in citrate to prevent platelet clumping. The patient will continue to follow-up with her primary care provider.  We will see the patient back on as-needed basis.  For the iron deficiency anemia, she will continue on the oral iron tablets for now.  The patient was advised to call immediately if she has any concerning symptoms in the interval. The patient voices understanding of current disease status and treatment options and is in agreement with the current care plan.  All questions were answered. The patient knows to call the clinic with any problems, questions or concerns. We can certainly see the patient much sooner if necessary.  No orders of the defined types were placed in this encounter.  Mikey Bussing, DNP, AGPCNP-BC, AOCNP 10/04/17  ADDENDUM: Hematology/Oncology Attending: I had a face-to-face encounter with the patient today.  I recommended her care plan.  This is a very pleasant 62 years old African-American female who presented for evaluation of persistent thrombocytopenia.  Repeat CBC during her last visit performed on citrate solution showed normal platelets count.  The patient usually have clumped platelets on peripheral blood smear when done with the standard method and tube collection with EDTA. Other studies performed to rule out underlying causes of thrombocytopenia were negative. I discussed the lab results with the patient and recommended for her to continue her routine follow-up visit and evaluation by her primary care physician. For the iron deficiency anemia, she will continue with the oral iron tablets as prescribed by her  PCP. The patient was advised to call immediately if she has any concerning symptoms in the interval.  Disclaimer: This note was dictated with voice recognition software. Similar sounding words can inadvertently be transcribed and may be missed upon review. Eilleen Kempf, MD 10/04/17

## 2017-10-04 NOTE — Assessment & Plan Note (Addendum)
This is a very pleasant 62 year old African-American female presented for evaluation of persistent thrombocytopenia. Her platelets has always been clumped on peripheral blood smear and adequate count could not be confirmed.  The patient has no bleeding issues with her thrombocytopenia. She also has a history of iron deficiency anemia. The patient had recent lab work to confirm her diagnosis and is here to discuss the results and recommendations.  The patient was seen with Dr. Julien Nordmann.  Lab results were discussed with the patient and it was found that her platelet count is normal when her blood is drawn in a citrate tube.  The platelet count was 224,000 on 09/13/2017.  The CBC was repeated today and her platelet count is low at 103,000 however, the blood was drawn in EDTA tube. There is no underlying cause for her thrombocytopenia found on her lab work.  Recommend that future platelet counts are drawn in citrate to prevent platelet clumping. The patient will continue to follow-up with her primary care provider.  We will see the patient back on as-needed basis.  For the iron deficiency anemia, she will continue on the oral iron tablets for now.  The patient was advised to call immediately if she has any concerning symptoms in the interval. The patient voices understanding of current disease status and treatment options and is in agreement with the current care plan.  All questions were answered. The patient knows to call the clinic with any problems, questions or concerns. We can certainly see the patient much sooner if necessary.

## 2017-10-13 ENCOUNTER — Ambulatory Visit: Payer: Medicare Other | Admitting: Adult Health

## 2018-02-28 ENCOUNTER — Encounter

## 2018-02-28 ENCOUNTER — Encounter: Payer: Self-pay | Admitting: Adult Health

## 2018-02-28 ENCOUNTER — Ambulatory Visit (INDEPENDENT_AMBULATORY_CARE_PROVIDER_SITE_OTHER): Payer: Medicare Other | Admitting: Adult Health

## 2018-02-28 VITALS — BP 123/82 | HR 72 | Ht 67.5 in

## 2018-02-28 DIAGNOSIS — R569 Unspecified convulsions: Secondary | ICD-10-CM

## 2018-02-28 NOTE — Patient Instructions (Addendum)
Your Plan:  Continue Keppra 1000 mg twice a day If he had any seizure events please let us know If your symptoms worsen or you develop new symptoms please let us know.   Thank you for coming to see [place] at [facility]. I hope we have been able to provide you high quality care today.  You may receive a patient satisfaction survey over the next few weeks. We would appreciate your feedback and comments so that we may continue to improve ourselves and the health of our patients.

## 2018-02-28 NOTE — Progress Notes (Signed)
I have read the note, and I agree with the clinical assessment and plan.  Charles K Willis   

## 2018-02-28 NOTE — Progress Notes (Signed)
[name]: [name] [name] DOB: 02-03-1956  REASON FOR VISIT: follow up HISTORY FROM: [name]  HISTORY OF PRESENT ILLNESS: Today 02/28/18:  [name] [name] is a 62 year old female with a history of seizures.  She returns today for follow-up.  She remains on Keppra taking 1000 mg twice a day.  She denies any seizure events.  She resides at [facility].  She does note operate a motor vehicle.  She requires assistance with all  ADLs.  Denies any changes in her gait or balance.  No change in her mood or behavior.  She returns today for evaluation.  HISTORY 04/14/17 [name] [name] is a 62 year old female with a history of seizures.  She returns today for follow-up.  She reports that she is doing well.  She continues on Keppra 1000 mg twice a day.  She denies any recent seizure events.  She reports that her memory has remained stable.  She continues to live at [facility].  She does require assistance with ADLs due to mobility issues.  She reports that she is able to feed herself.  Overall she feels that she has done fairly well.  She denies any new symptoms.  She returns today for an evaluation.  REVIEW OF SYSTEMS: Out of a complete 14 system review of symptoms, [name] [name] complains only of [name] following symptoms, and all other reviewed systems are negative.  Incontinence of bladder, constipation, cold, heat intolerance, eye itching, memory loss,  ALLERGIES: Allergies  Allergen Reactions  . Amoxicillin Itching  . Cephalexin Itching  . Penicillins Itching    HOME MEDICATIONS: Outpatient Medications Prior to Visit  Medication Sig Dispense Refill  . acetaminophen (TYLENOL) 325 MG tablet Take 650 mg by mouth every 6 (six) hours as needed.    Marland Kitchen amLODipine (NORVASC) 5 MG tablet Take 5 mg by mouth daily.    Marland Kitchen aspirin 325 MG tablet Take 81 mg by mouth as needed. For hypertension.    Marland Kitchen atenolol (TENORMIN) 100 MG tablet Take 100 mg by mouth daily.    Marland Kitchen atorvastatin (LIPITOR) 20 MG tablet Take 20 mg by  mouth daily.    . baclofen (LIORESAL) 5 mg TABS tablet Take 5 mg by mouth 3 (three) times daily.     . Dimethicone-Zinc Oxide-Vit A-D (A & D ZINC OXIDE EX) Apply 1 application topically as needed. To buttocks    . ferrous sulfate 325 (65 FE) MG tablet Take 325 mg by mouth daily.     . fluticasone (FLONASE) 50 MCG/ACT nasal spray     . gabapentin (NEURONTIN) 300 MG capsule Take 300 mg by mouth daily.    Marland Kitchen guaiFENesin (ROBITUSSIN) 100 MG/5ML SOLN Take 10 mLs by mouth every 4 (four) hours as needed for cough.    . hydrochlorothiazide (HYDRODIURIL) 25 MG tablet Take 25 mg by mouth daily.    . Incontinence Supply Disposable MISC by Does not apply route.    . levETIRAcetam (KEPPRA) 1000 MG tablet Take 1,000 mg by mouth 2 (two) times daily. For conversion disorder with seizures or convulsions.    Marland Kitchen lisinopril (PRINIVIL,ZESTRIL) 40 MG tablet Take 40 mg by mouth daily.    Marland Kitchen loratadine (CLARITIN) 10 MG tablet Take 10 mg by mouth every morning. For allergies.    . Melatonin 3 MG TBDP Take by mouth.    . Menthol, Topical Analgesic, (BIOFREEZE) 4 % GEL Apply 1 application topically 2 (two) times daily as needed (Apply to knees).    . Misc. Devices [facility]) MISC by  Does not apply route.    . Multiple Vitamin (THERA) TABS Take 1 tablet by mouth daily.    . polyethylene glycol (MIRALAX / GLYCOLAX) packet Take 17 g by mouth every 8 (eight) hours as needed.     . potassium chloride SA (K-DUR,KLOR-CON) 20 MEQ tablet Take 30 mEq by mouth daily.     . promethazine (PHENERGAN) 25 MG tablet Take 25 mg by mouth every 6 (six) hours as needed for nausea or vomiting.    . senna (SENOKOT) 8.6 MG tablet Take 2 tablets by mouth at bedtime.     . sodium chloride (OCEAN) 0.65 % SOLN nasal spray Place 2 sprays into both nostrils every 4 (four) hours as needed.     No facility-administered medications prior to visit.     PAST MEDICAL HISTORY: Past Medical History:  Diagnosis Date  . Abnormality of gait 10/02/2015  .  Anemia   . Carpal tunnel syndrome   . Depression   . Diabetes mellitus   . Dyslipidemia   . Gastritis   . Hemiplegia as late effect of stroke ([place]) 10/02/2015  . Hemorrhoids   . Hiatal hernia   . Hyperlipidemia   . Hypertension   . Iron deficiency anemia 2008   negative EGD and colonoscopy  . Memory difficulty 10/15/2016  . Osteoporosis   . Seizure disorder ([place])   . Seizures ([employer])   . 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 Stroke ([place]) 12 1989  . Urinary incontinence   . Uterine fibroid     PAST SURGICAL HISTORY: Past Surgical History:  Procedure Laterality Date  . BREAST SURGERY  1977  . CESAREAN SECTION     x2  . COLONOSCOPY  2008  . TRACHEOSTOMY    . TRACHEOSTOMY CLOSURE    . TUBAL LIGATION    . UPPER GASTROINTESTINAL ENDOSCOPY  2008    FAMILY HISTORY: Family History  Problem Relation Age of Onset  . Heart disease Paternal Grandfather   . Hypertension Paternal Grandfather   . Prostate cancer Paternal Uncle   . Ovarian cancer Mother   . Hypertension Maternal Grandmother   . Stroke Maternal Grandmother   . Diabetes Brother   . Heart attack Brother   . Diabetes Cousin   . Colon cancer Neg Hx   . Breast cancer Neg Hx     SOCIAL HISTORY: Social History   Socioeconomic History  . Marital status: Divorced    Spouse name: Not on file  . Number of children: 2  . Years of education: Not on file  . Highest education level: Not on file  Occupational History  . Occupation: Disabled  Social Needs  . Financial resource strain: Not on file  . Food insecurity:    Worry: Not on file    Inability: Not on file  . Transportation needs:    Medical: Not on file    Non-medical: Not on file  Tobacco Use  . Smoking status: Never Smoker  . Smokeless tobacco: Never Used  Substance and Sexual Activity  . Alcohol use: No  . Drug use: No  . Sexual activity: Not on file  Lifestyle  . Physical activity:    Days per week: Not on file    Minutes per session: Not on file  .  Stress: Not on file  Relationships  . Social connections:    Talks on phone: Not on file    Gets together: Not on file    Attends religious service: Not on file  Active member of club or organization: Not on file    Attends meetings of clubs or organizations: Not on file    Relationship status: Not on file  . Intimate partner violence:    Fear of current or ex partner: Not on file    Emotionally abused: Not on file    Physically abused: Not on file    Forced sexual activity: Not on file  Other Topics Concern  . Not on file  Social History Narrative   Resides at skilled nursing facility secondary to multiple previous strokes and lack of family to care for her at home   Right-handed   Caffeine: 4 cups of coffee per day      PHYSICAL EXAM  Vitals:   02/28/18 0724  BP: 123/82  Pulse: 72  Height: 5' 7.5" (1.715 m)   Body mass index is 32.1 kg/m.  Generalized: Well developed, in no acute distress   Neurological examination  Mentation: Alert oriented to time, place, history taking. Follows all commands speech and language fluent Cranial nerve II-XII: Pupils were equal round reactive to light. Extraocular movements were full, visual field were full on confrontational test. Facial sensation and strength were normal. Uvula tongue midline. Head turning and shoulder shrug  were normal and symmetric. Motor: [name] motor testing reveals 5 over 5 strength in [name] upper extremities.  No movement in [name] lower extremities. Sensory: Sensory testing is intact to soft touch on all 4 extremities. No evidence of extinction is noted.  Coordination: Cerebellar testing reveals good finger-nose-finger bilaterally.  Unable to complete heel-to-shin Gait and station: [name] is in a wheelchair   DIAGNOSTIC DATA (LABS, IMAGING, TESTING) - I reviewed [name] records, labs, notes, testing and imaging myself where available.  Lab Results  Component Value Date   WBC 7.9 10/04/2017   HGB 12.9  10/04/2017   HCT 39.2 10/04/2017   MCV 95.8 10/04/2017   PLT 103 (L) 10/04/2017      Component Value Date/Time   NA 138 09/13/2017 1302   NA 140 06/26/2016   K 3.8 09/13/2017 1302   CL 104 09/13/2017 1302   CO2 27 09/13/2017 1302   GLUCOSE 94 09/13/2017 1302   BUN 16 09/13/2017 1302   BUN 13 06/26/2016   CREATININE 0.75 09/13/2017 1302   CALCIUM 9.3 09/13/2017 1302   PROT 7.4 09/13/2017 1302   ALBUMIN 3.5 09/13/2017 1302   AST 15 09/13/2017 1302   ALT 25 09/13/2017 1302   ALKPHOS 105 09/13/2017 1302   BILITOT 0.3 09/13/2017 1302   GFRNONAA >60 09/13/2017 1302   GFRAA >60 09/13/2017 1302   Lab Results  Component Value Date   CHOL 168 03/20/2016   HDL 53 03/20/2016   LDLCALC 90 03/20/2016   TRIG 127 03/20/2016   CHOLHDL 2.7 03/24/2010   Lab Results  Component Value Date   HGBA1C 6.1 03/20/2016   Lab Results  Component Value Date   VITAMINB12 998 (H) 09/13/2017   Lab Results  Component Value Date   TSH 2.61 03/20/2016      ASSESSMENT AND PLAN 62 y.o. year old female  has a past medical history of Abnormality of gait (10/02/2015), Anemia, Carpal tunnel syndrome, Depression, Diabetes mellitus, Dyslipidemia, Gastritis, Hemiplegia as late effect of stroke ([place]) (10/02/2015), Hemorrhoids, Hiatal hernia, Hyperlipidemia, Hypertension, Iron deficiency anemia (2008), Memory difficulty (10/15/2016), Osteoporosis, Seizure disorder ([place]), Seizures ([place]), Spastic paraparesis (10/02/2015), Stroke ([place]) (12 1989), Urinary incontinence, and Uterine fibroid. here with:  1.  Seizures  Overall [name] [name]  is doing well.  She will continue on Keppra 1000 mg twice a day.  She is advised that if she has any seizure events she should let us know.  She will follow-up in 6 months or sooner if needed.   I spent 15 minutes with [name] [name]. 50% of this time was spent reviewing her plan of care   Ward Givens, MSN, NP-C 02/28/2018, 7:35 AM [facility] [address] [place], [place] [address] [phone]

## 2018-08-05 ENCOUNTER — Other Ambulatory Visit: Payer: Self-pay | Admitting: Family Medicine

## 2018-08-05 DIAGNOSIS — Z1231 Encounter for screening mammogram for malignant neoplasm of breast: Secondary | ICD-10-CM

## 2018-08-08 ENCOUNTER — Other Ambulatory Visit: Payer: Self-pay

## 2018-08-08 NOTE — Patient Outreach (Signed)
[facility]) Care Management  08/08/2018  [name] [name] 1955/12/31 [id number]   Medication Adherence call to [name] is at rehab at this time they are providing with medication. [name] is showing past due on Atorvastatin 20 mg under [place].    [facility] Direct Dial [phone]  Fax [phone] [email or web address]

## 2018-10-05 ENCOUNTER — Ambulatory Visit: Payer: Medicare Other

## 2018-12-23 ENCOUNTER — Other Ambulatory Visit: Payer: Self-pay

## 2018-12-23 ENCOUNTER — Ambulatory Visit
Admission: RE | Admit: 2018-12-23 | Discharge: 2018-12-23 | Disposition: A | Discharge status: Home / Self Care | Payer: Medicare Other | Source: Ambulatory Visit | Attending: Family Medicine | Admitting: Family Medicine

## 2018-12-23 DIAGNOSIS — Z1231 Encounter for screening mammogram for malignant neoplasm of breast: Secondary | ICD-10-CM

## 2019-02-20 ENCOUNTER — Telehealth: Payer: Self-pay | Admitting: Adult Health

## 2019-02-20 NOTE — Telephone Encounter (Signed)
I called [facility] regarding rescheduling patient's 11/5 appt due to unavailable scheduled time of 730 with [facility] NP. I spoke with front office who stated the appt scheduler is not in office today, but she stated she would send an e-mail requesting she call our office to reschedule patient's appt.

## 2019-02-22 NOTE — Telephone Encounter (Signed)
I called [facility] again today and spoke with front office again. I asked for appt scheduler. [place] office states that appt scheduler does not have an extension but they will follow-up on the e-mail they sent regarding patient on 10/26 to reschedule appt.

## 2019-03-02 ENCOUNTER — Ambulatory Visit: Payer: Medicare Other | Admitting: Adult Health

## 2019-05-08 ENCOUNTER — Other Ambulatory Visit: Payer: Self-pay | Admitting: Family Medicine

## 2019-05-08 DIAGNOSIS — Z1231 Encounter for screening mammogram for malignant neoplasm of breast: Secondary | ICD-10-CM

## 2019-12-25 ENCOUNTER — Ambulatory Visit
Admission: RE | Admit: 2019-12-25 | Discharge: 2019-12-25 | Disposition: A | Discharge status: Home / Self Care | Payer: Medicare Other | Source: Ambulatory Visit | Attending: Family Medicine | Admitting: Family Medicine

## 2019-12-25 ENCOUNTER — Other Ambulatory Visit: Payer: Self-pay

## 2019-12-25 DIAGNOSIS — Z1231 Encounter for screening mammogram for malignant neoplasm of breast: Secondary | ICD-10-CM

## 2020-11-28 ENCOUNTER — Other Ambulatory Visit: Payer: Self-pay | Admitting: Family Medicine

## 2020-11-28 DIAGNOSIS — Z1231 Encounter for screening mammogram for malignant neoplasm of breast: Secondary | ICD-10-CM

## 2021-01-17 ENCOUNTER — Ambulatory Visit
Admission: RE | Admit: 2021-01-17 | Discharge: 2021-01-17 | Disposition: A | Discharge status: Home / Self Care | Payer: Medicare Other | Source: Ambulatory Visit | Attending: Family Medicine | Admitting: Family Medicine

## 2021-01-17 ENCOUNTER — Other Ambulatory Visit: Payer: Self-pay

## 2021-01-17 DIAGNOSIS — Z1231 Encounter for screening mammogram for malignant neoplasm of breast: Secondary | ICD-10-CM

## 2021-12-22 ENCOUNTER — Other Ambulatory Visit: Payer: Self-pay | Admitting: Family Medicine

## 2021-12-22 DIAGNOSIS — Z1231 Encounter for screening mammogram for malignant neoplasm of breast: Secondary | ICD-10-CM

## 2022-01-27 ENCOUNTER — Ambulatory Visit
Admission: RE | Admit: 2022-01-27 | Discharge: 2022-01-27 | Disposition: A | Discharge status: Home / Self Care | Payer: Commercial Managed Care - HMO | Source: Ambulatory Visit | Attending: Family Medicine | Admitting: Family Medicine

## 2022-01-27 DIAGNOSIS — Z1231 Encounter for screening mammogram for malignant neoplasm of breast: Secondary | ICD-10-CM

## 2022-05-12 ENCOUNTER — Other Ambulatory Visit: Payer: Self-pay | Admitting: Family Medicine

## 2022-05-12 DIAGNOSIS — Z1231 Encounter for screening mammogram for malignant neoplasm of breast: Secondary | ICD-10-CM

## 2022-07-02 ENCOUNTER — Ambulatory Visit
Admission: RE | Admit: 2022-07-02 | Discharge: 2022-07-02 | Disposition: A | Discharge status: Home / Self Care | Payer: Medicare Other | Source: Ambulatory Visit | Attending: Family Medicine | Admitting: Family Medicine

## 2022-07-02 DIAGNOSIS — Z1231 Encounter for screening mammogram for malignant neoplasm of breast: Secondary | ICD-10-CM

## 2023-05-28 IMAGING — MG MM DIGITAL SCREENING BILAT W/ TOMO AND CAD
8 series · 8 of 24 positions shown · non-contrast
Comparison: Previous exam(s).

CLINICAL DATA: Screening.

EXAM:
DIGITAL SCREENING BILATERAL MAMMOGRAM WITH TOMOSYNTHESIS AND CAD
TECHNIQUE: Bilateral screening digital craniocaudal and mediolateral oblique
mammograms were obtained. Bilateral screening digital breast
tomosynthesis was performed. The images were evaluated with
computer-aided detection.

[R CC synth-2D]
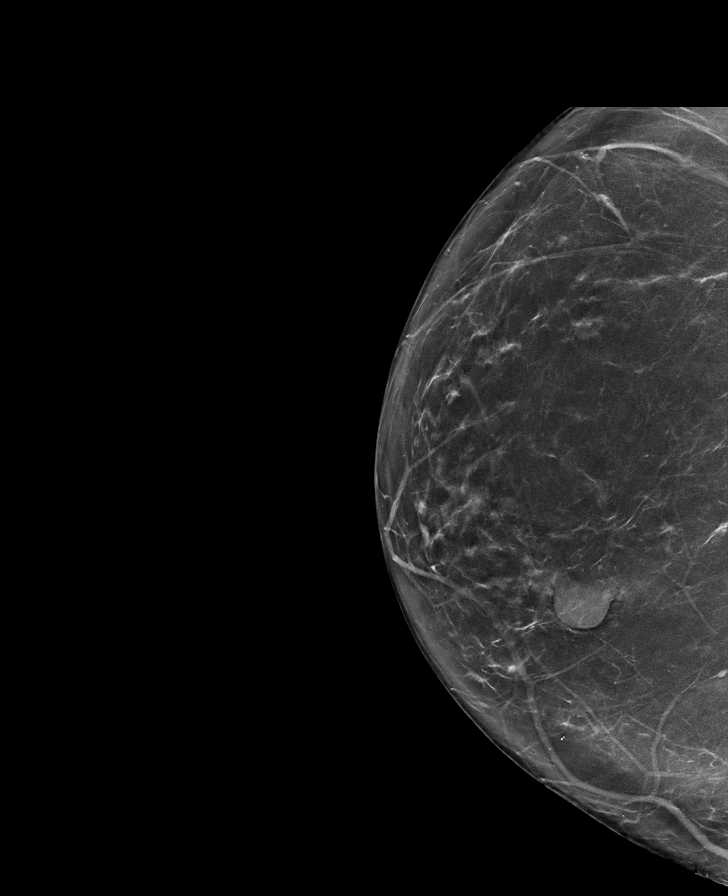

[R MLO synth-2D]
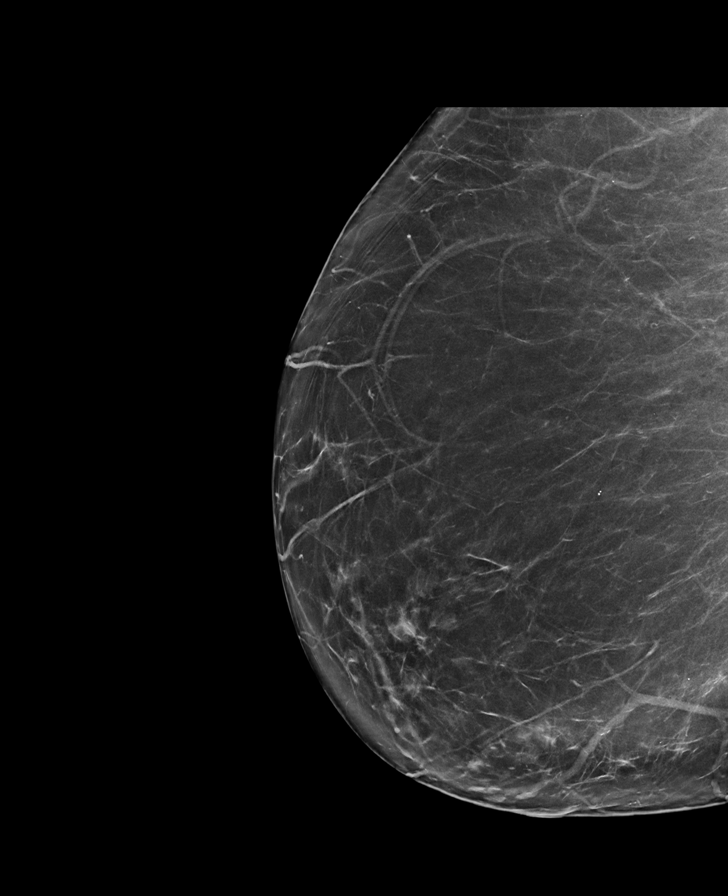

[L MLO synth-2D]
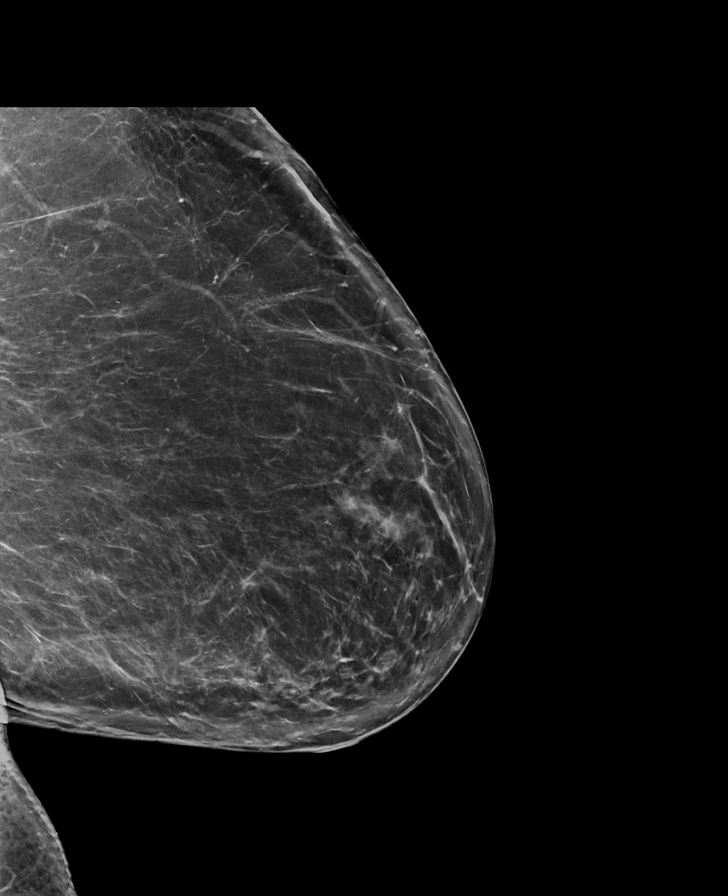

[L CC synth-2D]
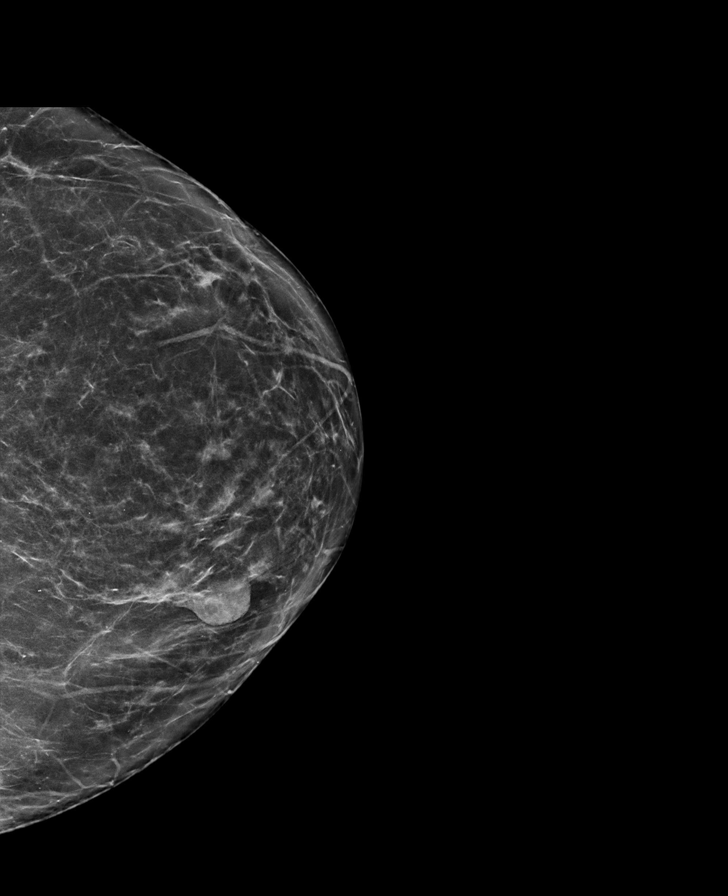

[L CC tomo · tomo slice 36/71.0]
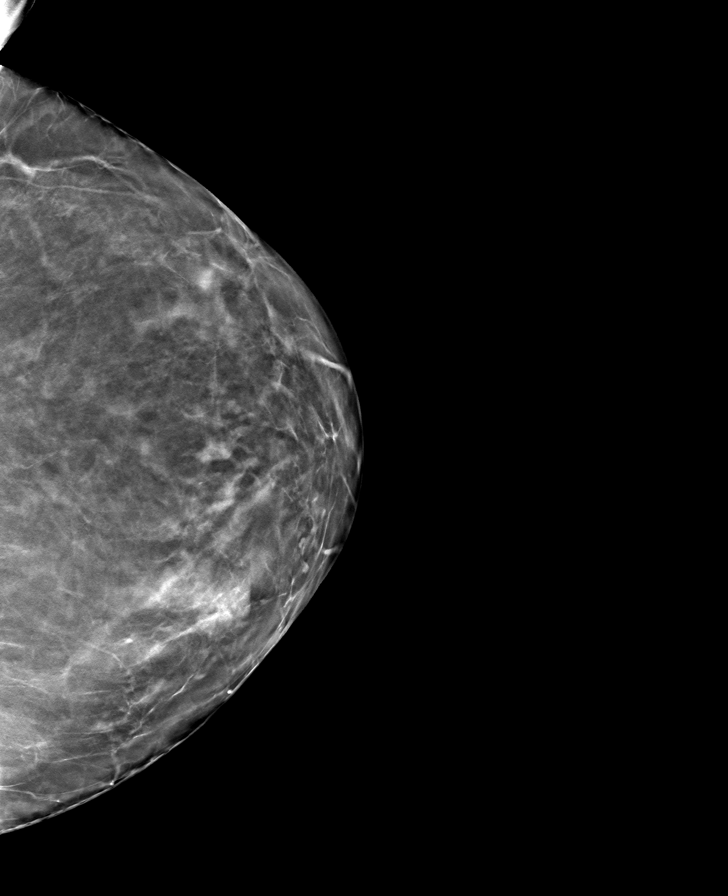

[R MLO tomo · tomo slice 40/79.0]
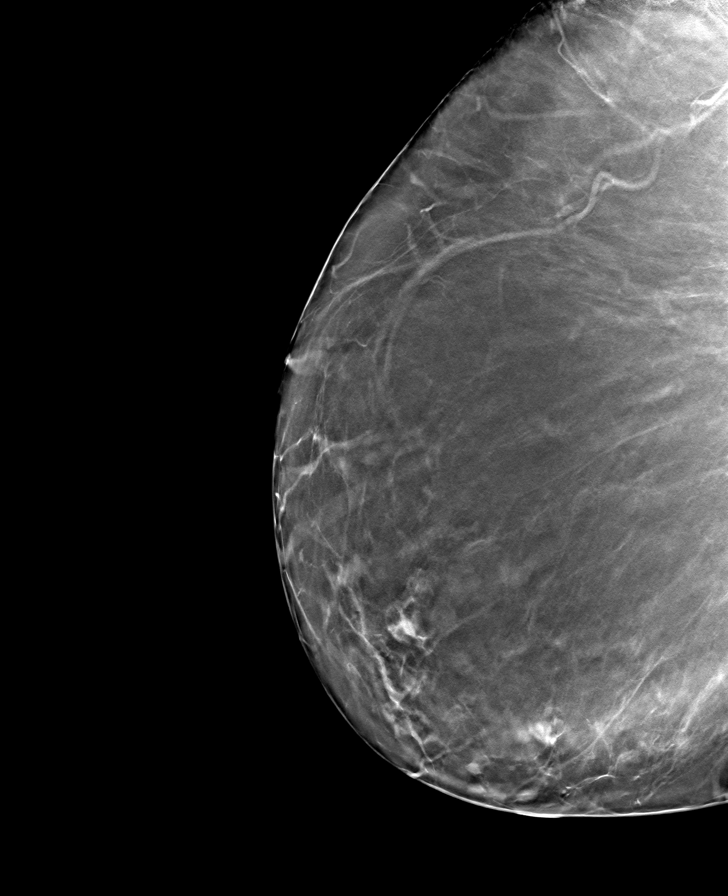

[L MLO tomo · tomo slice 45/89.0]
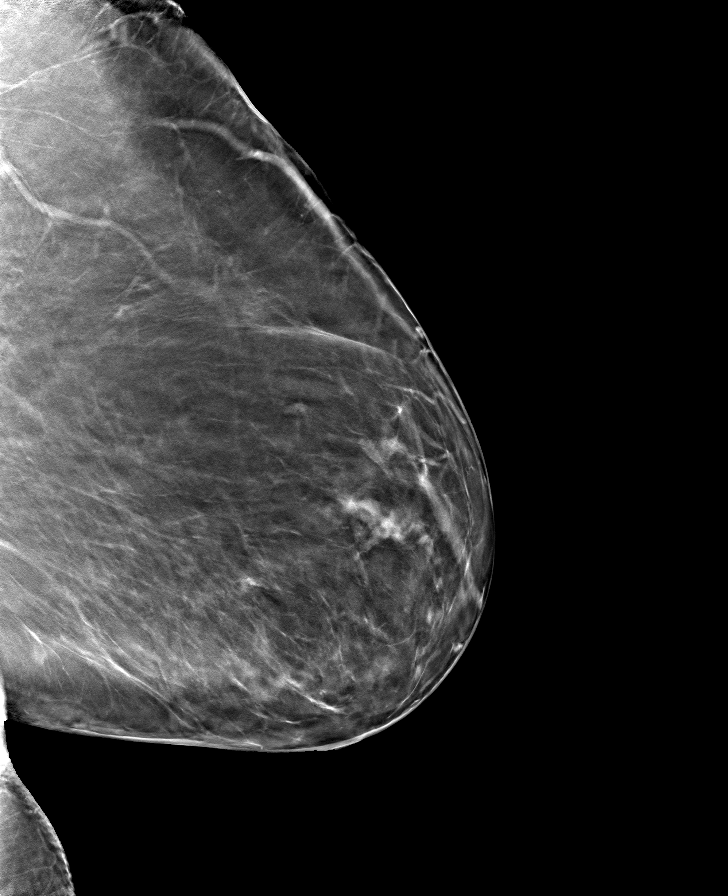

[R CC tomo · tomo slice 39/77.0]
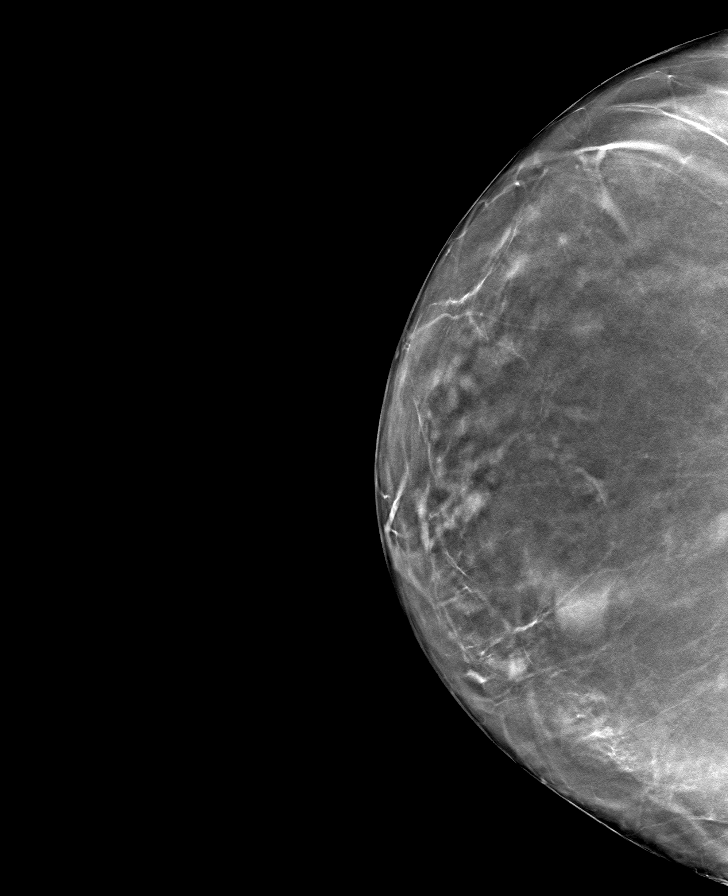

[8 of 24 positions shown; findings below may reference images not displayed]

ACR Breast Density Category b: There are scattered areas of
fibroglandular density.
FINDINGS: There are no findings suspicious for malignancy.
IMPRESSION: No mammographic evidence of malignancy. A result letter of this
screening mammogram will be mailed directly to the patient.

RECOMMENDATION:
Screening mammogram in one year. (Code:51-O-LD2)

BI-RADS CATEGORY  1: Negative.

## 2023-06-16 ENCOUNTER — Other Ambulatory Visit: Payer: Self-pay | Admitting: Internal Medicine

## 2023-06-16 DIAGNOSIS — Z1231 Encounter for screening mammogram for malignant neoplasm of breast: Secondary | ICD-10-CM

## 2023-07-07 ENCOUNTER — Other Ambulatory Visit (HOSPITAL_BASED_OUTPATIENT_CLINIC_OR_DEPARTMENT_OTHER)

## 2023-07-07 ENCOUNTER — Ambulatory Visit (HOSPITAL_BASED_OUTPATIENT_CLINIC_OR_DEPARTMENT_OTHER): Payer: Medicare Other | Admitting: Obstetrics & Gynecology

## 2023-07-07 ENCOUNTER — Ambulatory Visit: Payer: Medicare Other

## 2023-07-07 ENCOUNTER — Encounter (HOSPITAL_BASED_OUTPATIENT_CLINIC_OR_DEPARTMENT_OTHER): Payer: Self-pay | Admitting: Obstetrics & Gynecology

## 2023-07-07 VITALS — BP 131/71 | HR 72 | Ht 67.0 in | Wt 220.0 lb

## 2023-07-07 DIAGNOSIS — N950 Postmenopausal bleeding: Secondary | ICD-10-CM

## 2023-07-07 DIAGNOSIS — Z124 Encounter for screening for malignant neoplasm of cervix: Secondary | ICD-10-CM

## 2023-07-07 NOTE — Addendum Note (Signed)
 Addended by: Ina Homes B on: 07/07/2023 11:06 AM   Modules accepted: Orders

## 2023-07-07 NOTE — Progress Notes (Signed)
 GYNECOLOGY  VISIT  CC:   PMP bleeding  HPI: 68 y.o. G10P2000 Divorced Black or [place] American female here for evaluation of PMP bleeding that started about three weeks ago.  Was reddish and noticed with wiping after voiding.  Has never had this before.  Was never on hormonal therapy.  Has hx of fibroids.    Bedside ultrasound done today transabdominally.  Multiple fibroids noted.  Uterus is enlarged.  Endometirum 1.0cm with echogenic material present.  No blood flow present.  Bilateral ovaries not seen.  No free fluid in PCDS.    Due to endometrial thickness, will need to try and obtain and endometrial biopsy.  Pt willing to try today.   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 Swallow whole.     atenolol (TENORMIN) 100 MG tablet Take 100 mg by mouth daily. (Patient taking differently: Take 50 mg by mouth daily.)     atorvastatin (LIPITOR) 20 MG tablet Take 20 mg by mouth daily.     gabapentin (NEURONTIN) 300 MG capsule Take 300 mg by mouth daily. (Patient taking differently: Take 100 mg by mouth at bedtime.)     levETIRAcetam (KEPPRA) 1000 MG tablet Take 1,000 mg by mouth 2 (two) times daily. For conversion disorder with seizures or convulsions.     Menthol, Topical Analgesic, (BIOFREEZE) 4 % GEL Apply 1 application topically 2 (two)  times daily as needed (Apply to knees).     senna (SENOKOT) 8.6 MG tablet Take 2 tablets by mouth at bedtime.      acetaminophen (TYLENOL) 325 MG tablet Take 650 mg by mouth every 6 (six) hours as needed.     amLODipine (NORVASC) 5 MG tablet Take 5 mg by mouth daily.     baclofen (LIORESAL) 5 mg TABS tablet Take 5 mg by mouth 3 (three) times daily.      Dimethicone-Zinc Oxide-Vit A-D (A & D ZINC OXIDE EX) Apply 1 application topically as needed. To buttocks     ferrous sulfate 325 (65 FE) MG tablet Take 325 mg by mouth daily.      fluticasone (FLONASE) 50 MCG/ACT nasal spray      guaiFENesin (ROBITUSSIN) 100 MG/5ML SOLN Take 10 mLs by mouth every 4 (four) hours as needed for cough.     hydrochlorothiazide (HYDRODIURIL) 25 MG tablet Take 25 mg by mouth daily.     Incontinence Supply Disposable MISC by Does not apply route.     lisinopril (PRINIVIL,ZESTRIL) 40 MG tablet Take 40 mg by mouth daily.     loratadine (CLARITIN) 10 MG tablet Take 10 mg by mouth every morning. For allergies.     Melatonin 3 MG  TBDP Take by mouth.     Misc. Devices [facility]) MISC by Does not apply route.     Multiple Vitamin (THERA) TABS Take 1 tablet by mouth daily.     polyethylene glycol (MIRALAX / GLYCOLAX) packet Take 17 g by mouth every 8 (eight) hours as needed.      potassium chloride SA (K-DUR,KLOR-CON) 20 MEQ tablet Take 30 mEq by mouth daily.      promethazine (PHENERGAN) 25 MG tablet Take 25 mg by mouth every 6 (six) hours as needed for nausea or vomiting.     sodium chloride (OCEAN) 0.65 % SOLN nasal spray Place 2 sprays into both nostrils every 4 (four) hours as needed.     No current facility-administered medications on file prior to visit.    ALLERGIES: Amoxicillin, Cephalexin, and Penicillins  SH:  divorced, non smoker  Review of Systems  Constitutional: Negative.   Genitourinary:        PMP bleeding    PHYSICAL EXAMINATION:    BP 131/71 (BP Location: Right Arm, Patient Position: Supine,  Cuff Size: Large)   Pulse 72   Ht 5\' 7"  (1.702 m) Comment: Reported  Wt 220 lb (99.8 kg)   LMP 06/24/2013   BMI 34.46 kg/m     General appearance: alert, cooperative and appears stated age Abdomen: soft, non-tender; bowel sounds normal; no masses,  no organomegaly Lymph:  no inguinal LAD noted  Pelvic: External genitalia:  no lesions              Urethra:  normal appearing urethra with no masses, tenderness or lesions              Bartholins and Skenes: normal                 Vagina: normal mucosa without prolapse or lesions              Cervix:  can not visualized, very high in vagina              Bimanual Exam:  Uterus:   difficult to feel on physical exam due to large amount of stool              Adnexa: no mass, fullness, tenderness  Chaperone, Raechel Ache, RN, was present for exam.  Assessment/Plan: 1. Postmenopausal bleeding (Primary) - cannot visualize cervix today due to stool in rectum.  Also cannot feel cervix on physical exam.  Will need to try and obtain tissue or consider hysteroscopy and possibly in the OR setting.  Will need to communicate with nursing/administration staff and medical provider about scheduling.

## 2023-07-09 ENCOUNTER — Ambulatory Visit
Admission: RE | Admit: 2023-07-09 | Discharge: 2023-07-09 | Disposition: A | Discharge status: Home / Self Care | Payer: Medicare Other | Source: Ambulatory Visit | Attending: Internal Medicine | Admitting: Internal Medicine

## 2023-07-09 DIAGNOSIS — Z1231 Encounter for screening mammogram for malignant neoplasm of breast: Secondary | ICD-10-CM

## 2023-08-06 ENCOUNTER — Encounter (HOSPITAL_BASED_OUTPATIENT_CLINIC_OR_DEPARTMENT_OTHER): Payer: Medicare Other | Admitting: Obstetrics & Gynecology

## 2023-09-01 ENCOUNTER — Other Ambulatory Visit (HOSPITAL_BASED_OUTPATIENT_CLINIC_OR_DEPARTMENT_OTHER): Payer: Self-pay | Admitting: Obstetrics & Gynecology

## 2023-09-01 DIAGNOSIS — D219 Benign neoplasm of connective and other soft tissue, unspecified: Secondary | ICD-10-CM

## 2023-09-01 DIAGNOSIS — N950 Postmenopausal bleeding: Secondary | ICD-10-CM

## 2023-09-08 ENCOUNTER — Ambulatory Visit (HOSPITAL_BASED_OUTPATIENT_CLINIC_OR_DEPARTMENT_OTHER): Admitting: Obstetrics & Gynecology

## 2023-09-08 ENCOUNTER — Encounter (HOSPITAL_BASED_OUTPATIENT_CLINIC_OR_DEPARTMENT_OTHER): Payer: Self-pay | Admitting: Obstetrics & Gynecology

## 2023-09-08 ENCOUNTER — Other Ambulatory Visit (HOSPITAL_BASED_OUTPATIENT_CLINIC_OR_DEPARTMENT_OTHER): Payer: Self-pay | Admitting: Obstetrics & Gynecology

## 2023-09-08 ENCOUNTER — Ambulatory Visit (HOSPITAL_BASED_OUTPATIENT_CLINIC_OR_DEPARTMENT_OTHER)

## 2023-09-08 VITALS — BP 119/61 | HR 66 | Ht 67.0 in

## 2023-09-08 DIAGNOSIS — N950 Postmenopausal bleeding: Secondary | ICD-10-CM

## 2023-09-08 DIAGNOSIS — D219 Benign neoplasm of connective and other soft tissue, unspecified: Secondary | ICD-10-CM | POA: Diagnosis not present

## 2023-09-08 DIAGNOSIS — R935 Abnormal findings on diagnostic imaging of other abdominal regions, including retroperitoneum: Secondary | ICD-10-CM | POA: Diagnosis not present

## 2023-09-08 NOTE — Progress Notes (Signed)
 GYNECOLOGY  VISIT  CC:   PMP bleeding, repeat ultrasound  HPI: 68 y.o. G2P2000 Divorced Black or [place] American female here for possible endometrial biopsy and repeat ultrasound after receiving an enema yesterday at her assisted living facility.  She was seen on 31/05/2023 for ultrasound and attempted endometrial biopsy.  Her rectal vault was full of firm stool making ultrasound difficult as well as vaginal exam.  Discussed repeating procedure after enema use vs proceeding with hysteroscopy and D&C.  Pt desired to try again and is here today for this.  Ultrasound performed today and visualization is much improved.  Endometrium is thickened to 1.3cm and there are are cystic spaces noted.  Endometrium is avascular.  This is most consistent with endometrial polyp.  Stable uterine fibroids noted.  Ovaries non visualized.     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 Swallow whole.     atenolol  (TENORMIN ) 100 MG tablet Take 100 mg by mouth daily. (Patient taking differently: Take 50 mg by mouth daily.)     atorvastatin  (LIPITOR) 20 MG tablet Take 20 mg by mouth daily.     calcium  carbonate (OS-CAL) 1250 (500 Ca) MG chewable tablet Chew 1 tablet by mouth daily.     Cholecalciferol 25 MCG (1000 UT) CHEW Chew 25 each by mouth daily.     gabapentin (NEURONTIN) 300 MG capsule Take 300 mg by  mouth daily. (Patient taking differently: Take 100 mg by mouth at bedtime.)     levETIRAcetam  (KEPPRA ) 1000 MG tablet Take 1,000 mg by mouth 2 (two) times daily. For conversion disorder with seizures or convulsions.     lidocaine 4 % dressing Apply topically.     lisinopril -hydrochlorothiazide (ZESTORETIC) 20-12.5 MG tablet Take 2 tablets by mouth daily.     loratadine (CLARITIN) 10 MG tablet Take 10 mg by mouth every morning. For allergies.     Menthol, Topical Analgesic, (BIOFREEZE) 4 % GEL Apply 1 application topically 2 (two) times daily as needed (Apply to knees).     mineral oil-hydrophilic petrolatum  (AQUAPHOR) ointment Apply topically as needed for dry skin.     Misc. Devices [facility]) MISC by Does not apply route.     Multiple Vitamins-Minerals (CERTAVITE SENIOR/ANTIOXIDANT) TABS Take 18-400 mg by mouth daily.     Olopatadine HCl (PATADAY) 0.2 % SOLN Apply to eye.     polyethylene glycol (MIRALAX  / GLYCOLAX ) packet Take 17 g by mouth every 8 (eight) hours as needed.      potassium chloride  SA (K-DUR,KLOR-CON ) 20 MEQ tablet Take 20 mEq by mouth daily.     PROBIOTIC, LACTOBACILLUS, PO Take by mouth.     senna (SENOKOT) 8.6 MG tablet Take 2 tablets by mouth at bedtime.  tirzepatide (MOUNJARO) 2.5 MG/0.5ML Pen Inject 2.5 mg into the skin once a week.     No current facility-administered medications on file prior to visit.    ALLERGIES: Amoxicillin, Cephalexin, and Penicillins  SH:  divorced, non smoker  Review of Systems  Constitutional: Negative.   Genitourinary:        No recent vaginal bleeding +urinary incontinence    PHYSICAL EXAMINATION:    LMP 06/24/2013      Physical Exam Exam conducted with a chaperone present.  Constitutional:      Appearance: Normal appearance.  Abdominal:     Hernia: There is no hernia in the left inguinal area or right inguinal area.  Genitourinary:    General: Normal vulva.     Labia:        Right: No lesion.        Left: No  lesion.      Vagina: Normal.     Comments: Vaginal is long and mucosa is thin, atrophic.  Small amount of bleeding noted with placement of speculum.  Despite pt being on bedpan to elevate pelvis, cervix could not be visualize.  Procedure for endometrial biopsy not attempted.  Could not palpate cervix with bimanual exam either. Lymphadenopathy:     Lower Body: No right inguinal adenopathy. No left inguinal adenopathy.  Neurological:     Mental Status: She is alert.    Chaperone present for exam.  Assessment/Plan: 1. Postmenopausal bleeding (Primary) - inadequate evaluated to date - given more clear ultrasound findings today that are more consistent with a polyp, hysteroscopy with polyp resection, D&C recommended.  Procedure, risks and benefits reviewed.  Will send order for surgery and plan procedure.  Will coordinate with assisted living facility.    2. Abnormal ultrasound of endometrium

## 2023-10-11 ENCOUNTER — Telehealth (HOSPITAL_BASED_OUTPATIENT_CLINIC_OR_DEPARTMENT_OTHER): Payer: Self-pay

## 2023-10-11 NOTE — Telephone Encounter (Signed)
 Camilo Cella for [facility] called and stated patient was seen by Dr. Annabell Key on 5/14. At that visit it was determined that patient would need to be scheduled for [facility]. Patient has not been scheduled yet.   I reached out to [facility] our scheduler. She does not have a referral for patient to be scheduled. tbw

## 2023-10-12 ENCOUNTER — Other Ambulatory Visit (HOSPITAL_BASED_OUTPATIENT_CLINIC_OR_DEPARTMENT_OTHER): Payer: Self-pay | Admitting: Obstetrics & Gynecology

## 2023-10-12 DIAGNOSIS — N950 Postmenopausal bleeding: Secondary | ICD-10-CM

## 2023-10-12 DIAGNOSIS — R935 Abnormal findings on diagnostic imaging of other abdominal regions, including retroperitoneum: Secondary | ICD-10-CM

## 2023-10-13 NOTE — Telephone Encounter (Signed)
 Called facility to speak with [name] [name]. Per Dr. Annabell Key patient will need to have a recent A1C. tbw

## 2023-10-14 NOTE — Telephone Encounter (Signed)
 Called Jessica back at [phone]. Camilo Cella was not currently at her desk, however she will check to see if  patient has a recent Hgb A1c. If she does she will fax the results over to us . (Fax number provided) If she does not, the facility will make sure it is drawn and have the results faxed over to us . tbw

## 2023-11-12 ENCOUNTER — Telehealth: Payer: Self-pay

## 2023-11-12 NOTE — Telephone Encounter (Signed)
 I attempted to reach the patient to schedule surgery w/ Dr. Cleotilde in September. Patient phone number on file is connected to the medical facility she lives in. I was left on hold and unable to make contact with the patient.

## 2023-11-29 ENCOUNTER — Telehealth: Payer: Self-pay

## 2023-11-29 NOTE — Telephone Encounter (Signed)
 I called patients daughter to schedule patients surgery w/ Dr. Cleotilde on 12/28/23. The daughter agreed to have patient scheduled and will notify the facility of surgery details. Pre-Op instructions and surgery details provided by phone.

## 2023-12-22 NOTE — Progress Notes (Signed)
 Attempted to contact pt nurse Harlene at [facility]. Unable to reach or leave message.

## 2023-12-24 ENCOUNTER — Encounter (HOSPITAL_COMMUNITY): Payer: Self-pay | Admitting: Obstetrics & Gynecology

## 2023-12-24 NOTE — Progress Notes (Signed)
 After many failed attempts to contact nursing home [facility]) for preop instructions, this RN contacted pt daughter, instructions given to her and she verbalized understanding. States she would let the nursing home know instructions.    Spoke w/ via phone for pre-op interview--- Daughter [name] [name] Lab needs dos----  BMP, EKG A1C and CBG per anesthesia. None per surgeon.       Lab results------ COVID test -----patient states asymptomatic no test needed Arrive at -------0900 NPO after MN NO Solid Food.  Clear liquids from MN until---0800 Pre-Surgery Ensure or G2:  Med rec completed Medications to take morning of surgery ----- Atenolol , Gabapentin and Keppra  Diabetic medication -----  GLP1 agonist last dose: Mounjaro. Daughter unsure of pt last dose.  GLP1 instructions: Explained to pt daughter that if pt has had Mounjaro within the 7 days of surgery her surgery may be cancelled. Daughter verbalized understanding.  Patient instructed no nail polish to be worn day of surgery Patient instructed to bring photo id and insurance card day of surgery Patient aware to have Driver (ride ) / caregiver    for 24 hours after surgery - [employer] to transport. Daughter [name] [name]. Patient Special Instructions ----- Ask [facility] to send copy day of surgery of pt current medication admin. Record.   Pre-Op special Instructions ----- Per daughter patient is alert and oriented and signs her own consent.  Patient verbalized understanding of instructions that were given at this phone interview. Patient denies chest pain, sob, fever, cough at the interview.

## 2023-12-28 ENCOUNTER — Other Ambulatory Visit (HOSPITAL_BASED_OUTPATIENT_CLINIC_OR_DEPARTMENT_OTHER): Payer: Self-pay | Admitting: Obstetrics & Gynecology

## 2023-12-28 ENCOUNTER — Ambulatory Visit (HOSPITAL_COMMUNITY)
Admission: RE | Admit: 2023-12-28 | Discharge: 2023-12-28 | Disposition: A | Discharge status: Home / Self Care | Attending: Obstetrics & Gynecology | Admitting: Obstetrics & Gynecology

## 2023-12-28 ENCOUNTER — Encounter (HOSPITAL_COMMUNITY)
Admission: RE | Disposition: A | Discharge status: Home / Self Care | Payer: Self-pay | Source: Home / Self Care | Attending: Obstetrics & Gynecology

## 2023-12-28 ENCOUNTER — Ambulatory Visit (HOSPITAL_BASED_OUTPATIENT_CLINIC_OR_DEPARTMENT_OTHER)

## 2023-12-28 ENCOUNTER — Other Ambulatory Visit: Payer: Self-pay

## 2023-12-28 ENCOUNTER — Ambulatory Visit (HOSPITAL_COMMUNITY)

## 2023-12-28 ENCOUNTER — Encounter (HOSPITAL_COMMUNITY): Payer: Self-pay | Admitting: Obstetrics & Gynecology

## 2023-12-28 DIAGNOSIS — M1990 Unspecified osteoarthritis, unspecified site: Secondary | ICD-10-CM | POA: Diagnosis not present

## 2023-12-28 DIAGNOSIS — G709 Myoneural disorder, unspecified: Secondary | ICD-10-CM | POA: Diagnosis not present

## 2023-12-28 DIAGNOSIS — F32A Depression, unspecified: Secondary | ICD-10-CM

## 2023-12-28 DIAGNOSIS — N840 Polyp of corpus uteri: Secondary | ICD-10-CM | POA: Insufficient documentation

## 2023-12-28 DIAGNOSIS — Z79899 Other long term (current) drug therapy: Secondary | ICD-10-CM | POA: Diagnosis not present

## 2023-12-28 DIAGNOSIS — I69359 Hemiplegia and hemiparesis following cerebral infarction affecting unspecified side: Secondary | ICD-10-CM | POA: Insufficient documentation

## 2023-12-28 DIAGNOSIS — Z01818 Encounter for other preprocedural examination: Secondary | ICD-10-CM

## 2023-12-28 DIAGNOSIS — K219 Gastro-esophageal reflux disease without esophagitis: Secondary | ICD-10-CM | POA: Insufficient documentation

## 2023-12-28 DIAGNOSIS — I10 Essential (primary) hypertension: Secondary | ICD-10-CM | POA: Insufficient documentation

## 2023-12-28 DIAGNOSIS — E119 Type 2 diabetes mellitus without complications: Secondary | ICD-10-CM

## 2023-12-28 DIAGNOSIS — Z7985 Long-term (current) use of injectable non-insulin antidiabetic drugs: Secondary | ICD-10-CM | POA: Insufficient documentation

## 2023-12-28 DIAGNOSIS — Z8249 Family history of ischemic heart disease and other diseases of the circulatory system: Secondary | ICD-10-CM | POA: Insufficient documentation

## 2023-12-28 DIAGNOSIS — Z833 Family history of diabetes mellitus: Secondary | ICD-10-CM | POA: Insufficient documentation

## 2023-12-28 DIAGNOSIS — R9389 Abnormal findings on diagnostic imaging of other specified body structures: Secondary | ICD-10-CM | POA: Insufficient documentation

## 2023-12-28 DIAGNOSIS — N950 Postmenopausal bleeding: Secondary | ICD-10-CM | POA: Insufficient documentation

## 2023-12-28 DIAGNOSIS — K449 Diaphragmatic hernia without obstruction or gangrene: Secondary | ICD-10-CM | POA: Insufficient documentation

## 2023-12-28 DIAGNOSIS — Z7984 Long term (current) use of oral hypoglycemic drugs: Secondary | ICD-10-CM | POA: Insufficient documentation

## 2023-12-28 DIAGNOSIS — R935 Abnormal findings on diagnostic imaging of other abdominal regions, including retroperitoneum: Secondary | ICD-10-CM | POA: Diagnosis present

## 2023-12-28 HISTORY — PX: HYSTEROSCOPY WITH D & C: SHX1775

## 2023-12-28 LAB — BASIC METABOLIC PANEL WITH GFR
Anion gap: 8 (ref 5–15)
BUN: 18 mg/dL (ref 8–23)
CO2: 25 mmol/L (ref 22–32)
Calcium: 8.9 mg/dL (ref 8.9–10.3)
Chloride: 103 mmol/L (ref 98–111)
Creatinine, Ser: 0.51 mg/dL (ref 0.44–1.00)
GFR, Estimated: >60 mL/min (ref >60)
Glucose, Bld: 96 mg/dL (ref 70–99)
Potassium: 3.4 mmol/L — ABNORMAL LOW (ref 3.5–5.1)
Sodium: 136 mmol/L (ref 135–145)

## 2023-12-28 LAB — CBC
HCT: 37.7 % (ref 36.0–46.0)
Hemoglobin: 12.5 g/dL (ref 12.0–15.0)
MCH: 31.6 pg (ref 26.0–34.0)
MCHC: 33.2 g/dL (ref 30.0–36.0)
MCV: 95.4 fL (ref 80.0–100.0)
Platelets: UNDETERMINED K/uL (ref 150–400)
RBC: 3.95 MIL/uL (ref 3.87–5.11)
RDW: 12.8 % (ref 11.5–15.5)
WBC: 7.4 K/uL (ref 4.0–10.5)
nRBC: 0 % (ref 0.0–0.2)

## 2023-12-28 LAB — GLUCOSE, CAPILLARY
Glucose-Capillary: 95 mg/dL (ref 70–99)
Glucose-Capillary: 120 mg/dL — ABNORMAL HIGH (ref 70–99)

## 2023-12-28 LAB — HEMOGLOBIN A1C
Hgb A1c MFr Bld: 5.4 % (ref 4.8–5.6)
Mean Plasma Glucose: 108.28 mg/dL

## 2023-12-28 SURGERY — DILATATION AND CURETTAGE /HYSTEROSCOPY
Anesthesia: General | Site: Vagina

## 2023-12-28 MED ORDER — CHLORHEXIDINE GLUCONATE 0.12 % MT SOLN
15.0000 mL | Freq: Once | OROMUCOSAL | Status: AC
  Administered 2023-12-28: 15 mL via OROMUCOSAL

## 2023-12-28 MED ORDER — CHLORHEXIDINE GLUCONATE 0.12 % MT SOLN
OROMUCOSAL | Status: AC
  Filled 2023-12-28: qty 15

## 2023-12-28 MED ORDER — FENTANYL CITRATE (PF) 100 MCG/2ML IJ SOLN: 25.0000 ug | INTRAMUSCULAR | Status: DC | PRN

## 2023-12-28 MED ORDER — KETOROLAC TROMETHAMINE 30 MG/ML IJ SOLN
INTRAMUSCULAR | Status: DC | PRN
  Administered 2023-12-28: 30 mg via INTRAVENOUS

## 2023-12-28 MED ORDER — DEXAMETHASONE SODIUM PHOSPHATE 10 MG/ML IJ SOLN
INTRAMUSCULAR | Status: AC
  Filled 2023-12-28: qty 1

## 2023-12-28 MED ORDER — DEXAMETHASONE SODIUM PHOSPHATE 10 MG/ML IJ SOLN
INTRAMUSCULAR | Status: DC | PRN
  Administered 2023-12-28: 5 mg via INTRAVENOUS

## 2023-12-28 MED ORDER — LIDOCAINE 2% (20 MG/ML) 5 ML SYRINGE
INTRAMUSCULAR | Status: AC
  Filled 2023-12-28: qty 5

## 2023-12-28 MED ORDER — ACETAMINOPHEN 500 MG PO TABS
1000.0000 mg | ORAL_TABLET | Freq: Once | ORAL | Status: AC
  Administered 2023-12-28: 1000 mg via ORAL

## 2023-12-28 MED ORDER — ROCURONIUM BROMIDE 10 MG/ML (PF) SYRINGE
PREFILLED_SYRINGE | INTRAVENOUS | Status: AC
  Filled 2023-12-28: qty 10

## 2023-12-28 MED ORDER — ORAL CARE MOUTH RINSE: 15.0000 mL | Freq: Once | OROMUCOSAL | Status: AC

## 2023-12-28 MED ORDER — SODIUM CHLORIDE 0.9 % IR SOLN
Status: DC | PRN
  Administered 2023-12-28: 3000 mL

## 2023-12-28 MED ORDER — LACTATED RINGERS IV SOLN: INTRAVENOUS | Status: DC

## 2023-12-28 MED ORDER — ONDANSETRON HCL 4 MG/2ML IJ SOLN
INTRAMUSCULAR | Status: AC
  Filled 2023-12-28: qty 2

## 2023-12-28 MED ORDER — INSULIN ASPART 100 UNIT/ML IJ SOLN: 0.0000 [IU] | INTRAMUSCULAR | Status: DC | PRN

## 2023-12-28 MED ORDER — ONDANSETRON HCL 4 MG/2ML IJ SOLN
INTRAMUSCULAR | Status: DC | PRN
  Administered 2023-12-28: 4 mg via INTRAVENOUS

## 2023-12-28 MED ORDER — PHENYLEPHRINE 80 MCG/ML (10ML) SYRINGE FOR IV PUSH (FOR BLOOD PRESSURE SUPPORT)
PREFILLED_SYRINGE | INTRAVENOUS | Status: DC | PRN
  Administered 2023-12-28: 160 ug via INTRAVENOUS

## 2023-12-28 MED ORDER — PROPOFOL 10 MG/ML IV BOLUS
INTRAVENOUS | Status: AC
  Filled 2023-12-28: qty 20

## 2023-12-28 MED ORDER — FENTANYL CITRATE (PF) 250 MCG/5ML IJ SOLN
INTRAMUSCULAR | Status: DC | PRN
  Administered 2023-12-28 (×2): 50 ug via INTRAVENOUS

## 2023-12-28 MED ORDER — DROPERIDOL 2.5 MG/ML IJ SOLN: 0.6250 mg | Freq: Once | INTRAMUSCULAR | Status: DC | PRN

## 2023-12-28 MED ORDER — PROPOFOL 10 MG/ML IV BOLUS
INTRAVENOUS | Status: DC | PRN
  Administered 2023-12-28: 150 mg via INTRAVENOUS

## 2023-12-28 MED ORDER — POVIDONE-IODINE 10 % EX SWAB: 2.0000 | Freq: Once | CUTANEOUS | Status: DC

## 2023-12-28 MED ORDER — FENTANYL CITRATE (PF) 250 MCG/5ML IJ SOLN
INTRAMUSCULAR | Status: AC
  Filled 2023-12-28: qty 5

## 2023-12-28 MED ORDER — ACETAMINOPHEN 500 MG PO TABS
ORAL_TABLET | ORAL | Status: AC
  Filled 2023-12-28: qty 2

## 2023-12-28 MED ORDER — LIDOCAINE 2% (20 MG/ML) 5 ML SYRINGE
INTRAMUSCULAR | Status: DC | PRN
  Administered 2023-12-28: 100 mg via INTRAVENOUS

## 2023-12-28 SURGICAL SUPPLY — 18 items
CANISTER SUCTION 3000ML PPV (SUCTIONS) ×2 IMPLANT
CATH ROBINSON RED A/P 16FR (CATHETERS) ×2 IMPLANT
COVER MAYO STAND STRL (DRAPES) ×2 IMPLANT
CURETTE PIPELLE ENDOMTRL SUCTN (MISCELLANEOUS) IMPLANT
DEVICE MYOSURE LITE (MISCELLANEOUS) IMPLANT
DEVICE MYOSURE REACH (MISCELLANEOUS) IMPLANT
DILATOR CANAL MILEX (MISCELLANEOUS) IMPLANT
GLOVE ECLIPSE 6.5 STRL STRAW (GLOVE) ×2 IMPLANT
GLOVE SURG UNDER POLY LF SZ7 (GLOVE) ×4 IMPLANT
GOWN STRL REUS W/ TWL LRG LVL3 (GOWN DISPOSABLE) ×4 IMPLANT
KIT PROCEDURE FLUENT (KITS) ×2 IMPLANT
KIT TURNOVER KIT B (KITS) ×2 IMPLANT
PACK VAGINAL MINOR WOMEN LF (CUSTOM PROCEDURE TRAY) ×2 IMPLANT
PAD OB MATERNITY 11 LF (PERSONAL CARE ITEMS) ×2 IMPLANT
SEAL ROD LENS SCOPE MYOSURE (ABLATOR) ×2 IMPLANT
SOL .9 NS 3000ML IRR UROMATIC (IV SOLUTION) IMPLANT
TOWEL GREEN STERILE FF (TOWEL DISPOSABLE) ×4 IMPLANT
UNDERPAD 30X36 HEAVY ABSORB (UNDERPADS AND DIAPERS) ×2 IMPLANT

## 2023-12-28 NOTE — Anesthesia Procedure Notes (Signed)
 Procedure Name: LMA Insertion Date/Time: 12/28/2023 10:46 AM  Performed by: Viviana Almarie DASEN, CRNAPre-anesthesia Checklist: Patient identified, Emergency Drugs available, Suction available and Patient being monitored Patient Re-evaluated:Patient Re-evaluated prior to induction Oxygen Delivery Method: Circle System Utilized Preoxygenation: Pre-oxygenation with 100% oxygen Induction Type: IV induction Ventilation: Mask ventilation without difficulty LMA: LMA inserted LMA Size: 4.0 Tube type: Oral Number of attempts: 1 Airway Equipment and Method: Bite block Placement Confirmation: positive ETCO2 Tube secured with: Tape Dental Injury: Teeth and Oropharynx as per pre-operative assessment

## 2023-12-28 NOTE — Transfer of Care (Addendum)
 Immediate Anesthesia Transfer of Care Note  Patient: [name] [name]  Procedure(s) Performed: DILATATION AND CURETTAGE /HYSTEROSCOPY/ PAP SMEAR/ POLYP RESECTION (Vagina )  Patient Location: PACU  Anesthesia Type:General  Level of Consciousness: awake, alert , oriented, and patient cooperative  Airway & Oxygen Therapy: Patient Spontanous Breathing  Post-op Assessment: Report given to RN, Post -op Vital signs reviewed and stable, Patient moving all extremities X 4, and Patient able to stick tongue midline  Post vital signs: Reviewed and stable  Last Vitals:  Vitals Value Taken Time  BP 138/72 12/28/23 11:51  Temp 97.5   Pulse 85 12/28/23 11:53  Resp 17 12/28/23 11:53  SpO2 95 % 12/28/23 11:53  Vitals shown include unfiled device data.  Last Pain:  Vitals:   12/28/23 0938  TempSrc: Oral  PainSc: 0-No pain      Patients Stated Pain Goal: 7 (12/28/23 9061)  Complications: No notable events documented.

## 2023-12-28 NOTE — Op Note (Signed)
 12/28/2023  12:14 PM  PATIENT:  [name] [name]  68 y.o. female  PRE-OPERATIVE DIAGNOSIS:  Post-menopausal bleeding Polyp abnormal endometrium  POST-OPERATIVE DIAGNOSIS:  Post-menopausal bleeding Polyp abnormal endometrium  PROCEDURE:  Procedure(s): DILATATION AND CURETTAGE /HYSTEROSCOPY/ PAP SMEAR/ POLYP RESECTION  SURGEON:  Ronal GORMAN Pinal  ASSISTANTS: OR staff.  An experienced assistant was required given the standard of surgical care given the complexity of the case.  This assistant was needed for exposure, dissection, suctioning, retraction, instrument exchange and for overall help during the procedure.  RNFA help was also unavailable.  ANESTHESIA:   general  ESTIMATED BLOOD LOSS: 5 mL  BLOOD ADMINISTERED:none   FLUIDS: 500cc LR  UOP: 200cc concentrated urine  SPECIMEN:  endometrial polyps  DISPOSITION OF SPECIMEN:  PATHOLOGY  FINDINGS: several endometrial polyps, thin endometrium  DESCRIPTION OF OPERATION: Patient was taken to the operating room.  She is placed in the supine position. SCDs were on her lower extremities and functioning properly. General anesthesia with an LMA was administered without difficulty. Dr. Darlyn, anesthesia, oversaw case.  Legs were then placed in the [facility] stirrups in the low lithotomy position. The legs were lifted to the high lithotomy position and the Betadine  prep was used on the inner thighs perineum and vagina x3. Patient was draped in a normal standard fashion.  A bivalve speculum was placed the vagina. There was some difficulty visualizing the cervix due to the location.  With fundal pressure, it was much more visible.  The anterior lip of the cervix was grasped with single-tooth tenaculum.  The cervix is dilated up to #17 [facility] dilators. The endometrial cavity sounded to 8 cm.   A 2.9 millimeter diagnostic hysteroscope was obtained. Normal saline was used as a hysteroscopic fluid. The hysteroscope was advanced through the endocervical  canal into the endometrial cavity. The tubal ostia were noted bilaterally. Additional findings included partial septum and several polyps.  The hysteroscope was removed and a 5.27mm hysteroscope was obtained.  Using the [facility] reach device, all polyps were removed.  Photodocumentaiton was obtained.  Due to the septum, no curettage was performed.  At this point no other procedure was needed and this procedure was ended. The hysteroscope was removed. The fluid deficit was 75 cc. The tenaculum was removed from the anterior lip of the cervix. The speculum was removed from the vagina. The prep was cleansed of the patient's skin. The legs are positioned back in the supine position. Sponge, lap, needle, instrument counts were correct x2. Patient was taken to recovery in stable condition.   COUNTS:  YES  PLAN OF CARE: Transfer to PACU

## 2023-12-28 NOTE — Anesthesia Preprocedure Evaluation (Addendum)
 Anesthesia Evaluation  Patient identified by MRN, date of birth, ID band Patient awake    Reviewed: Allergy & Precautions, NPO status , Patient's Chart, lab work & pertinent test results, reviewed documented beta blocker date and time   Airway Mallampati: III  TM Distance: >3 FB Neck ROM: Full    Dental  (+) Dental Advisory Given   Pulmonary neg pulmonary ROS   Pulmonary exam normal breath sounds clear to auscultation       Cardiovascular hypertension, Pt. on home beta blockers Normal cardiovascular exam Rhythm:Regular Rate:Normal     Neuro/Psych Seizures -,  PSYCHIATRIC DISORDERS  Depression     Neuromuscular disease CVA, Residual Symptoms    GI/Hepatic Neg liver ROS, hiatal hernia,GERD  Controlled and Medicated,,  Endo/Other  diabetes    Renal/GU negative Renal ROS     Musculoskeletal  (+) Arthritis ,    Abdominal  (+) + obese  Peds  Hematology  (+) Blood dyscrasia, anemia   Anesthesia Other Findings   Reproductive/Obstetrics                              Anesthesia Physical Anesthesia Plan  ASA: 3  Anesthesia Plan: General   Post-op Pain Management: Tylenol  PO (pre-op)*   Induction:   PONV Risk Score and Plan: 4 or greater and Ondansetron , Treatment may vary due to age or medical condition and Dexamethasone   Airway Management Planned: LMA  Additional Equipment:   Intra-op Plan:   Post-operative Plan: Extubation in OR  Informed Consent: I have reviewed the patients History and Physical, chart, labs and discussed the procedure including the risks, benefits and alternatives for the proposed anesthesia with the patient or authorized representative who has indicated his/her understanding and acceptance.     Dental advisory given  Plan Discussed with: CRNA  Anesthesia Plan Comments:          Anesthesia Quick Evaluation

## 2023-12-28 NOTE — H&P (Signed)
 [name] [name] is an 68 y.o. 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  Ultrasound showed fibroids uterus but endometrium thickened about 1.0cm.  Given findings on ultrasound and inability to obtain sample in office, hysteroscopy in the OR with removal intracavity lesion(s) and D&C.  Procedure discussed with patient.  Recovery and pain management discussed.  Risks discussed including but not limited to bleeding, rare risk of transfusion, infection, 1% risk of uterine perforation with risks of fluid deficit causing cardiac arrythmia, cerebral swelling and/or need to stop procedure early.  Fluid emboli and rare risk of death discussed.  DVT/PE, rare risk of risk of bowel/bladder/ureteral/vascular injury.  Patient aware if pathology abnormal she may need additional treatment.  All questions answered.    Pertinent Gynecological History: Menses: post-menopausal Bleeding: post menopausal bleeding Contraception: post menopausal status DES exposure: denies Blood transfusions: none Sexually transmitted diseases: no past history Previous GYN Procedures: c section x 2  Last mammogram: normal Date: 07/13/2023 Last pap: need to obtain today OB History: G2, P2   Menstrual History: Patient's last menstrual period was 06/24/2013.    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   Stroke (HCC) 03/1988   Uterine fibroid     Past Surgical History:  Procedure Laterality  Date   BREAST SURGERY  04/28/1975   CESAREAN SECTION  1983   , and repeat in 1984   COLONOSCOPY  04/27/2006   TRACHEOSTOMY     TRACHEOSTOMY CLOSURE     TUBAL LIGATION  1984   UPPER GASTROINTESTINAL ENDOSCOPY  04/27/2006    Family History  Problem Relation Age of Onset   Heart disease Paternal Grandfather    Hypertension Paternal Grandfather    Prostate cancer Paternal Uncle    Ovarian cancer Mother    Hypertension Maternal Grandmother    Stroke Maternal Grandmother    Diabetes Brother    Heart attack Brother    Diabetes Cousin    Colon cancer Neg Hx    Breast cancer Neg Hx     Social History:  reports that she has never smoked. She has never used smokeless tobacco. She reports that she does not drink alcohol and does not use drugs.  Allergies:  Allergies  Allergen Reactions   Amoxicillin Itching   Cephalexin Itching   Penicillins Itching    Medications Prior to Admission  Medication Sig Dispense Refill Last Dose/Taking   atenolol  (TENORMIN ) 50 MG tablet Take 50 mg by mouth daily.   12/27/2023 Morning   atorvastatin  (LIPITOR) 20 MG tablet Take 20 mg by mouth daily.   12/27/2023 Evening   Cholecalciferol 25 MCG (1000 UT) CHEW Chew 25 each by mouth daily.   12/27/2023   gabapentin (NEURONTIN) 100 MG capsule Take 100 mg by mouth 2 (two) times daily.   12/27/2023 Evening   levETIRAcetam  (KEPPRA ) 1000 MG tablet Take 1,000 mg by  mouth 2 (two) times daily. For conversion disorder with seizures or convulsions.   12/27/2023 Evening   lisinopril -hydrochlorothiazide (ZESTORETIC) 20-12.5 MG tablet Take 1 tablet by mouth 2 (two) times daily.   12/27/2023   Menthol, Topical Analgesic, (BIOFREEZE) 4 % GEL Apply 1 application topically 2 (two) times daily as needed (Apply to knees).   12/27/2023   mineral oil-hydrophilic petrolatum  (AQUAPHOR) ointment Apply topically as needed for dry skin.   12/27/2023   Multiple Vitamins-Minerals (CERTAVITE SENIOR/ANTIOXIDANT) TABS Take 18-400 mg by mouth daily.    12/27/2023   Olopatadine HCl (PATADAY) 0.2 % SOLN Apply to eye.   12/27/2023   ondansetron  (ZOFRAN ) 4 MG tablet Take 4 mg by mouth every 6 (six) hours as needed for nausea or vomiting.   Past Month   polyethylene glycol (MIRALAX  / GLYCOLAX ) packet Take 17 g by mouth every 8 (eight) hours as needed.    Past Week   potassium chloride  SA (K-DUR,KLOR-CON ) 20 MEQ tablet Take 20 mEq by mouth daily.   12/27/2023   senna (SENOKOT) 8.6 MG tablet Take 2 tablets by mouth at bedtime.    Past Week   aspirin  EC 81 MG tablet Take 81 mg by mouth daily. Swallow whole.   12/24/2023 Morning   calcium  carbonate (OS-CAL) 1250 (500 Ca) MG chewable tablet Chew 1 tablet by mouth daily.   More than a month   lidocaine  4 % dressing Apply topically.   Unknown   loratadine (CLARITIN) 10 MG tablet Take 10 mg by mouth every morning. For allergies.   Unknown   metFORMIN  (GLUCOPHAGE ) 500 MG tablet Take 500 mg by mouth daily with breakfast.   Unknown   Misc. Devices [facility]) MISC by Does not apply route.      MOUNJARO 7.5 MG/0.5ML Pen Inject 7.5 mg into the skin once a week.   12/20/2023   ondansetron  (ZOFRAN -ODT) 4 MG disintegrating tablet Take 4 mg by mouth every 6 (six) hours as needed.      PROBIOTIC, LACTOBACILLUS, PO Take by mouth.   Unknown    Review of Systems  Constitutional: Negative.   Respiratory: Negative.    Cardiovascular: Negative.   Genitourinary: Negative.   Musculoskeletal: Negative.     Blood pressure 122/83, pulse 75, temperature 97.6 F (36.4 C), temperature source Oral, resp. rate 17, height 5' 7 (1.702 m), weight 88.5 kg, last menstrual period 06/24/2013, SpO2 94%. Physical Exam Constitutional:      Appearance: Normal appearance.  Cardiovascular:     Rate and Rhythm: Normal rate and regular rhythm.  Pulmonary:     Effort: Pulmonary effort is normal.     Breath sounds: Normal breath sounds.  Neurological:     General: No focal deficit present.     Mental Status: She is alert.  Psychiatric:         Mood and Affect: Mood normal.        Behavior: Behavior normal.     Results for orders placed or performed during the hospital encounter of 12/28/23 (from the past 24 hours)  Glucose, capillary     Status: None   Collection Time: 12/28/23  9:55 AM  Result Value Ref Range   Glucose-Capillary 95 70 - 99 mg/dL    No results found.  Assessment/Plan: 68 you G2P2 AAfemale with hemiplegia, PMP bleeding, thickened endometrium, fibroid uterus here for hysteroscopy, endometrial sampling and possible removal of endometrial lesions.  Procedure reviewed, risks and benefits discussed.  Questions answered.  Pt ready to proceed.  Ronal GORMAN Pinal 12/28/2023, 10:22 AM

## 2023-12-28 NOTE — Discharge Instructions (Addendum)
 Post-surgical Instructions, Outpatient Surgery  You may expect to feel dizzy, weak, and drowsy for as long as 24 hours after receiving the medicine that made you sleep (anesthetic). For the first 24 hours after your surgery:   Do not drive a car, ride a bicycle, participate in physical activities, or take public transportation until you are done taking narcotic pain medicines or as directed by Dr. Cleotilde.  Do not drink alcohol or take tranquilizers.  Do not take medicine that has not been prescribed by your physicians.  Do not sign important papers or make important decisions while on narcotic pain medicines.  Have a responsible person with you.   PAIN MANAGEMENT Order for Motrin 800mg  and Vicodin placed on your post procedure orders.    DO'S AND DON'T'S Do not put anything in the vagina for two weeks--no tampons, intercourse, or douching.    REGULAR MEDIATIONS/VITAMINS: You may restart all of your regular medications as prescribed. You may restart all of your vitamins as you normally take them.    PLEASE CALL OR SEEK MEDICAL CARE IF: You have persistent nausea and vomiting.  You have trouble eating or drinking.  You have an oral temperature above 100.5.  You have constipation that is not helped by adjusting diet or increasing fluid intake. Pain medicines are a common cause of constipation.  You have heavy vaginal bleeding  Post Anesthesia Home Care Instructions  Activity: Get plenty of rest for the remainder of the day. A responsible individual must stay with you for 24 hours following the procedure.  For the next 24 hours, DO NOT: -Drive a car -Advertising copywriter -Drink alcoholic beverages -Take any medication unless instructed by your physician -Make any legal decisions or sign important papers.  Meals: Start with liquid foods such as gelatin or soup. Progress to regular foods as tolerated. Avoid greasy, spicy, heavy foods. If nausea and/or vomiting occur, drink only clear  liquids until the nausea and/or vomiting subsides. Call your physician if vomiting continues.  Special Instructions/Symptoms: Your throat may feel dry or sore from the anesthesia or the breathing tube placed in your throat during surgery. If this causes discomfort, gargle with warm salt water. The discomfort should disappear within 24 hours.  Tylenol  given at 0942 preop. May take next dose at 4 PM as needed for cramping/soreness.

## 2023-12-29 ENCOUNTER — Encounter (HOSPITAL_COMMUNITY): Payer: Self-pay | Admitting: Obstetrics & Gynecology

## 2023-12-30 LAB — SURGICAL PATHOLOGY

## 2023-12-30 LAB — CYTOLOGY - PAP
Adequacy: ABSENT
Diagnosis: NEGATIVE
Diagnosis: REACTIVE

## 2023-12-30 NOTE — Anesthesia Postprocedure Evaluation (Signed)
 Anesthesia Post Note  Patient: [name] [name]  Procedure(s) Performed: DILATATION AND CURETTAGE /HYSTEROSCOPY/ PAP SMEAR/ POLYP RESECTION (Vagina )     Patient location during evaluation: PACU Anesthesia Type: General Level of consciousness: sedated and patient cooperative Pain management: pain level controlled Vital Signs Assessment: post-procedure vital signs reviewed and stable Respiratory status: spontaneous breathing Cardiovascular status: stable Anesthetic complications: no   No notable events documented.  Last Vitals:  Vitals:   12/28/23 1300 12/28/23 1330  BP: (!) 134/91 126/78  Pulse: 74 67  Resp: 15 13  Temp:  36.4 C  SpO2: 97% 96%    Last Pain:  Vitals:   12/28/23 1330  TempSrc:   PainSc: 0-No pain                 Norleen Pope

## 2024-01-04 ENCOUNTER — Ambulatory Visit (HOSPITAL_BASED_OUTPATIENT_CLINIC_OR_DEPARTMENT_OTHER): Payer: Self-pay | Admitting: Obstetrics & Gynecology

## 2024-01-27 ENCOUNTER — Ambulatory Visit (INDEPENDENT_AMBULATORY_CARE_PROVIDER_SITE_OTHER): Payer: Self-pay | Admitting: Obstetrics & Gynecology

## 2024-01-27 ENCOUNTER — Encounter (HOSPITAL_BASED_OUTPATIENT_CLINIC_OR_DEPARTMENT_OTHER): Payer: Self-pay | Admitting: Obstetrics & Gynecology

## 2024-01-27 VITALS — BP 127/80 | HR 69

## 2024-01-27 DIAGNOSIS — N840 Polyp of corpus uteri: Secondary | ICD-10-CM

## 2024-01-27 NOTE — Progress Notes (Signed)
 GYNECOLOGY  VISIT  CC:   post op recheck  HPI: 68 y.o. G22P2000 Divorced Black or [place] American female here for recheck after undergoing Dilatation and Curettage/Hysteroscopy on 12/28/2023.  Reports she had some bleeding for a day or two after the surgery.  This has all stopped.  Denies bleeding now.   She has no pain.  She has chronic constipation.  No urinary issues.  Images from surgery and pathology reviewed.   Benign endometrial polyps seen.  Pap smear was negative for any abnormal cells.  Questions answered.    MEDS:   Current Outpatient Medications on File Prior to Visit  Medication Sig Dispense Refill   aspirin  EC 81 MG tablet Take 81 mg by mouth daily. Swallow whole.     atenolol  (TENORMIN ) 50 MG tablet Take 50 mg by mouth daily.     atorvastatin  (LIPITOR) 20 MG tablet Take 20 mg by mouth daily.     calcium  carbonate (OS-CAL) 1250 (500 Ca) MG chewable tablet Chew 1 tablet by mouth daily.     Cholecalciferol 25 MCG (1000 UT) CHEW Chew 25 each by mouth daily.     gabapentin (NEURONTIN) 100 MG capsule Take 100 mg by mouth 2 (two) times daily.     levETIRAcetam  (KEPPRA ) 1000 MG tablet Take 1,000 mg by mouth 2 (two) times daily. For conversion disorder with seizures or convulsions.     lidocaine  4 % dressing Apply topically.     lisinopril -hydrochlorothiazide (ZESTORETIC) 20-12.5 MG tablet Take 1 tablet by mouth 2 (two) times daily.     loratadine (CLARITIN) 10 MG tablet Take 10 mg by mouth every morning. For allergies.     Menthol, Topical Analgesic, (BIOFREEZE) 4 % GEL Apply 1 application topically 2 (two) times daily as needed (Apply to knees).     metFORMIN  (GLUCOPHAGE ) 500 MG tablet Take 500 mg by mouth daily with breakfast.     mineral oil-hydrophilic petrolatum  (AQUAPHOR) ointment Apply topically as needed for dry skin.     Misc. Devices [facility]) MISC by Does not apply route.     MOUNJARO 7.5 MG/0.5ML Pen Inject 7.5 mg into the skin once a week.     Multiple  Vitamins-Minerals (CERTAVITE SENIOR/ANTIOXIDANT) TABS Take 18-400 mg by mouth daily.     Olopatadine HCl (PATADAY) 0.2 % SOLN Apply to eye.     ondansetron  (ZOFRAN ) 4 MG tablet Take 4 mg by mouth every 6 (six) hours as needed for nausea or vomiting.     ondansetron  (ZOFRAN -ODT) 4 MG disintegrating tablet Take 4 mg by mouth every 6 (six) hours as needed.     polyethylene glycol (MIRALAX  / GLYCOLAX ) packet Take 17 g by mouth every 8 (eight) hours as needed.      potassium chloride  SA (K-DUR,KLOR-CON ) 20 MEQ tablet Take 20 mEq by mouth daily.     PROBIOTIC, LACTOBACILLUS, PO Take by mouth.     senna (SENOKOT) 8.6 MG tablet Take 2 tablets by mouth at bedtime.      No current facility-administered medications on file prior to visit.    SH:  Smoking No    PHYSICAL EXAMINATION:    BP 127/80 (BP Location: Right Arm, Patient Position: Sitting, Cuff Size: Large)   Pulse 69   LMP 06/24/2013   SpO2 97%     Physical Exam Constitutional:      Appearance: Normal appearance.  Neurological:     General: No focal deficit present.     Mental Status: She is alert.  Psychiatric:  Mood and Affect: Mood normal.        Behavior: Behavior normal.     Assessment/Plan: 1. Endometrial polyp (Primary) - pt aware findings were all benign.  She is advised to let me know if she has any more bleeding.  However, it would not be worrisome if she had some light spotting over the next 2-3 months.  Questions answered.
